# Patient Record
Sex: Female | Born: 1980 | Race: Black or African American | Hispanic: No | Marital: Single | Smoking: Never smoker
Health system: Southern US, Community
[De-identification: ages and names within clinical notes are randomized; demographics above are authoritative.]

## PROBLEM LIST (undated history)

## (undated) DIAGNOSIS — N200 Calculus of kidney: Secondary | ICD-10-CM

## (undated) DIAGNOSIS — D649 Anemia, unspecified: Secondary | ICD-10-CM

## (undated) DIAGNOSIS — E119 Type 2 diabetes mellitus without complications: Secondary | ICD-10-CM

## (undated) HISTORY — PX: WISDOM TOOTH EXTRACTION: SHX21

---
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
## 1999-02-23 ENCOUNTER — Inpatient Hospital Stay (HOSPITAL_COMMUNITY): Admission: AD | Admit: 1999-02-23 | Discharge: 1999-02-23 | Payer: Self-pay | Admitting: Obstetrics

## 1999-03-07 ENCOUNTER — Inpatient Hospital Stay (HOSPITAL_COMMUNITY): Admission: AD | Admit: 1999-03-07 | Discharge: 1999-03-07 | Payer: Self-pay | Admitting: Obstetrics

## 1999-03-29 ENCOUNTER — Inpatient Hospital Stay (HOSPITAL_COMMUNITY): Admission: AD | Admit: 1999-03-29 | Discharge: 1999-03-29 | Payer: Self-pay | Admitting: *Deleted

## 1999-04-26 ENCOUNTER — Inpatient Hospital Stay (HOSPITAL_COMMUNITY): Admission: AD | Admit: 1999-04-26 | Discharge: 1999-04-26 | Payer: Self-pay | Admitting: *Deleted

## 2000-04-20 ENCOUNTER — Inpatient Hospital Stay (HOSPITAL_COMMUNITY): Admission: AD | Admit: 2000-04-20 | Discharge: 2000-04-20 | Payer: Self-pay | Admitting: *Deleted

## 2000-05-30 ENCOUNTER — Encounter: Payer: Self-pay | Admitting: Obstetrics and Gynecology

## 2000-05-30 ENCOUNTER — Ambulatory Visit (HOSPITAL_COMMUNITY): Admission: RE | Admit: 2000-05-30 | Discharge: 2000-05-30 | Payer: Self-pay | Admitting: Obstetrics and Gynecology

## 2000-09-26 ENCOUNTER — Observation Stay (HOSPITAL_COMMUNITY): Admission: AD | Admit: 2000-09-26 | Discharge: 2000-09-27 | Payer: Self-pay | Admitting: Obstetrics and Gynecology

## 2000-09-26 ENCOUNTER — Encounter: Payer: Self-pay | Admitting: Emergency Medicine

## 2000-09-27 ENCOUNTER — Observation Stay (HOSPITAL_COMMUNITY): Admission: AD | Admit: 2000-09-27 | Discharge: 2000-09-28 | Payer: Self-pay | Admitting: Obstetrics and Gynecology

## 2000-09-29 ENCOUNTER — Inpatient Hospital Stay (HOSPITAL_COMMUNITY): Admission: AD | Admit: 2000-09-29 | Discharge: 2000-09-29 | Payer: Self-pay | Admitting: Obstetrics and Gynecology

## 2000-10-04 ENCOUNTER — Inpatient Hospital Stay (HOSPITAL_COMMUNITY): Admission: AD | Admit: 2000-10-04 | Discharge: 2000-10-07 | Payer: Self-pay | Admitting: Obstetrics

## 2001-04-07 ENCOUNTER — Encounter: Payer: Self-pay | Admitting: Emergency Medicine

## 2001-04-07 ENCOUNTER — Emergency Department (HOSPITAL_COMMUNITY): Admission: EM | Admit: 2001-04-07 | Discharge: 2001-04-07 | Payer: Self-pay | Admitting: Emergency Medicine

## 2001-06-25 ENCOUNTER — Emergency Department (HOSPITAL_COMMUNITY): Admission: EM | Admit: 2001-06-25 | Discharge: 2001-06-25 | Payer: Self-pay | Admitting: Emergency Medicine

## 2001-07-08 ENCOUNTER — Emergency Department (HOSPITAL_COMMUNITY): Admission: EM | Admit: 2001-07-08 | Discharge: 2001-07-08 | Payer: Self-pay | Admitting: Emergency Medicine

## 2001-09-02 ENCOUNTER — Emergency Department (HOSPITAL_COMMUNITY): Admission: EM | Admit: 2001-09-02 | Discharge: 2001-09-02 | Payer: Self-pay | Admitting: Emergency Medicine

## 2001-09-05 ENCOUNTER — Emergency Department (HOSPITAL_COMMUNITY): Admission: EM | Admit: 2001-09-05 | Discharge: 2001-09-05 | Payer: Self-pay | Admitting: Emergency Medicine

## 2001-12-02 ENCOUNTER — Other Ambulatory Visit: Admission: RE | Admit: 2001-12-02 | Discharge: 2001-12-02 | Payer: Self-pay | Admitting: Obstetrics and Gynecology

## 2001-12-31 ENCOUNTER — Other Ambulatory Visit: Admission: RE | Admit: 2001-12-31 | Discharge: 2001-12-31 | Payer: Self-pay | Admitting: Obstetrics and Gynecology

## 2002-02-02 ENCOUNTER — Inpatient Hospital Stay (HOSPITAL_COMMUNITY): Admission: AD | Admit: 2002-02-02 | Discharge: 2002-02-02 | Payer: Self-pay | Admitting: Obstetrics and Gynecology

## 2002-02-18 ENCOUNTER — Inpatient Hospital Stay (HOSPITAL_COMMUNITY): Admission: AD | Admit: 2002-02-18 | Discharge: 2002-02-18 | Payer: Self-pay | Admitting: Obstetrics and Gynecology

## 2002-05-25 ENCOUNTER — Inpatient Hospital Stay (HOSPITAL_COMMUNITY): Admission: AD | Admit: 2002-05-25 | Discharge: 2002-05-25 | Payer: Self-pay | Admitting: Obstetrics and Gynecology

## 2002-06-05 ENCOUNTER — Inpatient Hospital Stay (HOSPITAL_COMMUNITY): Admission: AD | Admit: 2002-06-05 | Discharge: 2002-06-08 | Payer: Self-pay | Admitting: Obstetrics and Gynecology

## 2002-08-27 ENCOUNTER — Other Ambulatory Visit: Admission: RE | Admit: 2002-08-27 | Discharge: 2002-08-27 | Payer: Self-pay | Admitting: Obstetrics and Gynecology

## 2002-10-30 ENCOUNTER — Emergency Department (HOSPITAL_COMMUNITY): Admission: EM | Admit: 2002-10-30 | Discharge: 2002-10-30 | Payer: Self-pay | Admitting: Emergency Medicine

## 2003-02-08 ENCOUNTER — Emergency Department (HOSPITAL_COMMUNITY): Admission: EM | Admit: 2003-02-08 | Discharge: 2003-02-08 | Payer: Self-pay | Admitting: Emergency Medicine

## 2003-09-14 ENCOUNTER — Emergency Department (HOSPITAL_COMMUNITY): Admission: EM | Admit: 2003-09-14 | Discharge: 2003-09-15 | Payer: Self-pay | Admitting: Emergency Medicine

## 2003-12-07 ENCOUNTER — Emergency Department (HOSPITAL_COMMUNITY): Admission: EM | Admit: 2003-12-07 | Discharge: 2003-12-07 | Payer: Self-pay | Admitting: Emergency Medicine

## 2004-01-05 ENCOUNTER — Emergency Department (HOSPITAL_COMMUNITY): Admission: EM | Admit: 2004-01-05 | Discharge: 2004-01-06 | Payer: Self-pay | Admitting: Emergency Medicine

## 2004-01-17 ENCOUNTER — Inpatient Hospital Stay (HOSPITAL_COMMUNITY): Admission: AD | Admit: 2004-01-17 | Discharge: 2004-01-17 | Payer: Self-pay | Admitting: Obstetrics

## 2004-05-25 ENCOUNTER — Inpatient Hospital Stay (HOSPITAL_COMMUNITY): Admission: AD | Admit: 2004-05-25 | Discharge: 2004-05-25 | Payer: Self-pay | Admitting: Obstetrics

## 2004-05-31 ENCOUNTER — Inpatient Hospital Stay (HOSPITAL_COMMUNITY): Admission: RE | Admit: 2004-05-31 | Discharge: 2004-06-03 | Payer: Self-pay | Admitting: Obstetrics

## 2004-05-31 ENCOUNTER — Encounter (INDEPENDENT_AMBULATORY_CARE_PROVIDER_SITE_OTHER): Payer: Self-pay | Admitting: Specialist

## 2004-11-14 ENCOUNTER — Emergency Department (HOSPITAL_COMMUNITY): Admission: EM | Admit: 2004-11-14 | Discharge: 2004-11-14 | Payer: Self-pay | Admitting: Emergency Medicine

## 2005-01-06 ENCOUNTER — Emergency Department (HOSPITAL_COMMUNITY): Admission: EM | Admit: 2005-01-06 | Discharge: 2005-01-06 | Payer: Self-pay | Admitting: Family Medicine

## 2005-05-07 ENCOUNTER — Emergency Department (HOSPITAL_COMMUNITY): Admission: EM | Admit: 2005-05-07 | Discharge: 2005-05-07 | Payer: Self-pay | Admitting: Emergency Medicine

## 2005-06-05 ENCOUNTER — Emergency Department (HOSPITAL_COMMUNITY): Admission: EM | Admit: 2005-06-05 | Discharge: 2005-06-05 | Payer: Self-pay | Admitting: Emergency Medicine

## 2005-07-09 ENCOUNTER — Emergency Department (HOSPITAL_COMMUNITY): Admission: EM | Admit: 2005-07-09 | Discharge: 2005-07-09 | Payer: Self-pay | Admitting: Emergency Medicine

## 2006-01-10 ENCOUNTER — Emergency Department (HOSPITAL_COMMUNITY): Admission: EM | Admit: 2006-01-10 | Discharge: 2006-01-10 | Payer: Self-pay | Admitting: Emergency Medicine

## 2006-05-04 ENCOUNTER — Emergency Department (HOSPITAL_COMMUNITY): Admission: EM | Admit: 2006-05-04 | Discharge: 2006-05-04 | Payer: Self-pay | Admitting: Emergency Medicine

## 2006-09-18 ENCOUNTER — Emergency Department (HOSPITAL_COMMUNITY): Admission: EM | Admit: 2006-09-18 | Discharge: 2006-09-18 | Payer: Self-pay | Admitting: Emergency Medicine

## 2006-11-26 ENCOUNTER — Emergency Department (HOSPITAL_COMMUNITY): Admission: EM | Admit: 2006-11-26 | Discharge: 2006-11-26 | Payer: Self-pay | Admitting: Emergency Medicine

## 2006-12-13 ENCOUNTER — Emergency Department (HOSPITAL_COMMUNITY): Admission: EM | Admit: 2006-12-13 | Discharge: 2006-12-13 | Payer: Self-pay | Admitting: Emergency Medicine

## 2007-08-09 ENCOUNTER — Emergency Department (HOSPITAL_COMMUNITY): Admission: EM | Admit: 2007-08-09 | Discharge: 2007-08-09 | Payer: Self-pay | Admitting: Emergency Medicine

## 2008-05-12 ENCOUNTER — Emergency Department (HOSPITAL_COMMUNITY): Admission: EM | Admit: 2008-05-12 | Discharge: 2008-05-12 | Payer: Self-pay | Admitting: Emergency Medicine

## 2008-05-26 ENCOUNTER — Encounter: Admission: RE | Admit: 2008-05-26 | Discharge: 2008-05-26 | Payer: Self-pay | Admitting: General Surgery

## 2009-02-10 ENCOUNTER — Emergency Department (HOSPITAL_COMMUNITY): Admission: EM | Admit: 2009-02-10 | Discharge: 2009-02-10 | Payer: Self-pay | Admitting: Emergency Medicine

## 2009-08-10 ENCOUNTER — Emergency Department (HOSPITAL_COMMUNITY): Admission: EM | Admit: 2009-08-10 | Discharge: 2009-08-10 | Payer: Self-pay | Admitting: Emergency Medicine

## 2010-06-14 ENCOUNTER — Emergency Department (HOSPITAL_COMMUNITY)
Admission: EM | Admit: 2010-06-14 | Discharge: 2010-06-14 | Disposition: A | Discharge status: Home / Self Care | Payer: Medicaid Other | Attending: Emergency Medicine | Admitting: Emergency Medicine

## 2010-06-14 ENCOUNTER — Emergency Department (HOSPITAL_COMMUNITY): Payer: Medicaid Other

## 2010-06-14 DIAGNOSIS — R079 Chest pain, unspecified: Secondary | ICD-10-CM | POA: Insufficient documentation

## 2010-06-14 DIAGNOSIS — R51 Headache: Secondary | ICD-10-CM | POA: Insufficient documentation

## 2010-06-14 LAB — URINALYSIS, ROUTINE W REFLEX MICROSCOPIC
Glucose, UA: NEGATIVE mg/dL
Hgb urine dipstick: NEGATIVE
Ketones, ur: NEGATIVE mg/dL
Protein, ur: NEGATIVE mg/dL
Specific Gravity, Urine: 1.027 (ref 1.005–1.030)
Urobilinogen, UA: 0.2 mg/dL (ref 0.0–1.0)
pH: 6 (ref 5.0–8.0)

## 2010-06-14 LAB — CBC
MCHC: 31.6 g/dL (ref 30.0–36.0)
RBC: 5.55 MIL/uL — ABNORMAL HIGH (ref 3.87–5.11)
WBC: 8.5 10*3/uL (ref 4.0–10.5)

## 2010-06-14 LAB — DIFFERENTIAL
Basophils Absolute: 0 10*3/uL (ref 0.0–0.1)
Eosinophils Absolute: 0.8 10*3/uL — ABNORMAL HIGH (ref 0.0–0.7)
Eosinophils Relative: 9 % — ABNORMAL HIGH (ref 0–5)
Lymphocytes Relative: 36 % (ref 12–46)
Neutrophils Relative %: 49 % (ref 43–77)

## 2010-06-14 LAB — BASIC METABOLIC PANEL
BUN: 8 mg/dL (ref 6–23)
Creatinine, Ser: 0.64 mg/dL (ref 0.4–1.2)
GFR calc Af Amer: >60 mL/min (ref >60)
GFR calc non Af Amer: >60 mL/min (ref >60)

## 2010-06-14 LAB — POCT PREGNANCY, URINE: Preg Test, Ur: NEGATIVE

## 2010-06-14 LAB — D-DIMER, QUANTITATIVE: D-Dimer, Quant: <0.22 ug/mL-FEU (ref 0.00–0.48)

## 2010-06-17 NOTE — Op Note (Signed)
NAMEJADEA, [name] NO.:  [id number]   MEDICAL RECORD NO.:  [id number]          PATIENT TYPE:  INP   LOCATION:  9113                          FACILITY:  [facility]   PHYSICIAN:  Kathreen Cosier, M.D.DATE OF BIRTH:  04-01-1980   DATE OF PROCEDURE:  05/31/2004  DATE OF DISCHARGE:                                 OPERATIVE REPORT   PREOPERATIVE DIAGNOSES:  [[BEGINLIST]]  [[BEGINLISTITEM indentLevel= 1]]  Previous cesarean section x2, at term.  [[ENDLISTITEM]]  [[BEGINLISTITEM indentLevel= 1]]  Multiparity.  [[ENDLISTITEM]]  [[BEGINLISTITEM indentLevel= 1]]  Desires repeat cesarean section and tubal ligation.  [[ENDLISTITEM]]  [[ENDLIST]]    ANESTHESIA:  Spinal.   SURGEON:  Kathreen Cosier, M.D.   FIRST ASSISTANT:  Charles A. Clearance Coots, M.D.   PROCEDURE:  The patient placed on the operating table in a supine position,  abdomen prepped and draped, bladder emptied with a Foley catheter.  A  transverse suprapubic incision made through the old scar, carried down to  the rectus fascia, the fascia cleaned and incised the length of the  incision.  The recti muscles were retracted laterally, peritoneum incised  longitudinally.  A transverse incision made in the visceral peritoneum above  the bladder and the bladder mobilized inferiorly.  A transverse low uterine  incision made.  The fluid was clear.  The patient delivered from the OA  position of a female, Apgar 9/9, weighing 7 pounds 5 ounces.  The team was  in attendance.  The placenta was posterior and removed manually.  The  uterine cavity cleaned with dry laps.  The uterine incision closed in one  layer with a continuous suture of #1 chromic.  Hemostasis was satisfactory.  Bladder flap reattached with 0 chromic.  The uterus well-contracted, tubes  and ovaries normal.  The right tube grasped in the midportion and a 0 plain  suture placed in the mesosalpinx below the portion of the tube within the  clamp.  This  was tied and approximately one inch of tube transected.  Procedure done in the exact fashion on the other side.  Hemostasis was  satisfactory, lap and sponge counts correct.  The abdomen closed in layers,  peritoneum with continuous suture of 0 chromic, fascia with continuous  suture of 0 Dexon, and the skin closed with subcuticular stitch of 4-0  Monocryl.  Blood loss 500 mL.  The patient tolerated the procedure well,  taken to the recovery room in good condition.      BAM/MEDQ  D:  05/31/2004  T:  05/31/2004  Job:  [id number]

## 2010-06-17 NOTE — Op Note (Signed)
[name], [name]                        ACCOUNT NO.:  [id number]   MEDICAL RECORD NO.:  [id number]                   PATIENT TYPE:  INP   LOCATION:  9117                                 FACILITY:  [facility]   PHYSICIAN:  Hal Morales, M.D.             DATE OF BIRTH:  02/26/1980   DATE OF PROCEDURE:  06/05/2002  DATE OF DISCHARGE:                                 OPERATIVE REPORT   PREOPERATIVE DIAGNOSES:  1. Intrauterine pregnancy at term.  2. Prior cesarean section, desire for repeat cesarean.   POSTOPERATIVE DIAGNOSES:  1. Intrauterine pregnancy at term.  2. Prior cesarean section, desire for repeat cesarean.   PROCEDURE:  Repeat low transverse cesarean section.   SURGEON:  Hal Morales, M.D.   ASSISTANT:  Concha Pyo. Duplantis, C.N.M.   ANESTHESIA:  Spinal.   ESTIMATED BLOOD LOSS:  750 mL.   COMPLICATIONS:  None.   FINDINGS:  The uterus, tubes, and ovaries were normal for the gravid state.  The patient was delivered of a female infant whose name is Tyra, weighing 6  pounds 12 ounces, with Apgars of 8 and 9 at one and five minutes,  respectively.   DESCRIPTION OF PROCEDURE:  The patient was taken to the operating room after  appropriate identification and placed on the operating table.  After  placement of a spinal anesthetic, she was placed in the supine position with  a left lateral tilt.  The abdomen and perineum were prepped with multiple  layers of Betadine.  A Foley catheter was inserted into the bladder and  connected to straight drainage.  The abdomen was draped as a sterile field.  A transverse incision was made at the site of the previous cesarean section  incision and the abdomen opened in layers.  The peritoneum was entered.  The  bladder blade was placed.  The uterine serosa was incised at the level of  the bladder flap and the bladder flap developed.  The uterus was incised in  the midline and that incision taken laterally on either side  bluntly.  The  infant was delivered from the occiput transverse position and after having  the nares and pharynx suctioned and the cord clamped and cut was handed off  to the awaiting pediatricians.  The appropriate cord blood was drawn and the  placenta noted to have separated from the uterus and was removed from the  operative field.  The uterine incision was closed with a running  interlocking suture of 0 Vicryl.  An imbricating suture of 0 Vicryl was then  placed.  Copious irrigation was carried out and hemostasis noted to be  adequate.  The abdominal peritoneum was closed with a running suture of 2-0  Vicryl.  The rectus muscles were reapproximated with a figure-of-eight  suture of 2-0 Vicryl.  The rectus fascia was closed with a running suture of  0 Vicryl, then reinforced on either side  of midline with figure-of-eight  sutures of 0 Vicryl.  The subcutaneous tissue was irrigated and made  hemostatic with Bovie cautery.  A subcuticular suture of 3-0 Monocryl was  then placed in the skin incision and a  sterile pressure dressing applied.  The patient was taken from the operating  room to the recovery room in satisfactory condition, having tolerated the  procedure well with sponge and instrument counts correct.  The infant went  to the full-term nursery.                                               Hal Morales, M.D.    VPH/MEDQ  D:  06/05/2002  T:  06/05/2002  Job:  [id number]
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
## 2010-06-17 NOTE — Discharge Summary (Signed)
[name], ETHERINGTON NO.:  [id number]   MEDICAL RECORD NO.:  [id number]          PATIENT TYPE:  INP   LOCATION:  9113                          FACILITY:  [facility]   PHYSICIAN:  Kathreen Cosier, M.D.DATE OF BIRTH:  Sep 09, 1980   DATE OF ADMISSION:  05/31/2004  DATE OF DISCHARGE:  06/03/2004                                 DISCHARGE SUMMARY   The patient is a 30 year old, gravida 5, para 2-0-2-2 with an EDC of Jun 07, 2004.  She has had two previous C-sections and she was now in for repeat  section and bilateral tubal ligation at term.  She now had a repeat low  transverse cesarean section of a female, Apgar's of 9 and 9, weighing 7  pounds 5 ounces.  The patient also had a bilateral tubal ligation performed.  On admission, her hemoglobin was 9.6, platelets 333, white count 7.8.  Sodium 139, potassium 3.7, chloride 107.  PT and PTT normal.  Urinalysis  negative.  Postoperatively, her hemoglobin was 7.8, platelets 261, white  count 9.3.  She did well and was discharged home on the third postoperative  day, ambulatory, on a regular diet to see me in six weeks.   DISCHARGE DIAGNOSIS:  Status post repeat low transverse cesarean section and  bilateral tubal ligation at term.      BAM/MEDQ  D:  06/22/2004  T:  06/22/2004  Job:  [id number]

## 2010-06-17 NOTE — Discharge Summary (Signed)
   NAMECHRISTYANA, [name]                        ACCOUNT NO.:  [id number]   MEDICAL RECORD NO.:  [id number]                   PATIENT TYPE:  INP   LOCATION:  9117                                 [facility]ACILITY[facility]   PHYSICIAN:  Rica Koyanagi, C.N.M.         DATE OF BIRTH:  Nov 19, 1980   DATE OF ADMISSION:  06/05/2002  DATE OF DISCHARGE:  06/08/2002                                 DISCHARGE SUMMARY   ADMISSION DIAGNOSIS:  Intrauterine pregnancy at term, prior C-section,  desires repeat.   PROCEDURE:  Repeat low transverse Cesarean section.   DISCHARGE DIAGNOSIS:  1. Intrauterine pregnancy at term, prior C-section, desires repeat.  2. __________ .  3. Anemia.   HISTORY OF PRESENT ILLNESS:  [name] is a 30 year old gravida 3, para 1-0-  1-1, at [redacted] weeks gestation for repeat Cesarean section which was performed  on 06/05/2002 by Dr. Dierdre Forth with the birth of a 6 pound 12 ounce  female infant, named Tyra, with Apgar scores of 8 at one minute and 9 at 5  minutes.  [name]'s postoperative course was complicated by postpartum  hemorrhage.  She received Methergine 0.2 mg I.M.  She continues with bright  heavy bleeding.  Manual expression of uterus removed 1,000 cc of blood clots  from the vagina and lower uterine segment.  The patient was then medicated  with Cytotec 1,000 micrograms after which her bleeding normalized.  The  fundus remained firm.  She continued on Methergine q.6 h.  Her hemoglobin on  the first postoperative day was 6.1 and on the second postoperative day was  5.4.  Her vital signs have remained stable.  The patient was asymptomatic.  She has been counseled twice on blood transfusion and has declined.  She  feels that she is in satisfactory condition for discharge.  Otherwise, her  physical exam is normal.  Her incision is clean and dry.  Sutures are  intact.  Thus, she is judged to be satisfactory for discharge.   DISCHARGE INSTRUCTIONS:  Per [facility] handout.   DISCHARGE MEDICATIONS:  Motrin 600 mg p.o. q.6 h. p.r.n. pain, Tylox one to  two p.o. q.3-4h for pain, iron supplement one to two times per day, and  prenatal vitamins.  She will be medicated with Depo Provera 150 mg prior to  discharge for contraception and followup will be at [facility] in 6 weeks.                                                Rica Koyanagi, C.N.M.    SDM/MEDQ  D:  06/08/2002  T:  06/08/2002  Job:  [id number]

## 2010-06-17 NOTE — Discharge Summary (Signed)
[facility]  Patient:    [name], [name] Visit Number: [id number] MRN: [id number]          Service Type: OBS Location: 910A 9133 01 Attending Physician:  Shaune Spittle Dictated by:   Mack Guise, C.N.M. Proc. Date: 10/04/00 Admit Date:  10/04/2000 Discharge Date: 10/07/2000                             Discharge Summary  HOSPITAL COURSE:              [name] is a 30 year old, gravida 2, para 0-0-1-0 at 39-5/7 weeks who presented in early labor at 4 cm. Her labor became arrested at 6 cm of dilation and despite the use of Pitocin and despite adequate labor, she did not progress and therefore the decision was made for cesarean delivery. She underwent primary low transverse cesarean delivery by Dr. Dierdre Forth with the birth of a 7-pound 9-ounce female infant named Tyree with Apgar scores of 9 at one minute and 9 at five minutes. She has done well in the postoperative period. Her hemoglobin on the first postoperative day was 9.6. She was treated postoperatively for Trichomonas which was found on her urine on delivery and on this, her third postoperative day, she is judged to be in satisfactory condition for discharge.  ADMITTING DIAGNOSES:          1. Intrauterine pregnancy at term.                               2. Positive group B streptococcus.                               3. Positive Trichomonas.  PROCEDURE:                    Primary low transverse cesarean delivery.  DISCHARGE DIAGNOSES:          1. Intrauterine pregnancy at term.                               2. Positive group B streptococcus.                               3. Positive Trichomonas.                               4. Primary low transverse cesarean delivery.                               6. Arrested active phase of labor.  DISCHARGE INSTRUCTIONS:       Instructions are per [facility] handout.  DISCHARGE MEDICATIONS:        1. Motrin 600 mg  p.o. q.6h. p.r.n. pain.                               2. Percocet one to two p.o. q.3-4h. p.r.n.  pain.                               3. Depo-Provera prior to discharge.                               4. Prenatal vitamins.  DISCHARGE FOLLOWUP:           The patient is to follow up at [facility] in six weeks. Dictated by:   Mack Guise, C.N.M. Attending Physician:  Shaune Spittle DD:  10/07/00 TD:  10/07/00 Job: [id number]
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
## 2010-06-17 NOTE — Discharge Summary (Signed)
NAME:  [name], [name] NO.:  [id number]   MEDICAL RECORD NO.:  [id number]                   PATIENT TYPE:   LOCATION:                                       FACILITY:   PHYSICIAN:  Hal Morales, M.D.             DATE OF BIRTH:   DATE OF ADMISSION:  06/05/2002  DATE OF DISCHARGE:                                 DISCHARGE SUMMARY   REASON FOR ADMISSION:  The patient is a 30 year old single black female  Gravida III, Para 1-0-1-1 at [redacted] weeks gestation admitted for repeat cesarean  section.  She denies any regular uterine contractions.  She denies any  nausea, vomiting, headache or visual disturbances.  Her pregnancy has been  followed at [facility] by the certified nurse midwife service  and has been essentially uncomplicated though at risk for a history of  anemia, a history of abnormal Pap requiring colposcopy postpartum and a  history of previous low transverse cesarean section desiring repeat cesarean  section.   OBGYN HISTORY:  She is a Gravida III, Para 1-0-1-1 who had a  miscarriage in  February of 2001 with no complications.  In September 2002 she delivered a  viable female infant who weighed 7 pounds and 9 ounces at 39 1/2 weeks by  cesarean section secondary to failure to progress.  She dilated to 6 cm, but  did not continue to dilate after that.  Her other GYN history is that she  used Depo-Provera for contraception and conceived when she was two months  late for getting her next shot.  She has had a history of Chlamydia in the  past with the last time in 2002.   ALLERGIES:  She has no known drug allergies.   PAST MEDICAL HISTORY:  1. She reports having had the usual childhood diseases.  2. History of anemia.  3. No other medical problems.   FAMILY HISTORY:  Significant for a maternal aunt and mother with chronic  hypertension, a cousin with asthma and diabetes requiring p.o. medication, a  maternal grandmother and aunt  with breast cancer in their forties.   GENETIC HISTORY:  Negative.   SOCIAL HISTORY:  She is single.  The father of the baby is Rush Farmer  and he is involved and supportive.  They are both employed full time.  They  are of the [facility] faith.  They deny any illicit drug use, alcohol or  smoking with this pregnancy.   PRENATAL LABORATORIES:  Blood type is O positive, antibody screen negative,  sickle cell trait is negative, hemoglobin electrophoresis negative, syphilis  is non-reactive, Rubella is postive, hepatitis B surface antigen is  negative, cystic fibrosis is negative, gonorrhea and Chlamydia are both  negative in October 2003, Pap had low grade SIL in November 2003 and she had  a colposcopy postpartum.  Her one hour Glucola was within normal range.  Her  36  week Beta Strep was negative as were gonorrhea and Chlamydia at 36 weeks.   PHYSICAL EXAMINATION:  VITAL SIGNS:  Her vital signs are stable and she is  afebrile.  HEENT:  Grossly within normal limits.  HEART:  Regular rate and rhythm.  CHEST:  Clear.  BREASTS:  Soft and non-tender.  ABDOMEN:  Gravid with irregular contractions.  Fetal heart rate is  reassuring.  PELVIC EXAM: Deferred.   ASSESSMENT:  1. Intrauterine pregnancy at term.  2. Previous cesarean section and desires repeat cesarean section.   PLAN:  Admit to [facility] for a repeat cesarean section per Dr.  Dierdre Forth.     Concha Pyo. Duplantis, C.N.M.              Hal Morales, M.D.    SJD/MEDQ  D:  06/03/2002  T:  06/03/2002  Job:  [id number]

## 2010-07-01 HISTORY — PX: TUBAL LIGATION: SHX77

## 2010-07-30 ENCOUNTER — Emergency Department (HOSPITAL_COMMUNITY)
Admission: EM | Admit: 2010-07-30 | Discharge: 2010-07-31 | Disposition: A | Discharge status: Home / Self Care | Payer: Medicaid Other | Attending: Emergency Medicine | Admitting: Emergency Medicine

## 2010-07-30 ENCOUNTER — Emergency Department (HOSPITAL_COMMUNITY): Payer: Medicaid Other

## 2010-07-30 DIAGNOSIS — N949 Unspecified condition associated with female genital organs and menstrual cycle: Secondary | ICD-10-CM | POA: Insufficient documentation

## 2010-07-30 DIAGNOSIS — N898 Other specified noninflammatory disorders of vagina: Secondary | ICD-10-CM | POA: Insufficient documentation

## 2010-07-30 LAB — URINALYSIS, ROUTINE W REFLEX MICROSCOPIC
Glucose, UA: NEGATIVE mg/dL
Hgb urine dipstick: NEGATIVE
Ketones, ur: 15 mg/dL — AB
Leukocytes, UA: NEGATIVE
Specific Gravity, Urine: 1.025 (ref 1.005–1.030)
Urobilinogen, UA: 0.2 mg/dL (ref 0.0–1.0)
pH: 6 (ref 5.0–8.0)

## 2010-07-30 LAB — POCT PREGNANCY, URINE: Preg Test, Ur: POSITIVE

## 2010-07-30 LAB — WET PREP, GENITAL: Yeast Wet Prep HPF POC: NONE SEEN

## 2010-07-31 LAB — POCT I-STAT, CHEM 8
BUN: 6 mg/dL (ref 6–23)
Calcium, Ion: 0.96 mmol/L — ABNORMAL LOW (ref 1.12–1.32)
Chloride: 106 mEq/L (ref 96–112)
Creatinine, Ser: 0.8 mg/dL (ref 0.50–1.10)
Glucose, Bld: 88 mg/dL (ref 70–99)
HCT: 42 % (ref 36.0–46.0)
Hemoglobin: 14.3 g/dL (ref 12.0–15.0)
TCO2: 21 mmol/L (ref 0–100)

## 2010-07-31 LAB — CBC
Hemoglobin: 12.1 g/dL (ref 12.0–15.0)
MCH: 22.7 pg — ABNORMAL LOW (ref 26.0–34.0)
Platelets: 312 10*3/uL (ref 150–400)
RDW: 13.4 % (ref 11.5–15.5)

## 2010-08-02 LAB — GC/CHLAMYDIA PROBE AMP, GENITAL: Chlamydia, DNA Probe: NEGATIVE

## 2010-08-04 ENCOUNTER — Other Ambulatory Visit: Payer: Self-pay | Admitting: Obstetrics & Gynecology

## 2010-08-04 ENCOUNTER — Inpatient Hospital Stay (HOSPITAL_COMMUNITY): Payer: Medicaid Other

## 2010-08-04 ENCOUNTER — Ambulatory Visit (HOSPITAL_COMMUNITY)
Admission: AD | Admit: 2010-08-04 | Discharge: 2010-08-04 | Disposition: A | Discharge status: Home / Self Care | Payer: Medicaid Other | Source: Ambulatory Visit | Attending: Obstetrics & Gynecology | Admitting: Obstetrics & Gynecology

## 2010-08-04 DIAGNOSIS — R52 Pain, unspecified: Secondary | ICD-10-CM

## 2010-08-04 DIAGNOSIS — O00109 Unspecified tubal pregnancy without intrauterine pregnancy: Secondary | ICD-10-CM | POA: Insufficient documentation

## 2010-08-04 LAB — CBC
HCT: 35.4 % — ABNORMAL LOW (ref 36.0–46.0)
MCHC: 31.6 g/dL (ref 30.0–36.0)
Platelets: 299 10*3/uL (ref 150–400)
RDW: 13.5 % (ref 11.5–15.5)
WBC: 10.6 10*3/uL — ABNORMAL HIGH (ref 4.0–10.5)

## 2010-08-04 LAB — URINALYSIS, ROUTINE W REFLEX MICROSCOPIC
Glucose, UA: NEGATIVE mg/dL
Ketones, ur: NEGATIVE mg/dL
Leukocytes, UA: NEGATIVE
Nitrite: NEGATIVE
Protein, ur: NEGATIVE mg/dL
Urobilinogen, UA: 0.2 mg/dL (ref 0.0–1.0)

## 2010-08-04 LAB — HCG, QUANTITATIVE, PREGNANCY: hCG, Beta Chain, Quant, S: 425 m[IU]/mL — ABNORMAL HIGH (ref <5)

## 2010-08-04 LAB — TYPE AND SCREEN
ABO/RH(D): O POS
Antibody Screen: NEGATIVE

## 2010-08-25 ENCOUNTER — Ambulatory Visit: Payer: Self-pay | Admitting: Family Medicine

## 2010-09-02 NOTE — H&P (Signed)
NAMEWILMER, BERRYHILL NO.:  [id number]  MEDICAL RECORD NO.:  [id number]  LOCATION:  [facility]                          FACILITY:  [facility]  PHYSICIAN:  Pieter Partridge, MD   DATE OF BIRTH:  21-Nov-1980  DATE OF ADMISSION:  08/04/2010 DATE OF DISCHARGE:                             HISTORY & PHYSICAL   ADMITTING DIAGNOSIS:  Left ruptured ectopic pregnancy.  ANTICIPATED PROCEDURE:  Diagnostic laparoscopy with salpingectomy if needed.  HISTORY OF PRESENT ILLNESS:  [name] is a 30 year old gravida 6 with para 3-0-2-3 at approximately 3 weeks by last menstrual period.  She presented less than 1 week ago to the hospital with a complaint of lower abdominal pain.  Urine pregnancy test was done at that time, which was positive and ultrasound was also done and there was no intrauterine pregnancy noted.  The patient was given follow up instructions to return within a week.  She presented earlier because the pain worsened and was unbearable.  She reports that it was painful at rest, bringing her to tears.  She found it difficult to get comfortable.  She could not lay on her left side without the pain being excruciating right side with a little bit more tolerable.  She denies any vaginal bleeding.  She does not report any fever, chills, nausea, vomiting, or diarrhea.  Her quant prior to today was noted to be low and consistent with no obvious IUP.  No other outstanding laboratory work was noted per patient history.  She reports that she last ate a meal at 10 a.m. and the time of this dictation is about 3 o'clock p.m.  ALLERGIES:  The patient denies any allergies.  MEDICATIONS:  No daily medications, but she did take Tylenol for pain at the time of this episode.  PAST MEDICAL HISTORY:  Negative.  PAST SURGICAL HISTORY:  She has had three previous C-section and tubal ligation at the time of C-section.  OB HISTORY:  She has had three full-term deliveries.  All  three delivered by C-section.  The first was delivered because "her babies got stuck in her pelvis."  She has had one spontaneous abortion and one elective abortion.  GYN HISTORY:  She does not report any abnormal Pap smears.  She does report having Chlamydia which was diagnosed at the time she was pregnant.  FAMILY HISTORY:  Remarkable for diabetes and hypertension.  SOCIAL HISTORY:  The patient is a CNA.  She denies any alcohol, tobacco, or drug use.  PHYSICAL EXAMINATION:  VITAL SIGNS:  Temperature 98.2, blood pressure is 128/73, heart rate is 94, respirations 20. GENERAL:  The patient is in no acute distress, alert, and oriented.  She appears very comfortable, able to give a good history. CARDIOVASCULAR:  Regular rate and rhythm. LUNGS:  Clear to auscultation bilaterally.  No wheezing, rhonchi, or rales. BREASTS:  Deferred. ABDOMEN:  Soft, slightly distended, but not tympanic.  Active bowel sounds in all four quadrants.  She has very, very slight rebound tenderness. PELVIC:  Deferred until in OR. EXTREMITIES:  No edema.  No calf tenderness noted.  Ultrasound findings report no intrauterine pregnancy, an adnexal mass of approximately 2 cm with a 5-cm  mass around it which is suspicious for a clot.  Uterus appears to be normal.  Laboratory studies available at the time of this dictation approximately 400 quant.  No CBC as of yet, type and screen also is not available.  ASSESSMENT:  This is a 30 year old gravida 6, para 3-0-2-3 with a presumed ruptured ectopic pregnancy.  PLAN:  To proceed with diagnostic laparoscopy and salpingectomy as needed depending on where the pregnancy is.  I will assess also the right tube just to make sure that there is no issue, cauterize as necessary just to make sure that they remained unclosed.  The patient was counseled at length on risks, benefits, and alternatives of procedure.  She desires to proceed.  She does not want any  more pregnancies, very clear on that.  She was also consented with her mother present and a PA student.     Pieter Partridge, MD     EBV/MEDQ  D:  08/04/2010  T:  08/05/2010  Job:  [id number]  Electronically Signed by Shelby Rankins MD on 09/02/2010 10:15:24 PM

## 2010-09-02 NOTE — Op Note (Signed)
NAMELEXXI, KOSLOW NO.:  [id number]  MEDICAL RECORD NO.:  [id number]  LOCATION:  [facility]                          FACILITY:  [facility]  PHYSICIAN:  Pieter Partridge, MD   DATE OF BIRTH:  Dec 02, 1980  DATE OF PROCEDURE:  08/04/2010 DATE OF DISCHARGE:                              OPERATIVE REPORT   PREOPERATIVE DIAGNOSIS:  Left ruptured ectopic pregnancy.  POSTOPERATIVE DIAGNOSIS:  Left ruptured ectopic pregnancy.  PROCEDURE:  Diagnostic laparoscopy, left salpingectomy, right tubal ligation via coagulation.  SURGEON:  Pieter Partridge, MD  ASSISTANT:  Technician.  ANESTHESIA:  General.  FINDINGS:  Hemoperitoneum of approximately 100 mL clotted, left ruptured ectopic from left fallopian tube near the fimbriae, previous right tubal ligation was not obvious.  ANESTHESIA:  General and local.  SPECIMEN:  Left fallopian tube and clot.  DISPOSITION:  To Path.  ESTIMATED BLOOD LOSS:  150.Marland Kitchen  URINE OUTPUT:  100 mL.  IV FLUIDS:  1800.  COMPLICATIONS:  None.  Patient to PACU in good condition.  INDICATIONS:  Ms. Valjean Ruppel is a 30 year old gravida 6, para 3-0-2- 3, at approximately 3 weeks' pregnant by last menstrual period, presented with an onset of severe abdominal pain, worse in the left lower quadrant.  She had a previous positive pregnancy test with no intrauterine pregnancy noted.  Her quantitative beta-HCG today was 425 and on ultrasound was noted to have a left adnexal mass with 5-cm clot which was suspicious for ruptured ectopic.  She was counseled on the need for exploratory surgery and removal of fallopian tube or organ that was damaged and now bleeding due to the ectopic pregnancy.  She was counseled on risks, benefits and alternatives, and all questions were answered.  PROCEDURE IN DETAIL:  Ms. Patchen was identified in the holding area. She was then taken to the operating room.  She underwent general endotracheal anesthesia without  complication.  She was then prepped and draped in the normal sterile fashion after she was placed in the dorsal lithotomy position.  While she was in the dorsal lithotomy position prior to prepping, I did examine under anesthesia which revealed a 10- week-sized uterus, some left adnexal fullness on the left, but no nothing firm.  Cervix was closed.  No bleeding noted.  After the patient was prepped, a Hulka uterine manipulator was placed after a single-tooth tenaculum was used to grasp the anterior lip of the cervix.  Anteverted uterus was noted.  The single-tooth tenaculum was removed and Foley catheter was placed under sterile technique.  A small infraumbilical incision was then made after 0.25% Marcaine was used at all in all port sites prior to incision.  A small infraumbilical incision was made with the scalpel and a 10-mm trocar was then advanced through the abdomen while the patient was in Trendelenburg.  Intra- abdominal access was confirmed via laparoscopy and at that time blood in the peritoneum was noted.  Abdomen was insufflated with CO2 gas.  The abdomen was briefly explored with the findings noted.  She did have a midline omental adhesion that was noted which remained in place during the procedure.  Two other ports were placed, one was right of midline and  the other was on the left-hand side, and that was done under direct visualization.  The fallopian tube was then grasped with atraumatic grasper and then a gyrus was then used to transect the fallopian tube off the ovarian fossa.  Prior to removing the tube, I wanted to make sure that that was where the ectopic head had ruptured.  I did a thorough exploration of both tubes and the cul-de-sac.  There was some oozing from the left fallopian tube which was indicative of the site, so the gyrus was used to transect the fallopian tube off the ovary.  An EndoCatch bag was then used and the tube was removed from the abdomen  through the 10-mm port at the umbilicus.  I handed off to the technician.  The right side was then examined and the previous tubal ligation was not very obvious; however, there was did appear to be a small disruption of the mesosalpinx, so for that reason the tube was called cauterized with the gyrus to make sure that there were no other ectopic pregnancies.  The upper abdomen was explored.  There was no signs of Fitz-Hugh-Curtis.  No obvious gross abnormalities besides the omental adhesions.  During the procedure once we retrieved the fallopian tube, the camera was switched from the umbilicus to the right lower quadrant.  Copious irrigation was performed to get all blood out of the belly.  The clot was removed with the large atraumatic grasper and the clot was removed with the suction irrigator.  The patient was placed in reverse Trendelenburg to make sure as much of the blood was removed from the abdominal cavity.  The patient tolerated the procedure well.  At the end of the procedure, the Hulka uterine manipulator was removed.  I could not see the full portion of the cervix very well due to the vaginal wall, but however, there was no active bleeding noted.  Foley catheter was to be removed in the OR.  Again, the patient tolerated the procedure well.  She had SCDs in place.  She received Ancef 1 gram prior to the surgery.  She was also gonorrhea positive and she will receive Rocephin prior to discharge on same day.  She received Ancef 1 gram prior to the procedure.  All instrument, sponge and needle counts were correct x3 negative.     Pieter Partridge, MD     EBV/MEDQ  D:  08/04/2010  T:  08/05/2010  Job:  [id number]  Electronically Signed by Geryl Rankins MD on 09/02/2010 10:16:09 PM

## 2010-10-27 LAB — URINALYSIS, ROUTINE W REFLEX MICROSCOPIC
Bilirubin Urine: NEGATIVE
Glucose, UA: NEGATIVE
Hgb urine dipstick: NEGATIVE
Ketones, ur: NEGATIVE
Nitrite: NEGATIVE
Specific Gravity, Urine: 1.02
pH: 6.5

## 2010-10-27 LAB — RPR: RPR Ser Ql: NONREACTIVE

## 2010-10-27 LAB — WET PREP, GENITAL
WBC, Wet Prep HPF POC: NONE SEEN
Yeast Wet Prep HPF POC: NONE SEEN

## 2010-10-27 LAB — URINE MICROSCOPIC-ADD ON

## 2010-10-27 LAB — GC/CHLAMYDIA PROBE AMP, GENITAL: GC Probe Amp, Genital: POSITIVE — AB

## 2010-10-27 LAB — POCT PREGNANCY, URINE: Operator id: 294501

## 2010-11-09 LAB — URINALYSIS, ROUTINE W REFLEX MICROSCOPIC
Bilirubin Urine: NEGATIVE
Glucose, UA: NEGATIVE
Nitrite: NEGATIVE
Specific Gravity, Urine: 1.025
pH: 7

## 2010-11-09 LAB — POCT PREGNANCY, URINE: Operator id: 198171

## 2011-06-18 ENCOUNTER — Inpatient Hospital Stay (HOSPITAL_COMMUNITY): Payer: Medicaid Other

## 2011-06-18 ENCOUNTER — Encounter (HOSPITAL_COMMUNITY): Payer: Self-pay | Admitting: *Deleted

## 2011-06-18 ENCOUNTER — Inpatient Hospital Stay (HOSPITAL_COMMUNITY)
Admission: AD | Admit: 2011-06-18 | Discharge: 2011-06-18 | Disposition: A | Discharge status: Home / Self Care | Payer: Medicaid Other | Source: Ambulatory Visit | Attending: Obstetrics and Gynecology | Admitting: Obstetrics and Gynecology

## 2011-06-18 DIAGNOSIS — R1032 Left lower quadrant pain: Secondary | ICD-10-CM | POA: Insufficient documentation

## 2011-06-18 DIAGNOSIS — R109 Unspecified abdominal pain: Secondary | ICD-10-CM

## 2011-06-18 HISTORY — DX: Anemia, unspecified: D64.9

## 2011-06-18 LAB — WET PREP, GENITAL: Trich, Wet Prep: NONE SEEN

## 2011-06-18 LAB — COMPREHENSIVE METABOLIC PANEL
ALT: 9 U/L (ref 0–35)
AST: 14 U/L (ref 0–37)
Albumin: 3.6 g/dL (ref 3.5–5.2)
Calcium: 9.2 mg/dL (ref 8.4–10.5)
GFR calc Af Amer: >90 mL/min (ref >90)
Sodium: 135 mEq/L (ref 135–145)
Total Protein: 7.4 g/dL (ref 6.0–8.3)

## 2011-06-18 LAB — CBC
MCH: 21.9 pg — ABNORMAL LOW (ref 26.0–34.0)
MCHC: 31.9 g/dL (ref 30.0–36.0)
Platelets: 344 10*3/uL (ref 150–400)
RDW: 14 % (ref 11.5–15.5)

## 2011-06-18 LAB — URINALYSIS, ROUTINE W REFLEX MICROSCOPIC
Ketones, ur: NEGATIVE mg/dL
Nitrite: NEGATIVE
Protein, ur: NEGATIVE mg/dL

## 2011-06-18 LAB — URINE MICROSCOPIC-ADD ON

## 2011-06-18 LAB — POCT PREGNANCY, URINE: Preg Test, Ur: NEGATIVE

## 2011-06-18 MED ORDER — KETOROLAC TROMETHAMINE 60 MG/2ML IM SOLN
60.0000 mg | Freq: Once | INTRAMUSCULAR | Status: AC
  Administered 2011-06-18: 60 mg via INTRAMUSCULAR
  Filled 2011-06-18: qty 2

## 2011-06-18 NOTE — Discharge Instructions (Signed)
Drink at least 8 8-oz glasses of water every day. Eat a high fiber diet.  See a doctor if your symptoms worsen.

## 2011-06-18 NOTE — MAU Note (Signed)
Abdominal pain and pressure on the left side x 1 week. No vaginal bleeding. LMP 06/10/11

## 2011-06-18 NOTE — MAU Provider Note (Signed)
History     CSN: [id number]  Arrival date and time: 06/18/11 1945   First Provider Initiated Contact with Patient 06/18/11 2022      Chief Complaint  Patient presents with  . Abdominal Pain   HPI [name] 31 y.o. Client is here with LLQ pain for several days.  History of ectopic pregnancy with laparoscopic surgery by Dr. Dion Body in July 2012.  Thinks the pain is related to her surgery. Having pressure when she moves, urinates, or has BM.  Unable to sleep on either side.  Can only lie on her back.  History of infrequent BM - last was today.  Has not yet made an appointment with her primary care MD - plans to seek care at [facility].  OB History    Grav Para Term Preterm Abortions TAB SAB Ect Mult Living   6 3 3  3 1 1 1  3       Past Medical History  Diagnosis Date  . Anemia   . Migraine     Past Surgical History  Procedure Date  . Cesarean section   . Wisdom tooth extraction     Family History  Problem Relation Age of Onset  . Diabetes Mother   . Hypertension Mother   . Diabetes Maternal Grandmother   . Hypertension Maternal Grandmother   . Cancer Maternal Grandmother   . Diabetes Maternal Grandfather   . Hypertension Maternal Grandfather     History  Substance Use Topics  . Smoking status: Never Smoker   . Smokeless tobacco: Not on file  . Alcohol Use: No    Allergies: No Known Allergies  Prescriptions prior to admission  Medication Sig Dispense Refill  . bisacodyl (DULCOLAX) 5 MG EC tablet Take 20 mg by mouth once.      . diphenhydramine-acetaminophen (TYLENOL PM) 25-500 MG TABS Take 2 tablets by mouth at bedtime as needed. Sleep/pain      . magnesium citrate 1.745 GM/30ML SOLN Take 1 Bottle by mouth once.        Review of Systems  Constitutional: Negative for fever.  Gastrointestinal: Positive for abdominal pain and constipation. Negative for nausea, vomiting and diarrhea.  Genitourinary: Negative for urgency and frequency.       Pressure  with urination   Physical Exam   Blood pressure 144/82, pulse 89, temperature 98.2 F (36.8 C), temperature source Oral, resp. rate 18, height 5\' 7"  (1.702 m), weight 207 lb (93.895 kg), last menstrual period 06/12/2011.  Physical Exam  Nursing note and vitals reviewed. Constitutional: She is oriented to person, place, and time. She appears well-developed and well-nourished.  HENT:  Head: Normocephalic.  Eyes: EOM are normal.  Neck: Neck supple.  GI: Soft. Bowel sounds are normal. There is tenderness. There is guarding. There is no rebound.       LLQ pain was the worst area.  Pain in other quadrants as well.  Least pain in RLQ.    Musculoskeletal: Normal range of motion.  Neurological: She is alert and oriented to person, place, and time.  Skin: Skin is warm and dry.  Psychiatric: She has a normal mood and affect.    MAU Course  Procedures Results for orders placed during the hospital encounter of 06/18/11 (from the past 24 hour(s))  WET PREP, GENITAL     Status: Abnormal   Collection Time   06/18/11  2:55 AM      Component Value Range   Yeast Wet Prep HPF POC NONE  SEEN  NONE SEEN    Trich, Wet Prep NONE SEEN  NONE SEEN    Clue Cells Wet Prep HPF POC FEW (*) NONE SEEN    WBC, Wet Prep HPF POC FEW (*) NONE SEEN   URINALYSIS, ROUTINE W REFLEX MICROSCOPIC     Status: Abnormal   Collection Time   06/18/11  8:00 PM      Component Value Range   Color, Urine YELLOW  YELLOW    APPearance CLEAR  CLEAR    Specific Gravity, Urine 1.015  1.005 - 1.030    pH 6.5  5.0 - 8.0    Glucose, UA NEGATIVE  NEGATIVE (mg/dL)   Hgb urine dipstick SMALL (*) NEGATIVE    Bilirubin Urine NEGATIVE  NEGATIVE    Ketones, ur NEGATIVE  NEGATIVE (mg/dL)   Protein, ur NEGATIVE  NEGATIVE (mg/dL)   Urobilinogen, UA 0.2  0.0 - 1.0 (mg/dL)   Nitrite NEGATIVE  NEGATIVE    Leukocytes, UA TRACE (*) NEGATIVE   URINE MICROSCOPIC-ADD ON     Status: Abnormal   Collection Time   06/18/11  8:00 PM      Component  Value Range   Squamous Epithelial / LPF FEW (*) RARE    WBC, UA 7-10  <3 (WBC/hpf)   RBC / HPF 3-6  <3 (RBC/hpf)   Bacteria, UA FEW (*) RARE   POCT PREGNANCY, URINE     Status: Normal   Collection Time   06/18/11  8:21 PM      Component Value Range   Preg Test, Ur NEGATIVE  NEGATIVE   CBC     Status: Abnormal   Collection Time   06/18/11  8:40 PM      Component Value Range   WBC 9.0  4.0 - 10.5 (K/uL)   RBC 5.34 (*) 3.87 - 5.11 (MIL/uL)   Hemoglobin 11.7 (*) 12.0 - 15.0 (g/dL)   HCT 84.6  96.2 - 95.2 (%)   MCV 68.7 (*) 78.0 - 100.0 (fL)   MCH 21.9 (*) 26.0 - 34.0 (pg)   MCHC 31.9  30.0 - 36.0 (g/dL)   RDW 84.1  32.4 - 40.1 (%)   Platelets 344  150 - 400 (K/uL)  COMPREHENSIVE METABOLIC PANEL     Status: Abnormal   Collection Time   06/18/11  8:40 PM      Component Value Range   Sodium 135  135 - 145 (mEq/L)   Potassium 3.9  3.5 - 5.1 (mEq/L)   Chloride 100  96 - 112 (mEq/L)   CO2 28  19 - 32 (mEq/L)   Glucose, Bld 90  70 - 99 (mg/dL)   BUN 7  6 - 23 (mg/dL)   Creatinine, Ser 0.27  0.50 - 1.10 (mg/dL)   Calcium 9.2  8.4 - 25.3 (mg/dL)   Total Protein 7.4  6.0 - 8.3 (g/dL)   Albumin 3.6  3.5 - 5.2 (g/dL)   AST 14  0 - 37 (U/L)   ALT 9  0 - 35 (U/L)   Alkaline Phosphatase 59  39 - 117 (U/L)   Total Bilirubin 0.2 (*) 0.3 - 1.2 (mg/dL)   GFR calc non Af Amer >90  >90 (mL/min)   GFR calc Af Amer >90  >90 (mL/min)    MDM Clinical Data: Were were left lower quadrant pain  TRANSABDOMINAL AND TRANSVAGINAL ULTRASOUND OF PELVIS  Technique: Both transabdominal and transvaginal ultrasound  examinations of the pelvis were performed. Transabdominal technique  was performed for global  imaging of the pelvis including uterus,  ovaries, adnexal regions, and pelvic cul-de-sac.  Comparison: 08/04/2010  It was necessary to proceed with endovaginal exam following the  transabdominal exam to visualize the endometrium and adnexa.  Findings:  Uterus: Heterogeneous in echogenicity, measures  up to 9.0 x 4.7 x  5.3 cm.  Endometrium: Normal in thickness and appearance, measuring up to 6  mm.  Right ovary: Normal sonographic appearance, measuring 3.0 x 2.2 x  2.3 cm.  Left ovary: Normal sonographic appearance, measuring 2.5 x 1.6 x  2.3 cm.  Other findings: No free fluid  IMPRESSION:  Mildly heterogeneous appearance to the myometrium, may reflect a  small fibroids.  Otherwise, normal pelvic ultrasound.   Assessment and Plan  Abdominal pain - unknown cause.  Pain lessened but not relieved by Toradol.  Plan Be seen by [facility] if the pain continues.   No GYN cause for pain found today. Able to return to work tomorrow - note given for today. Drink at least 8 8-oz glasses of water every day. Eat a high fiber dietl.  [name] 06/18/2011, 8:31 PM

## 2011-06-19 LAB — GC/CHLAMYDIA PROBE AMP, GENITAL: GC Probe Amp, Genital: NEGATIVE

## 2011-06-19 NOTE — MAU Provider Note (Signed)
Agree with above note.  [name] 06/19/2011 6:16 AM

## 2011-06-20 ENCOUNTER — Encounter (HOSPITAL_COMMUNITY): Payer: Self-pay | Admitting: *Deleted

## 2011-06-20 ENCOUNTER — Emergency Department (HOSPITAL_COMMUNITY): Payer: Medicaid Other

## 2011-06-20 ENCOUNTER — Emergency Department (HOSPITAL_COMMUNITY)
Admission: EM | Admit: 2011-06-20 | Discharge: 2011-06-20 | Disposition: A | Discharge status: Home / Self Care | Payer: Medicaid Other | Attending: Emergency Medicine | Admitting: Emergency Medicine

## 2011-06-20 DIAGNOSIS — R1032 Left lower quadrant pain: Secondary | ICD-10-CM | POA: Insufficient documentation

## 2011-06-20 DIAGNOSIS — N390 Urinary tract infection, site not specified: Secondary | ICD-10-CM

## 2011-06-20 LAB — URINE MICROSCOPIC-ADD ON

## 2011-06-20 LAB — URINALYSIS, ROUTINE W REFLEX MICROSCOPIC
Glucose, UA: NEGATIVE mg/dL
Protein, ur: 30 mg/dL — AB

## 2011-06-20 MED ORDER — SULFAMETHOXAZOLE-TRIMETHOPRIM 800-160 MG PO TABS: 1.0000 | ORAL_TABLET | Freq: Two times a day (BID) | ORAL | Status: AC

## 2011-06-20 MED ORDER — HYDROCODONE-ACETAMINOPHEN 5-325 MG PO TABS
2.0000 | ORAL_TABLET | Freq: Once | ORAL | Status: AC
  Administered 2011-06-20: 2 via ORAL
  Filled 2011-06-20: qty 2

## 2011-06-20 MED ORDER — OXYCODONE-ACETAMINOPHEN 5-325 MG PO TABS
1.0000 | ORAL_TABLET | Freq: Once | ORAL | Status: AC
  Administered 2011-06-20: 1 via ORAL
  Filled 2011-06-20: qty 1

## 2011-06-20 MED ORDER — ONDANSETRON 4 MG PO TBDP
ORAL_TABLET | ORAL | Status: AC
  Filled 2011-06-20: qty 1

## 2011-06-20 MED ORDER — ONDANSETRON 4 MG PO TBDP
4.0000 mg | ORAL_TABLET | Freq: Once | ORAL | Status: AC
  Administered 2011-06-20: 4 mg via ORAL

## 2011-06-20 MED ORDER — SULFAMETHOXAZOLE-TMP DS 800-160 MG PO TABS
1.0000 | ORAL_TABLET | Freq: Once | ORAL | Status: AC
  Administered 2011-06-20: 1 via ORAL
  Filled 2011-06-20: qty 1

## 2011-06-20 NOTE — ED Notes (Signed)
States her pain is 11 "it really really hurts"

## 2011-06-20 NOTE — Discharge Instructions (Signed)
Urinary Tract Infection Infections of the urinary tract can start in several places. A bladder infection (cystitis), a kidney infection (pyelonephritis), and a prostate infection (prostatitis) are different types of urinary tract infections (UTIs). They usually get better if treated with medicines (antibiotics) that kill germs. Take all the medicine until it is gone. You or your child may feel better in a few days, but TAKE ALL MEDICINE or the infection may not respond and may become more difficult to treat. HOME CARE INSTRUCTIONS   Drink enough water and fluids to keep the urine clear or pale yellow. Cranberry juice is especially recommended, in addition to large amounts of water.   Avoid caffeine, tea, and carbonated beverages. They tend to irritate the bladder.   Alcohol may irritate the prostate.   Only take over-the-counter or prescription medicines for pain, discomfort, or fever as directed by your caregiver.  To prevent further infections:  Empty the bladder often. Avoid holding urine for long periods of time.   After a bowel movement, women should cleanse from front to back. Use each tissue only once.   Empty the bladder before and after sexual intercourse.  FINDING OUT THE RESULTS OF YOUR TEST Not all test results are available during your visit. If your or your child's test results are not back during the visit, make an appointment with your caregiver to find out the results. Do not assume everything is normal if you have not heard from your caregiver or the medical facility. It is important for you to follow up on all test results. SEEK MEDICAL CARE IF:   There is back pain.   Your baby is older than 3 months with a rectal temperature of 100.5 F (38.1 C) or higher for more than 1 day.   Your or your child's problems (symptoms) are no better in 3 days. Return sooner if you or your child is getting worse.  SEEK IMMEDIATE MEDICAL CARE IF:   There is severe back pain or lower  abdominal pain.   You or your child develops chills.   You have a fever.   Your baby is older than 3 months with a rectal temperature of 102 F (38.9 C) or higher.   Your baby is 3 months old or younger with a rectal temperature of 100.4 F (38 C) or higher.   There is nausea or vomiting.   There is continued burning or discomfort with urination.  MAKE SURE YOU:   Understand these instructions.   Will watch your condition.   Will get help right away if you are not doing well or get worse.  Document Released: 10/26/2004 Document Revised: 01/05/2011 Document Reviewed: 05/31/2006 [employer] Patient Information 2012 [employer].Urinary Tract Infection Infections of the urinary tract can start in several places. A bladder infection (cystitis), a kidney infection (pyelonephritis), and a prostate infection (prostatitis) are different types of urinary tract infections (UTIs). They usually get better if treated with medicines (antibiotics) that kill germs. Take all the medicine until it is gone. You or your child may feel better in a few days, but TAKE ALL MEDICINE or the infection may not respond and may become more difficult to treat. HOME CARE INSTRUCTIONS   Drink enough water and fluids to keep the urine clear or pale yellow. Cranberry juice is especially recommended, in addition to large amounts of water.   Avoid caffeine, tea, and carbonated beverages. They tend to irritate the bladder.   Alcohol may irritate the prostate.   Only take   over-the-counter or prescription medicines for pain, discomfort, or fever as directed by your caregiver.  To prevent further infections:  Empty the bladder often. Avoid holding urine for long periods of time.   After a bowel movement, women should cleanse from front to back. Use each tissue only once.   Empty the bladder before and after sexual intercourse.  FINDING OUT THE RESULTS OF YOUR TEST Not all test results are available during your  visit. If your or your child's test results are not back during the visit, make an appointment with your caregiver to find out the results. Do not assume everything is normal if you have not heard from your caregiver or the medical facility. It is important for you to follow up on all test results. SEEK MEDICAL CARE IF:   There is back pain.   Your baby is older than 3 months with a rectal temperature of 100.5 F (38.1 C) or higher for more than 1 day.   Your or your child's problems (symptoms) are no better in 3 days. Return sooner if you or your child is getting worse.  SEEK IMMEDIATE MEDICAL CARE IF:   There is severe back pain or lower abdominal pain.   You or your child develops chills.   You have a fever.   Your baby is older than 3 months with a rectal temperature of 102 F (38.9 C) or higher.   Your baby is 3 months old or younger with a rectal temperature of 100.4 F (38 C) or higher.   There is nausea or vomiting.   There is continued burning or discomfort with urination.  MAKE SURE YOU:   Understand these instructions.   Will watch your condition.   Will get help right away if you are not doing well or get worse.  Document Released: 10/26/2004 Document Revised: 01/05/2011 Document Reviewed: 05/31/2006 [employer] Patient Information 2012 [employer]. 

## 2011-06-20 NOTE — ED Notes (Signed)
MRI contacted, states pt will have MRI close to 1800.

## 2011-06-20 NOTE — ED Notes (Signed)
Pt had ectopic pregnancy one year ago and now is having swelling to abdomen and legs feel numb.  PT states that she has low appetite.  Pt vomits.  Pt states water stays down.  LMP- ended on Monday.    Pt has pain with urination.  Pt states symptoms have been for one week.  Pt reports lower back pain.

## 2011-06-20 NOTE — ED Notes (Signed)
Called [facility]ri and spoke with [name]. He advises will be approx 45 min before they get pt for study

## 2011-06-20 NOTE — ED Provider Notes (Signed)
History     CSN: [id number]  Arrival date & time 06/20/11  1254   First MD Initiated Contact with Patient 06/20/11 1516      Chief Complaint  Patient presents with  . Abdominal Pain     HPI Pt had ectopic pregnancy one year ago and now is having swelling to abdomen and legs feel numb. PT states that she has low appetite. Pt vomits. Pt states water stays down. LMP- ended on Monday. Pt has pain with urination. Pt states symptoms have been for one week. Pt reports lower back pain.  Patient had associated bilateral numbness to both lower extremities yesterday lasted approximately 5 minutes.        Past Medical History  Diagnosis Date  . Anemia   . Migraine     Past Surgical History  Procedure Date  . Cesarean section   . Wisdom tooth extraction     Family History  Problem Relation Age of Onset  . Diabetes Mother   . Hypertension Mother   . Diabetes Maternal Grandmother   . Hypertension Maternal Grandmother   . Cancer Maternal Grandmother   . Diabetes Maternal Grandfather   . Hypertension Maternal Grandfather     History  Substance Use Topics  . Smoking status: Never Smoker   . Smokeless tobacco: Not on file  . Alcohol Use: No    OB History    Grav Para Term Preterm Abortions TAB SAB Ect Mult Living   6 3 3  3 1 1 1  3       Review of Systems All remaining review of systems are negative Allergies  Review of patient's allergies indicates no known allergies.  Home Medications   Current Outpatient Rx  Name Route Sig Dispense Refill  . ACETAMINOPHEN 500 MG PO TABS Oral Take 500 mg by mouth every 6 (six) hours as needed. For headache    . SULFAMETHOXAZOLE-TRIMETHOPRIM 800-160 MG PO TABS Oral Take 1 tablet by mouth 2 (two) times daily. 13 tablet 0    BP 119/65  Pulse 82  Temp(Src) 97.6 F (36.4 C) (Oral)  Resp 18  SpO2 97%  LMP 06/12/2011  Physical Exam  Nursing note and vitals reviewed. Constitutional: She is oriented to person, place, and time. She  appears well-developed and well-nourished. No distress.  HENT:  Head: Normocephalic and atraumatic.  Eyes: Pupils are equal, round, and reactive to light.  Neck: Normal range of motion.  Cardiovascular: Normal rate and intact distal pulses.   Pulmonary/Chest: No respiratory distress.  Abdominal: Normal appearance. She exhibits no distension. There is no tenderness. There is no rigidity, no rebound, no guarding, no tenderness at McBurney's point and negative Murphy's sign. No hernia.  Musculoskeletal: Normal range of motion.       Lumbar back: She exhibits tenderness.       Back:  Neurological: She is alert and oriented to person, place, and time. No cranial nerve deficit.  Skin: Skin is warm and dry. No rash noted.  Psychiatric: She has a normal mood and affect. Her behavior is normal.    ED Course  Procedures (including critical care time)  Labs Reviewed  URINALYSIS, ROUTINE W REFLEX MICROSCOPIC - Abnormal; Notable for the following:    APPearance HAZY (*)    Hgb urine dipstick SMALL (*)    Protein, ur 30 (*)    Leukocytes, UA LARGE (*)    All other components within normal limits  URINE MICROSCOPIC-ADD ON - Abnormal; Notable for the following:  Squamous Epithelial / LPF FEW (*)    Bacteria, UA FEW (*)    All other components within normal limits  URINE CULTURE   No results found.  . Results for orders placed during the hospital encounter of 06/20/11  URINALYSIS, ROUTINE W REFLEX MICROSCOPIC      Component Value Range   Color, Urine YELLOW  YELLOW    APPearance HAZY (*) CLEAR    Specific Gravity, Urine 1.017  1.005 - 1.030    pH 6.0  5.0 - 8.0    Glucose, UA NEGATIVE  NEGATIVE (mg/dL)   Hgb urine dipstick SMALL (*) NEGATIVE    Bilirubin Urine NEGATIVE  NEGATIVE    Ketones, ur NEGATIVE  NEGATIVE (mg/dL)   Protein, ur 30 (*) NEGATIVE (mg/dL)   Urobilinogen, UA 0.2  0.0 - 1.0 (mg/dL)   Nitrite NEGATIVE  NEGATIVE    Leukocytes, UA LARGE (*) NEGATIVE   URINE  MICROSCOPIC-ADD ON      Component Value Range   Squamous Epithelial / LPF FEW (*) RARE    WBC, UA 11-20  <3 (WBC/hpf)   RBC / HPF 3-6  <3 (RBC/hpf)   Bacteria, UA FEW (*) RARE    Urine-Other MUCOUS PRESENT    URINE CULTURE      Component Value Range   Specimen Description URINE, CLEAN CATCH     Special Requests ADDED [phone]     Culture  Setup Time [id number]     Colony Count >=100,000 COLONIES/ML     Culture       Value: STAPHYLOCOCCUS SPECIES (COAGULASE NEGATIVE)     Note: RIFAMPIN AND GENTAMICIN SHOULD NOT BE USED AS SINGLE DRUGS FOR TREATMENT OF STAPH INFECTIONS.   Report Status PENDING     Mr Lumbar Spine Wo Contrast  06/20/2011  *RADIOLOGY REPORT*  Clinical Data: Low back pain for past 4 days extends down both legs with bilateral leg numbness today.  No injury.  MRI LUMBAR SPINE WITHOUT CONTRAST  Technique:  Multiplanar and multiecho pulse sequences of the lumbar spine were obtained without intravenous contrast.  Comparison: None.  Findings: Last fully open disc space is labeled L5-S1.  Present examination incorporates from T10-11 disc space through the S3 level.  Conus upper L1 level.  Fullness of the left renal collecting system.  T10-11 through L1-2 unremarkable.  L2-3:  Minimal bulge greater to the left.  L3-4:  Negative.  L4-5:  Negative.  L5-S1:  Negative.  Right sacroiliac joint degenerative changes.  IMPRESSION: Left renal collecting system fullness.  Cause indeterminate.  L2-3 minimal bulge greater to the left.  Original Report Authenticated By: Fuller Canada, M.D.   US Transvaginal Non-ob  06/18/2011  *RADIOLOGY REPORT*  Clinical Data: Were were left lower quadrant pain  TRANSABDOMINAL AND TRANSVAGINAL ULTRASOUND OF PELVIS Technique:  Both transabdominal and transvaginal ultrasound examinations of the pelvis were performed. Transabdominal technique was performed for global imaging of the pelvis including uterus, ovaries, adnexal regions, and pelvic cul-de-sac.   Comparison: 08/04/2010   It was necessary to proceed with endovaginal exam following the transabdominal exam to visualize the endometrium and adnexa.  Findings:  Uterus: Heterogeneous in echogenicity, measures up to 9.0 x 4.7 x 5.3 cm.  Endometrium: Normal in thickness and appearance, measuring up to 6 mm.  Right ovary:  Normal sonographic appearance, measuring 3.0 x 2.2 x 2.3 cm.  Left ovary: Normal sonographic appearance, measuring 2.5 x 1.6 x 2.3 cm.  Other findings: No free fluid  IMPRESSION: Mildly heterogeneous appearance  to the myometrium, may reflect a small fibroids.  Otherwise, normal pelvic ultrasound.  Original Report Authenticated By: Waneta Martins, M.D.   US Pelvis Complete  06/18/2011  *RADIOLOGY REPORT*  Clinical Data: Were were left lower quadrant pain  TRANSABDOMINAL AND TRANSVAGINAL ULTRASOUND OF PELVIS Technique:  Both transabdominal and transvaginal ultrasound examinations of the pelvis were performed. Transabdominal technique was performed for global imaging of the pelvis including uterus, ovaries, adnexal regions, and pelvic cul-de-sac.  Comparison: 08/04/2010   It was necessary to proceed with endovaginal exam following the transabdominal exam to visualize the endometrium and adnexa.  Findings:  Uterus: Heterogeneous in echogenicity, measures up to 9.0 x 4.7 x 5.3 cm.  Endometrium: Normal in thickness and appearance, measuring up to 6 mm.  Right ovary:  Normal sonographic appearance, measuring 3.0 x 2.2 x 2.3 cm.  Left ovary: Normal sonographic appearance, measuring 2.5 x 1.6 x 2.3 cm.  Other findings: No free fluid  IMPRESSION: Mildly heterogeneous appearance to the myometrium, may reflect a small fibroids.  Otherwise, normal pelvic ultrasound.  Original Report Authenticated By: Waneta Martins, M.D.     1. UTI (lower urinary tract infection)       MDM         Nelia Shi, MD 06/22/11 [phone]

## 2011-06-23 LAB — URINE CULTURE

## 2011-06-24 NOTE — ED Notes (Signed)
+  Urine. Patient given Septra DS. No sensitivity listed. Chart sent to EDP office for review. °

## 2011-06-25 NOTE — ED Notes (Signed)
Chart returned from EDP office. Prescribed Macrobid 100 mg po BID for 7 days, #14. Prescribed by Micheals PA-C 

## 2011-06-25 NOTE — ED Notes (Signed)
Called patient and informed them of new RX. Wants RX called to [facility] on [address] [employer] [phone]). RX called in by Norm Parcel PFM.

## 2012-02-01 ENCOUNTER — Emergency Department (HOSPITAL_COMMUNITY)
Admission: EM | Admit: 2012-02-01 | Discharge: 2012-02-01 | Disposition: A | Discharge status: Home / Self Care | Payer: Self-pay | Attending: Emergency Medicine | Admitting: Emergency Medicine

## 2012-02-01 ENCOUNTER — Encounter (HOSPITAL_COMMUNITY): Payer: Self-pay | Admitting: Cardiology

## 2012-02-01 DIAGNOSIS — R197 Diarrhea, unspecified: Secondary | ICD-10-CM | POA: Insufficient documentation

## 2012-02-01 DIAGNOSIS — E669 Obesity, unspecified: Secondary | ICD-10-CM | POA: Insufficient documentation

## 2012-02-01 DIAGNOSIS — R209 Unspecified disturbances of skin sensation: Secondary | ICD-10-CM | POA: Insufficient documentation

## 2012-02-01 DIAGNOSIS — G43909 Migraine, unspecified, not intractable, without status migrainosus: Secondary | ICD-10-CM | POA: Insufficient documentation

## 2012-02-01 DIAGNOSIS — Z862 Personal history of diseases of the blood and blood-forming organs and certain disorders involving the immune mechanism: Secondary | ICD-10-CM | POA: Insufficient documentation

## 2012-02-01 DIAGNOSIS — R110 Nausea: Secondary | ICD-10-CM | POA: Insufficient documentation

## 2012-02-01 DIAGNOSIS — H53149 Visual discomfort, unspecified: Secondary | ICD-10-CM | POA: Insufficient documentation

## 2012-02-01 MED ORDER — SODIUM CHLORIDE 0.9 % IV BOLUS (SEPSIS)
1000.0000 mL | Freq: Once | INTRAVENOUS | Status: AC
  Administered 2012-02-01: 1000 mL via INTRAVENOUS

## 2012-02-01 MED ORDER — KETOROLAC TROMETHAMINE 30 MG/ML IJ SOLN
30.0000 mg | Freq: Once | INTRAMUSCULAR | Status: AC
  Administered 2012-02-01: 30 mg via INTRAVENOUS
  Filled 2012-02-01: qty 1

## 2012-02-01 MED ORDER — METOCLOPRAMIDE HCL 5 MG/ML IJ SOLN
10.0000 mg | Freq: Once | INTRAMUSCULAR | Status: AC
  Administered 2012-02-01: 10 mg via INTRAVENOUS
  Filled 2012-02-01: qty 2

## 2012-02-01 MED ORDER — ONDANSETRON HCL 4 MG PO TABS: 4.0000 mg | ORAL_TABLET | Freq: Four times a day (QID) | ORAL | Status: DC

## 2012-02-01 MED ORDER — DIPHENHYDRAMINE HCL 50 MG/ML IJ SOLN
25.0000 mg | Freq: Once | INTRAMUSCULAR | Status: AC
  Administered 2012-02-01: 25 mg via INTRAVENOUS
  Filled 2012-02-01: qty 1

## 2012-02-01 NOTE — ED Notes (Signed)
Pt states she has had headache since Jan 31st; pt states sensitivity to light; nausea and diarrhea.

## 2012-02-01 NOTE — Discharge Instructions (Signed)
Ms Kaleta we gave you a migraine cocktail in the ER today with Benadryl, Reglan, and Toradol in your IV. We also hydrated to this IV fluids. The prescription for Zofran is for nausea. Call tomorrow make appointment at the headache clinic. Return to the ER for severe pain or any other concerns.  Migraine Headache A migraine headache is very bad, throbbing pain on one or both sides of your head. Talk to your doctor about what things may bring on (trigger) your migraine headaches. HOME CARE  Only take medicines as told by your doctor.  Lie down in a dark, quiet room when you have a migraine.  Keep a journal to find out if certain things bring on migraine headaches. For example, write down:  What you eat and drink.  How much sleep you get.  Any change to your diet or medicines.  Lessen how much alcohol you drink.  Quit smoking if you smoke.  Get enough sleep.  Lessen any stress in your life.  Keep lights dim if bright lights bother you or make your migraines worse. GET HELP RIGHT AWAY IF:   Your migraine becomes really bad.  You have a fever.  You have a stiff neck.  You have trouble seeing.  Your muscles are weak, or you lose muscle control.  You lose your balance or have trouble walking.  You feel like you will pass out (faint), or you pass out.  You have really bad symptoms that are different than your first symptoms. MAKE SURE YOU:   Understand these instructions.  Will watch your condition.  Will get help right away if you are not doing well or get worse. Document Released: 10/26/2007 Document Revised: 04/10/2011 Document Reviewed: 01/06/2011 [facility] Patient Information 2013 [place], [place].

## 2012-02-01 NOTE — ED Notes (Signed)
Anne Crawford, NP at bedside.  

## 2012-02-01 NOTE — ED Notes (Signed)
Pt reports n/d that started a couple of days ago. States she woke this morning with a headache, reports some blurred vision and light sensitive. No neuro deficits.

## 2012-02-01 NOTE — ED Notes (Signed)
Pt ambulatory leaving ED with family. Pt instructed not drive. Pt states she is not driving home, her mother is here to take her home. Pt has no further questions upon d/c teaching. Pt given d/c paperwork and prescriptions. PT does not appear to be in any acute distress upon d/c.

## 2012-02-01 NOTE — ED Provider Notes (Signed)
History     CSN: [id number]  Arrival date & time 02/01/12  1342   First MD Initiated Contact with Patient 02/01/12 1552      Chief Complaint  Patient presents with  . Headache  . Nausea  . Diarrhea    (Consider location/radiation/quality/duration/timing/severity/associated sxs/prior treatment) Patient is a 32 y.o. female presenting with migraines. The history is provided by the patient. No language interpreter was used.  Migraine This is a recurrent problem. The current episode started in the past 7 days. The problem occurs intermittently. The problem has been gradually worsening. Associated symptoms include headaches, nausea and numbness. Pertinent negatives include no abdominal pain, chills, fever, vomiting or weakness.   32 year old obese female coming in with her usual unilateral migraine headache with nausea today. Past medical history anemia and migraines The headache started 3 days ago and has been intermittent unilateral to the right side. She is having intermittent numbness to her right face with photophobia and phonophobia as well. States that today the pain has been constant 8/10 despite taking Tylenol PM last night. Patient has gone to the headache clinic in the past. She has also undergone acupuncture with no results. Denies neck pain, fever,  Vomiting, or upper respiratory symptoms. Patient has been on Topamax in the past but she says it did not help.  Past Medical History  Diagnosis Date  . Anemia   . Migraine     Past Surgical History  Procedure Date  . Cesarean section   . Wisdom tooth extraction     Family History  Problem Relation Age of Onset  . Diabetes Mother   . Hypertension Mother   . Diabetes Maternal Grandmother   . Hypertension Maternal Grandmother   . Cancer Maternal Grandmother   . Diabetes Maternal Grandfather   . Hypertension Maternal Grandfather     History  Substance Use Topics  . Smoking status: Never Smoker   . Smokeless tobacco: Not  on file  . Alcohol Use: No    OB History    Grav Para Term Preterm Abortions TAB SAB Ect Mult Living   6 3 3  3 1 1 1  3       Review of Systems  Constitutional: Negative.  Negative for fever and chills.  Eyes: Negative.   Respiratory: Negative.   Cardiovascular: Negative.   Gastrointestinal: Positive for nausea. Negative for vomiting and abdominal pain.  Neurological: Positive for numbness and headaches. Negative for dizziness, syncope, facial asymmetry, speech difficulty and weakness.  Psychiatric/Behavioral: Negative.   All other systems reviewed and are negative.    Allergies  Review of patient's allergies indicates no known allergies.  Home Medications   Current Outpatient Rx  Name  Route  Sig  Dispense  Refill  . ACETAMINOPHEN 500 MG PO TABS   Oral   Take 500 mg by mouth every 6 (six) hours as needed. For headache           BP 126/81  Pulse 92  Temp 98.7 F (37.1 C) (Oral)  Resp 18  SpO2 99%  Physical Exam  Nursing note and vitals reviewed. Constitutional: She is oriented to person, place, and time. She appears well-developed and well-nourished.  HENT:  Head: Normocephalic and atraumatic.  Eyes: Conjunctivae normal and EOM are normal. Pupils are equal, round, and reactive to light.  Neck: Normal range of motion. Neck supple.  Cardiovascular: Normal rate.   Pulmonary/Chest: Effort normal.  Abdominal: Soft.  Musculoskeletal: Normal range of motion. She exhibits no edema  and no tenderness.  Neurological: She is alert and oriented to person, place, and time. She has normal strength and normal reflexes. She displays normal reflexes. No cranial nerve deficit or sensory deficit. Coordination normal. GCS eye subscore is 4. GCS verbal subscore is 5. GCS motor subscore is 6.       Neuro intact. Pupils equal and reactive. Normal coordination and sensation presently. MAE =  Skin: Skin is warm and dry.  Psychiatric: She has a normal mood and affect.    ED Course    Procedures (including critical care time)  Labs Reviewed - No data to display No results found.   No diagnosis found.    MDM  32 year old with her usual migraine headache x3 days intermittently. Migraine cocktail in the ER with results. She will followup at the headache clinic. She will call in the morning to make an appointment.  Neuro intact with good sensation/coordination and MAE =. Rx for Zofran on discharge.        Remi Haggard, NP 02/01/12 1709  Remi Haggard, NP 02/06/12 1010

## 2012-02-07 NOTE — ED Provider Notes (Signed)
Medical screening examination/treatment/procedure(s) were performed by non-physician practitioner and as supervising physician I was immediately available for consultation/collaboration.   Brittni Hult, MD 02/07/12 0724 

## 2012-04-25 ENCOUNTER — Emergency Department (HOSPITAL_COMMUNITY): Payer: Medicaid Other

## 2012-04-25 ENCOUNTER — Encounter (HOSPITAL_COMMUNITY): Payer: Self-pay | Admitting: General Practice

## 2012-04-25 ENCOUNTER — Emergency Department (HOSPITAL_COMMUNITY)
Admission: EM | Admit: 2012-04-25 | Discharge: 2012-04-25 | Disposition: A | Discharge status: Home / Self Care | Payer: Medicaid Other | Attending: Emergency Medicine | Admitting: Emergency Medicine

## 2012-04-25 DIAGNOSIS — N390 Urinary tract infection, site not specified: Secondary | ICD-10-CM | POA: Insufficient documentation

## 2012-04-25 DIAGNOSIS — R112 Nausea with vomiting, unspecified: Secondary | ICD-10-CM | POA: Insufficient documentation

## 2012-04-25 DIAGNOSIS — K5900 Constipation, unspecified: Secondary | ICD-10-CM | POA: Insufficient documentation

## 2012-04-25 DIAGNOSIS — N200 Calculus of kidney: Secondary | ICD-10-CM

## 2012-04-25 DIAGNOSIS — R197 Diarrhea, unspecified: Secondary | ICD-10-CM | POA: Insufficient documentation

## 2012-04-25 DIAGNOSIS — Z79899 Other long term (current) drug therapy: Secondary | ICD-10-CM | POA: Insufficient documentation

## 2012-04-25 DIAGNOSIS — Z862 Personal history of diseases of the blood and blood-forming organs and certain disorders involving the immune mechanism: Secondary | ICD-10-CM | POA: Insufficient documentation

## 2012-04-25 DIAGNOSIS — R209 Unspecified disturbances of skin sensation: Secondary | ICD-10-CM | POA: Insufficient documentation

## 2012-04-25 DIAGNOSIS — Z8679 Personal history of other diseases of the circulatory system: Secondary | ICD-10-CM | POA: Insufficient documentation

## 2012-04-25 LAB — CBC WITH DIFFERENTIAL/PLATELET
Basophils Absolute: 0.1 10*3/uL (ref 0.0–0.1)
HCT: 39.6 % (ref 36.0–46.0)
Lymphs Abs: 2.8 10*3/uL (ref 0.7–4.0)
MCH: 22.2 pg — ABNORMAL LOW (ref 26.0–34.0)
MCHC: 31.6 g/dL (ref 30.0–36.0)
MCV: 70.5 fL — ABNORMAL LOW (ref 78.0–100.0)
Monocytes Absolute: 0.5 10*3/uL (ref 0.1–1.0)
Monocytes Relative: 6 % (ref 3–12)
Neutro Abs: 5.1 10*3/uL (ref 1.7–7.7)
Platelets: 372 10*3/uL (ref 150–400)
RDW: 13.3 % (ref 11.5–15.5)
WBC: 8.9 10*3/uL (ref 4.0–10.5)

## 2012-04-25 LAB — POCT I-STAT, CHEM 8
BUN: 4 mg/dL — ABNORMAL LOW (ref 6–23)
Calcium, Ion: 1.2 mmol/L (ref 1.12–1.23)
Creatinine, Ser: 0.8 mg/dL (ref 0.50–1.10)
Glucose, Bld: 97 mg/dL (ref 70–99)
Hemoglobin: 15.6 g/dL — ABNORMAL HIGH (ref 12.0–15.0)
Sodium: 139 mEq/L (ref 135–145)
TCO2: 30 mmol/L (ref 0–100)

## 2012-04-25 LAB — URINALYSIS, ROUTINE W REFLEX MICROSCOPIC
Bilirubin Urine: NEGATIVE
Ketones, ur: 15 mg/dL — AB
Protein, ur: NEGATIVE mg/dL
Specific Gravity, Urine: 1.026 (ref 1.005–1.030)
Urobilinogen, UA: 0.2 mg/dL (ref 0.0–1.0)
pH: 5.5 (ref 5.0–8.0)

## 2012-04-25 LAB — URINE MICROSCOPIC-ADD ON

## 2012-04-25 MED ORDER — CIPROFLOXACIN IN D5W 400 MG/200ML IV SOLN: 400.0000 mg | Freq: Once | INTRAVENOUS | Status: DC

## 2012-04-25 MED ORDER — ONDANSETRON 4 MG PO TBDP
4.0000 mg | ORAL_TABLET | Freq: Once | ORAL | Status: AC
  Administered 2012-04-25: 4 mg via ORAL
  Filled 2012-04-25: qty 1

## 2012-04-25 MED ORDER — ONDANSETRON HCL 4 MG/2ML IJ SOLN
INTRAMUSCULAR | Status: AC
  Filled 2012-04-25: qty 2

## 2012-04-25 MED ORDER — IOHEXOL 300 MG/ML  SOLN
25.0000 mL | INTRAMUSCULAR | Status: AC
  Administered 2012-04-25: 25 mL via ORAL

## 2012-04-25 MED ORDER — IOHEXOL 300 MG/ML  SOLN
100.0000 mL | Freq: Once | INTRAMUSCULAR | Status: AC | PRN
  Administered 2012-04-25: 100 mL via INTRAVENOUS

## 2012-04-25 MED ORDER — OXYCODONE-ACETAMINOPHEN 5-325 MG PO TABS
2.0000 | ORAL_TABLET | Freq: Once | ORAL | Status: AC
  Administered 2012-04-25: 2 via ORAL
  Filled 2012-04-25: qty 2

## 2012-04-25 MED ORDER — CIPROFLOXACIN HCL 500 MG PO TABS: 500.0000 mg | ORAL_TABLET | Freq: Once | ORAL | Status: DC

## 2012-04-25 MED ORDER — ONDANSETRON HCL 4 MG/2ML IJ SOLN
4.0000 mg | Freq: Once | INTRAMUSCULAR | Status: AC
  Administered 2012-04-25: 4 mg via INTRAVENOUS

## 2012-04-25 MED ORDER — OXYCODONE-ACETAMINOPHEN 5-325 MG PO TABS: 1.0000 | ORAL_TABLET | ORAL | Status: DC | PRN

## 2012-04-25 MED ORDER — ONDANSETRON 4 MG PO TBDP: 4.0000 mg | ORAL_TABLET | Freq: Once | ORAL | Status: DC

## 2012-04-25 MED ORDER — CIPROFLOXACIN HCL 500 MG PO TABS: 500.0000 mg | ORAL_TABLET | Freq: Two times a day (BID) | ORAL | Status: DC

## 2012-04-25 MED ORDER — CIPROFLOXACIN HCL 500 MG PO TABS
500.0000 mg | ORAL_TABLET | Freq: Once | ORAL | Status: AC
  Administered 2012-04-25: 500 mg via ORAL
  Filled 2012-04-25: qty 1

## 2012-04-25 NOTE — Discharge Instructions (Signed)
Be sure to call Dr. Margrett Rud office in the morning [phone]) Complete the entire course of antibiotics. Take your prescription pain medicine as needed for pain.   Kidney Stones Kidney stones (ureteral lithiasis) are deposits that form inside your kidneys. The intense pain is caused by the stone moving through the urinary tract. When the stone moves, the ureter goes into spasm around the stone. The stone is usually passed in the urine.  CAUSES   A disorder that makes certain neck glands produce too much parathyroid hormone (primary hyperparathyroidism).  A buildup of uric acid crystals.  Narrowing (stricture) of the ureter.  A kidney obstruction present at birth (congenital obstruction).  Previous surgery on the kidney or ureters.  Numerous kidney infections. SYMPTOMS   Feeling sick to your stomach (nauseous).  Throwing up (vomiting).  Blood in the urine (hematuria).  Pain that usually spreads (radiates) to the groin.  Frequency or urgency of urination. DIAGNOSIS   Taking a history and physical exam.  Blood or urine tests.  Computerized X-ray scan (CT scan).  Occasionally, an examination of the inside of the urinary bladder (cystoscopy) is performed. TREATMENT   Observation.  Increasing your fluid intake.  Surgery may be needed if you have severe pain or persistent obstruction. The size, location, and chemical composition are all important variables that will determine the proper choice of action for you. Talk to your caregiver to better understand your situation so that you will minimize the risk of injury to yourself and your kidney.  HOME CARE INSTRUCTIONS   Drink enough water and fluids to keep your urine clear or pale yellow.  Strain all urine through the provided strainer. Keep all particulate matter and stones for your caregiver to see. The stone causing the pain may be as small as a grain of salt. It is very important to use the strainer each and every  time you pass your urine. The collection of your stone will allow your caregiver to analyze it and verify that a stone has actually passed.  Only take over-the-counter or prescription medicines for pain, discomfort, or fever as directed by your caregiver.  Make a follow-up appointment with your caregiver as directed.  Get follow-up X-rays if required. The absence of pain does not always mean that the stone has passed. It may have only stopped moving. If the urine remains completely obstructed, it can cause loss of kidney function or even complete destruction of the kidney. It is your responsibility to make sure X-rays and follow-ups are completed. Ultrasounds of the kidney can show blockages and the status of the kidney. Ultrasounds are not associated with any radiation and can be performed easily in a matter of minutes. SEEK IMMEDIATE MEDICAL CARE IF:   Pain cannot be controlled with the prescribed medicine.  You have a fever.  The severity or intensity of pain increases over 18 hours and is not relieved by pain medicine.  You develop a new onset of abdominal pain.  You feel faint or pass out. MAKE SURE YOU:   Understand these instructions.  Will watch your condition.  Will get help right away if you are not doing well or get worse. Document Released: 01/16/2005 Document Revised: 04/10/2011 Document Reviewed: 05/14/2009 [facility] Patient Information 2013 [place], [place].

## 2012-04-25 NOTE — ED Provider Notes (Signed)
History     CSN: [id number]  Arrival date & time 04/25/12  4696   First MD Initiated Contact with Patient 04/25/12 0848      Chief Complaint  Patient presents with  . Abdominal Pain    (Consider location/radiation/quality/duration/timing/severity/associated sxs/prior treatment) HPI Comments: 32 y.o. Female complaining right sided abdominal pain since Tues, gradually increasing in intensity. Pt thought she was constipated, taking Ducolax and Mirolax, pepto bismol. Pt has been passing stool regularly. Constant. Sharp. "feels like someone is twisting my intestines."  Pt had emergent ectopic pregnancy in left ovary 2 years ago and as a result had a tubal ligation. Pt states "this pain feels exactly like that." She is concerned even though she had the tubal.   Nausea. Vomiting x2. Diarrhea.   Denies urinary sx (hematuria, dysuria, frequency, urgency). No hx of kidney stones.   PMHx of headaches/migraines, anemia  Patient is a 32 y.o. female presenting with abdominal pain.  Abdominal Pain Associated symptoms: constipation, diarrhea, nausea and vomiting   Associated symptoms: no chest pain, no dysuria, no fever, no hematuria, no shortness of breath and no vaginal discharge     Past Medical History  Diagnosis Date  . Anemia   . Migraine     Past Surgical History  Procedure Laterality Date  . Cesarean section    . Wisdom tooth extraction    . Tubal ligation Left June 2012    Family History  Problem Relation Age of Onset  . Diabetes Mother   . Hypertension Mother   . Diabetes Maternal Grandmother   . Hypertension Maternal Grandmother   . Cancer Maternal Grandmother   . Diabetes Maternal Grandfather   . Hypertension Maternal Grandfather     History  Substance Use Topics  . Smoking status: Never Smoker   . Smokeless tobacco: Not on file  . Alcohol Use: No    OB History   Grav Para Term Preterm Abortions TAB SAB Ect Mult Living   6 3 3  3 1 1 1  3       Review of  Systems  Constitutional: Negative for fever and diaphoresis.  HENT: Negative for neck pain and neck stiffness.   Eyes: Negative for visual disturbance.  Respiratory: Negative for chest tightness and shortness of breath.   Cardiovascular: Negative for chest pain and palpitations.  Gastrointestinal: Positive for nausea, vomiting, abdominal pain, diarrhea and constipation.       Right sided  Genitourinary: Negative for dysuria, hematuria, flank pain, vaginal discharge and pelvic pain.  Musculoskeletal: Negative for gait problem.  Skin: Negative for rash.  Neurological: Positive for numbness and headaches. Negative for dizziness, weakness and light-headedness.       Hx of migraines, tingling down right side when the pain strikes.     Allergies  Review of patient's allergies indicates no known allergies.  Home Medications   Current Outpatient Rx  Name  Route  Sig  Dispense  Refill  . bisacodyl (DULCOLAX) 5 MG EC tablet   Oral   Take 10 mg by mouth daily as needed for constipation.         . bismuth subsalicylate (PEPTO BISMOL) 262 MG chewable tablet   Oral   Chew 524 mg by mouth as needed for indigestion or diarrhea or loose stools.         . Diphenhydramine-APAP, sleep, (TYLENOL PM EXTRA STRENGTH PO)   Oral   Take 2 tablets by mouth every 6 (six) hours as needed. For headaches and pain         .  polyethylene glycol (MIRALAX / GLYCOLAX) packet   Oral   Take 17 g by mouth daily.           BP 133/82  Pulse 90  Temp(Src) 98 F (36.7 C) (Oral)  Resp 16  SpO2 97%  LMP 04/24/2012  Physical Exam  Nursing note and vitals reviewed. Constitutional: She is oriented to person, place, and time. She appears well-developed and well-nourished. No distress.  HENT:  Head: Normocephalic and atraumatic.  Eyes: Conjunctivae and EOM are normal.  Neck: Normal range of motion. Neck supple.  No meningeal signs  Cardiovascular: Normal rate, regular rhythm, normal heart sounds and  intact distal pulses.   Pulmonary/Chest: Effort normal and breath sounds normal. No respiratory distress. She has no wheezes. She has no rales. She exhibits no tenderness.  Abdominal: Soft. Bowel sounds are normal. She exhibits no distension. There is tenderness. There is no rebound and no guarding.    Right sided  Genitourinary:  No CVA tenderness  Musculoskeletal: Normal range of motion. She exhibits no edema and no tenderness.  5/5 strength throughout.   Neurological: She is alert and oriented to person, place, and time. No cranial nerve deficit.  No focal deficits, sensation to light touch intact.   Skin: Skin is warm and dry. She is not diaphoretic. No erythema.  Psychiatric: She has a normal mood and affect.    ED Course  Procedures (including critical care time)  Labs Reviewed  URINALYSIS, ROUTINE W REFLEX MICROSCOPIC - Abnormal; Notable for the following:    APPearance CLOUDY (*)    Hgb urine dipstick TRACE (*)    Ketones, ur 15 (*)    Leukocytes, UA LARGE (*)    All other components within normal limits  CBC WITH DIFFERENTIAL - Abnormal; Notable for the following:    RBC 5.62 (*)    MCV 70.5 (*)    MCH 22.2 (*)    All other components within normal limits  URINE MICROSCOPIC-ADD ON - Abnormal; Notable for the following:    Squamous Epithelial / LPF MANY (*)    Bacteria, UA MANY (*)    All other components within normal limits  POCT I-STAT, CHEM 8 - Abnormal; Notable for the following:    BUN 4 (*)    Hemoglobin 15.6 (*)    All other components within normal limits  URINE CULTURE  POCT PREGNANCY, URINE   US Transvaginal Non-ob  04/25/2012  *RADIOLOGY REPORT*  Clinical Data:  Right-sided pain.  Evaluate for torsion.  LMP 04/16/2012  TRANSABDOMINAL AND TRANSVAGINAL ULTRASOUND OF PELVIS DOPPLER ULTRASOUND OF OVARIES  Technique:  Both transabdominal and transvaginal ultrasound examinations of the pelvis were performed. Transabdominal technique was performed for global  imaging of the pelvis including uterus, ovaries, adnexal regions, and pelvic cul-de-sac.  It was necessary to proceed with endovaginal exam following the transabdominal exam to visualize the myometrium, endometrium and adnexa.  Color and duplex Doppler ultrasound was utilized to evaluate blood flow to the ovaries.  Comparison:  06/18/2011  Findings:  Uterus:  Is anteverted and anteflexed and demonstrates a sagittal length of 8.8 cm, depth of 4.9 cm and width of 5.1 cm.  A mildly heterogeneous myometrium is seen with no focal abnormality identified.  Endometrium:  Appears trilayered with a width of 8.6 mm.  No areas of focal thickening or heterogeneity are seen and this would correlate with a periovulatory endometrial stripe and correspond with the provided LMP of 04/16/2012.  Right ovary: Measures 3.2 x 2.5 x 2.9  cm and has a normal appearance.  Color Doppler evaluation reveals the presence of intra stromal flow  Left ovary:   Measures 2.9 x 2.1 x 2.9 cm and has a normal appearance.  Color Doppler reveals the presence of intra stromal flow  Pulsed Doppler evaluation demonstrates normal low-resistance arterial and venous waveforms in both ovaries.  Other:  No pelvic fluid or separate adnexal masses are seen.  IMPRESSION: Normal uterine myometrium, endometrium and ovaries  No sonographic evidence for ovarian torsion.   Original Report Authenticated By: Rhodia Albright, M.D.    US Pelvis Complete  04/25/2012  *RADIOLOGY REPORT*  Clinical Data:  Right-sided pain.  Evaluate for torsion.  LMP 04/16/2012  TRANSABDOMINAL AND TRANSVAGINAL ULTRASOUND OF PELVIS DOPPLER ULTRASOUND OF OVARIES  Technique:  Both transabdominal and transvaginal ultrasound examinations of the pelvis were performed. Transabdominal technique was performed for global imaging of the pelvis including uterus, ovaries, adnexal regions, and pelvic cul-de-sac.  It was necessary to proceed with endovaginal exam following the transabdominal exam to  visualize the myometrium, endometrium and adnexa.  Color and duplex Doppler ultrasound was utilized to evaluate blood flow to the ovaries.  Comparison:  06/18/2011  Findings:  Uterus:  Is anteverted and anteflexed and demonstrates a sagittal length of 8.8 cm, depth of 4.9 cm and width of 5.1 cm.  A mildly heterogeneous myometrium is seen with no focal abnormality identified.  Endometrium:  Appears trilayered with a width of 8.6 mm.  No areas of focal thickening or heterogeneity are seen and this would correlate with a periovulatory endometrial stripe and correspond with the provided LMP of 04/16/2012.  Right ovary: Measures 3.2 x 2.5 x 2.9 cm and has a normal appearance.  Color Doppler evaluation reveals the presence of intra stromal flow  Left ovary:   Measures 2.9 x 2.1 x 2.9 cm and has a normal appearance.  Color Doppler reveals the presence of intra stromal flow  Pulsed Doppler evaluation demonstrates normal low-resistance arterial and venous waveforms in both ovaries.  Other:  No pelvic fluid or separate adnexal masses are seen.  IMPRESSION: Normal uterine myometrium, endometrium and ovaries  No sonographic evidence for ovarian torsion.   Original Report Authenticated By: Rhodia Albright, M.D.    Ct Abdomen Pelvis W Contrast  04/25/2012  *RADIOLOGY REPORT*  Clinical Data: Right side abdominal pain.  Question appendicitis.  CT ABDOMEN AND PELVIS WITH CONTRAST  Technique:  Multidetector CT imaging of the abdomen and pelvis was performed following the standard protocol during bolus administration of intravenous contrast.  Contrast: OMNIPAQUE IOHEXOL 300 MG/ML  SOLN  Comparison: None.  Findings: Dependent atelectasis is seen in the lung bases.  No pleural or pericardial effusion.  The patient has marked right hydronephrosis with dilatation of the right ureter and perirenal and ureteral stranding due to a 0.6 centimeters stone just below the pelvic brim.  No other ureteral stones are seen.  The kidneys  otherwise appear normal.  No evidence of appendicitis is identified.  Uterus, adnexa and urinary bladder appear normal.  The stomach and small and large bowel are unremarkable.  There is no lymphadenopathy or fluid.  The liver, gallbladder, spleen, pancreas and adrenal glands appear normal.  There is no focal bony abnormality.  IMPRESSION: Severe right hydronephrosis due to a 0.6 cm right ureteral stone just below the pelvic brim.  Negative for appendicitis or other acute abnormality.   Original Report Authenticated By: Holley Dexter, M.D.    [place] Art/ven Flow Abd Pelv Doppler  04/25/2012  *  RADIOLOGY REPORT*  Clinical Data:  Right-sided pain.  Evaluate for torsion.  LMP 04/16/2012  TRANSABDOMINAL AND TRANSVAGINAL ULTRASOUND OF PELVIS DOPPLER ULTRASOUND OF OVARIES  Technique:  Both transabdominal and transvaginal ultrasound examinations of the pelvis were performed. Transabdominal technique was performed for global imaging of the pelvis including uterus, ovaries, adnexal regions, and pelvic cul-de-sac.  It was necessary to proceed with endovaginal exam following the transabdominal exam to visualize the myometrium, endometrium and adnexa.  Color and duplex Doppler ultrasound was utilized to evaluate blood flow to the ovaries.  Comparison:  06/18/2011  Findings:  Uterus:  Is anteverted and anteflexed and demonstrates a sagittal length of 8.8 cm, depth of 4.9 cm and width of 5.1 cm.  A mildly heterogeneous myometrium is seen with no focal abnormality identified.  Endometrium:  Appears trilayered with a width of 8.6 mm.  No areas of focal thickening or heterogeneity are seen and this would correlate with a periovulatory endometrial stripe and correspond with the provided LMP of 04/16/2012.  Right ovary: Measures 3.2 x 2.5 x 2.9 cm and has a normal appearance.  Color Doppler evaluation reveals the presence of intra stromal flow  Left ovary:   Measures 2.9 x 2.1 x 2.9 cm and has a normal appearance.  Color Doppler  reveals the presence of intra stromal flow  Pulsed Doppler evaluation demonstrates normal low-resistance arterial and venous waveforms in both ovaries.  Other:  No pelvic fluid or separate adnexal masses are seen.  IMPRESSION: Normal uterine myometrium, endometrium and ovaries  No sonographic evidence for ovarian torsion.   Original Report Authenticated By: Rhodia Albright, M.D.      1. Kidney stone on right side       MDM  Afebrile. Vitals stable. Right sided very tender, d/t hx consider ectopic even though tubal. Consider torsed ovary. Consider gall bladder, appendix. Will get u/s to help focus clinical picture.   U/S negative. Labs not concerning. Will get CT. Continue to manage pain.  Urinalysis reveals UTI, culture pending. Gave 1st dose of Cipro and will send home on Cipro.  CT shows severe right hydronephrosis due to a 0.6 cm right ureteral stone just below the pelvic brim. Dr. Jeraldine Loots appreciating urology consult.  As per Dr. Vernie Ammons, pt is ok to go home and call his office in the morning.   At this time there does not appear to be any evidence of an acute emergency medical condition and the patient appears stable for discharge. Diagnosis was discussed with patient who verbalizes understanding and is agreeable to discharge. Pt case discussed with Dr. Jeraldine Loots who agrees with my plan.     Glade Nurse, PA-C 04/25/12 1549

## 2012-04-25 NOTE — ED Provider Notes (Signed)
  Medical screening examination/treatment/procedure(s) were performed by non-physician practitioner and as supervising physician I was immediately available for consultation/collaboration.  I discussed the patient's case with our urologist on-call given the concern for hydronephrosis. Patient discharged in stable condition with antibiotics, analgesics  Gerhard Munch, MD 04/25/12 [phone]

## 2012-04-25 NOTE — ED Notes (Signed)
Pt complains of right sided abdominal pain. Felt constipated and has taken mirolaxx last two days.  Pt reports taking 4 ducolax on Wednesay. "It feels like someone is taking my intestines and twisting them up." She is making stool, but still having pain.

## 2012-04-27 LAB — URINE CULTURE: Colony Count: >=100000

## 2012-04-28 ENCOUNTER — Telehealth (HOSPITAL_COMMUNITY): Payer: Self-pay | Admitting: Emergency Medicine

## 2012-04-28 NOTE — ED Notes (Signed)
Patient has +Urine culture. Checking to see if appropriately treated. °

## 2012-04-28 NOTE — ED Notes (Signed)
+  Urine. Patient given Cipro. No sensitivity listed. Chart sent to EDP office for review. °

## 2012-05-02 ENCOUNTER — Telehealth (HOSPITAL_COMMUNITY): Payer: Self-pay | Admitting: Emergency Medicine

## 2012-05-03 ENCOUNTER — Telehealth (HOSPITAL_COMMUNITY): Payer: Self-pay | Admitting: Emergency Medicine

## 2012-09-20 ENCOUNTER — Emergency Department (HOSPITAL_COMMUNITY)
Admission: EM | Admit: 2012-09-20 | Discharge: 2012-09-20 | Disposition: A | Discharge status: Home / Self Care | Payer: Self-pay | Attending: Emergency Medicine | Admitting: Emergency Medicine

## 2012-09-20 ENCOUNTER — Encounter (HOSPITAL_COMMUNITY): Payer: Self-pay | Admitting: Emergency Medicine

## 2012-09-20 DIAGNOSIS — Z79899 Other long term (current) drug therapy: Secondary | ICD-10-CM | POA: Insufficient documentation

## 2012-09-20 DIAGNOSIS — R42 Dizziness and giddiness: Secondary | ICD-10-CM | POA: Insufficient documentation

## 2012-09-20 DIAGNOSIS — R110 Nausea: Secondary | ICD-10-CM | POA: Insufficient documentation

## 2012-09-20 DIAGNOSIS — G43909 Migraine, unspecified, not intractable, without status migrainosus: Secondary | ICD-10-CM | POA: Insufficient documentation

## 2012-09-20 DIAGNOSIS — Z862 Personal history of diseases of the blood and blood-forming organs and certain disorders involving the immune mechanism: Secondary | ICD-10-CM | POA: Insufficient documentation

## 2012-09-20 MED ORDER — PROCHLORPERAZINE EDISYLATE 5 MG/ML IJ SOLN
10.0000 mg | Freq: Once | INTRAMUSCULAR | Status: AC
  Administered 2012-09-20: 10 mg via INTRAVENOUS
  Filled 2012-09-20: qty 2

## 2012-09-20 MED ORDER — KETOROLAC TROMETHAMINE 30 MG/ML IJ SOLN
30.0000 mg | Freq: Once | INTRAMUSCULAR | Status: AC
  Administered 2012-09-20: 30 mg via INTRAVENOUS
  Filled 2012-09-20: qty 1

## 2012-09-20 MED ORDER — SODIUM CHLORIDE 0.9 % IV BOLUS (SEPSIS)
1000.0000 mL | Freq: Once | INTRAVENOUS | Status: AC
  Administered 2012-09-20: 1000 mL via INTRAVENOUS

## 2012-09-20 MED ORDER — DEXAMETHASONE SODIUM PHOSPHATE 10 MG/ML IJ SOLN
10.0000 mg | Freq: Once | INTRAMUSCULAR | Status: AC
  Administered 2012-09-20: 10 mg via INTRAVENOUS
  Filled 2012-09-20: qty 1

## 2012-09-20 MED ORDER — DIPHENHYDRAMINE HCL 50 MG/ML IJ SOLN
25.0000 mg | Freq: Once | INTRAMUSCULAR | Status: AC
  Administered 2012-09-20: 25 mg via INTRAVENOUS
  Filled 2012-09-20: qty 1

## 2012-09-20 NOTE — ED Provider Notes (Signed)
CSN: [id number]     Arrival date & time 09/20/12  1101 History     First MD Initiated Contact with Patient 09/20/12 1104     Chief Complaint  Patient presents with  . Migraine   (Consider location/radiation/quality/duration/timing/severity/associated sxs/prior Treatment) HPI Comments: Patient with hx migraines reports 6 days of cluster and migraine headache.  States this is the same as her prior headaches, but has lasted longer than usual.  States she has been having these headaches since she was 32 years old, has tried many medications including topamax, vicodin, imitrex without improvement in the past.  Pain begins as sharp spurts of pain all over her face, which she states is her typical cluster pattern, and then becomes constant behind right eye, which is her typical migraine pattern.  She begins with an aura of lightheadedness and nausea, which occurred 6 days ago and resolved, as per her recurrent pattern.  Has taken tylenol PM with temporary relief.  Denies head trauam, fevers, chills, neck pain or stiffness, recent illness, focal neurological deficits.  Last headache this bad was several months ago.  Gradual in onset.   The history is provided by the patient.    Past Medical History  Diagnosis Date  . Anemia   . Migraine    Past Surgical History  Procedure Laterality Date  . Cesarean section    . Wisdom tooth extraction    . Tubal ligation Left June 2012   Family History  Problem Relation Age of Onset  . Diabetes Mother   . Hypertension Mother   . Diabetes Maternal Grandmother   . Hypertension Maternal Grandmother   . Cancer Maternal Grandmother   . Diabetes Maternal Grandfather   . Hypertension Maternal Grandfather    History  Substance Use Topics  . Smoking status: Never Smoker   . Smokeless tobacco: Not on file  . Alcohol Use: No   OB History   Grav Para Term Preterm Abortions TAB SAB Ect Mult Living   6 3 3  3 1 1 1  3      Review of Systems   Constitutional: Negative for fever and chills.  HENT: Negative for neck pain.   Respiratory: Negative for cough and shortness of breath.   Cardiovascular: Negative for chest pain.  Gastrointestinal: Negative for nausea, vomiting, abdominal pain and diarrhea.  Genitourinary: Negative for dysuria, urgency, frequency, vaginal bleeding and vaginal discharge.  Musculoskeletal: Negative for gait problem.  Neurological: Positive for headaches.    Allergies  Review of patient's allergies indicates no known allergies.  Home Medications   Current Outpatient Rx  Name  Route  Sig  Dispense  Refill  . bisacodyl (DULCOLAX) 5 MG EC tablet   Oral   Take 10 mg by mouth daily as needed for constipation.         . bismuth subsalicylate (PEPTO BISMOL) 262 MG chewable tablet   Oral   Chew 524 mg by mouth as needed for indigestion or diarrhea or loose stools.         . ciprofloxacin (CIPRO) 500 MG tablet   Oral   Take 1 tablet (500 mg total) by mouth 2 (two) times daily.   14 tablet   0   . Diphenhydramine-APAP, sleep, (TYLENOL PM EXTRA STRENGTH PO)   Oral   Take 2 tablets by mouth every 6 (six) hours as needed. For headaches and pain         . oxyCODONE-acetaminophen (PERCOCET) 5-325 MG per tablet   Oral  Take 1 tablet by mouth every 4 (four) hours as needed for pain.   20 tablet   0   . polyethylene glycol (MIRALAX / GLYCOLAX) packet   Oral   Take 17 g by mouth daily.          BP 114/56  Pulse 84  Temp(Src) 98.5 F (36.9 C) (Oral)  Resp 22  SpO2 97%  LMP 09/13/2012 Physical Exam  Nursing note and vitals reviewed. Constitutional: She appears well-developed and well-nourished. No distress.  HENT:  Head: Normocephalic and atraumatic.  Neck: Neck supple.  Pulmonary/Chest: Effort normal.  Neurological: She is alert. She has normal strength. No cranial nerve deficit or sensory deficit. Gait normal. GCS eye subscore is 4. GCS verbal subscore is 5. GCS motor subscore is 6.   CN II-XII intact, EOMs intact, no pronator drift, grip strengths equal bilaterally; strength 5/5 in all extremities, sensation intact in all extremities; finger to nose, heel to shin, rapid alternating movements normal; gait is normal.     Skin: She is not diaphoretic.    ED Course   Procedures (including critical care time)  Labs Reviewed - No data to display No results found.  1:54 PM Patient reports she is much more comfortable now and would like to be discharged.   Her pain has gone from a "15"/10 to an 8/10.     1. Migraine     MDM  Pt with history of both cluster headache and migraine headaches, presenting with her typical pattern of headache, not relieved with home Tylenol PM.  She is followed at [facility] neurology.  No red flags for headache.  Treated with O2 by nasal canula for cluster component and migraine cocktail with IVF. Pt feeling relief with medications, requesting d/c.  Will f/u with neurology. Pt given return precautions.  Pt verbalizes understanding and agrees with plan.     Trixie Dredge, PA-C 09/20/12 [phone]

## 2012-09-20 NOTE — ED Notes (Signed)
Pt ED with c/o headache with nausea, dizziness and blurred vision x7 days. Hx of migraine. Pt is tearful and states feels hurting her self of this bad headache.

## 2012-09-21 NOTE — ED Provider Notes (Signed)
Medical screening examination/treatment/procedure(s) were performed by non-physician practitioner and as supervising physician I was immediately available for consultation/collaboration.  Anysa Tacey T Jazae Gandolfi, MD 09/21/12 1210 

## 2013-01-26 ENCOUNTER — Emergency Department (HOSPITAL_COMMUNITY)
Admission: EM | Admit: 2013-01-26 | Discharge: 2013-01-26 | Disposition: A | Discharge status: Home / Self Care | Payer: Self-pay | Attending: Emergency Medicine | Admitting: Emergency Medicine

## 2013-01-26 ENCOUNTER — Encounter (HOSPITAL_COMMUNITY): Payer: Self-pay | Admitting: Emergency Medicine

## 2013-01-26 ENCOUNTER — Emergency Department (HOSPITAL_COMMUNITY): Payer: Self-pay

## 2013-01-26 DIAGNOSIS — Z8679 Personal history of other diseases of the circulatory system: Secondary | ICD-10-CM | POA: Insufficient documentation

## 2013-01-26 DIAGNOSIS — J029 Acute pharyngitis, unspecified: Secondary | ICD-10-CM | POA: Insufficient documentation

## 2013-01-26 DIAGNOSIS — R509 Fever, unspecified: Secondary | ICD-10-CM

## 2013-01-26 DIAGNOSIS — R52 Pain, unspecified: Secondary | ICD-10-CM | POA: Insufficient documentation

## 2013-01-26 DIAGNOSIS — Z862 Personal history of diseases of the blood and blood-forming organs and certain disorders involving the immune mechanism: Secondary | ICD-10-CM | POA: Insufficient documentation

## 2013-01-26 DIAGNOSIS — R5381 Other malaise: Secondary | ICD-10-CM | POA: Insufficient documentation

## 2013-01-26 LAB — RAPID STREP SCREEN (MED CTR MEBANE ONLY): Streptococcus, Group A Screen (Direct): NEGATIVE

## 2013-01-26 MED ORDER — HYDROCODONE-ACETAMINOPHEN 7.5-325 MG/15ML PO SOLN: 15.0000 mL | Freq: Four times a day (QID) | ORAL | Status: DC | PRN

## 2013-01-26 MED ORDER — IBUPROFEN 400 MG PO TABS
400.0000 mg | ORAL_TABLET | Freq: Once | ORAL | Status: AC
  Administered 2013-01-26: 400 mg via ORAL
  Filled 2013-01-26: qty 1

## 2013-01-26 MED ORDER — ACETAMINOPHEN 500 MG PO TABS
1000.0000 mg | ORAL_TABLET | Freq: Once | ORAL | Status: AC
  Administered 2013-01-26: 1000 mg via ORAL
  Filled 2013-01-26: qty 2

## 2013-01-26 NOTE — ED Notes (Signed)
Reports having headache that started last night and then sore throat and fevers today. HR 136 at triage, white patches noted in throat. Airway is intact.

## 2013-01-26 NOTE — ED Notes (Signed)
Patient is alert and orientedx4.  Patient was explained discharge instructions and they understood them with no questions.  The patient's mother, Amairany Schumpert is taking the patient home.

## 2013-01-26 NOTE — ED Provider Notes (Signed)
CSN: [id number]     Arrival date & time 01/26/13  1539 History   First MD Initiated Contact with Patient 01/26/13 1808     Chief Complaint  Patient presents with  . Sore Throat  . Fever    HPI Pt was seen at 1810.  Per pt, c/o gradual onset and persistence of constant sore throat, runny/stuffy nose, and sinus congestion that began last night. Has been associated with generalized body aches/fatigue and home fevers. States her 2 children have similar symptoms. Denies rash, no CP/SOB, no N/V/D, no abd pain.    Past Medical History  Diagnosis Date  . Anemia   . Migraine    Past Surgical History  Procedure Laterality Date  . Cesarean section    . Wisdom tooth extraction    . Tubal ligation Left June 2012   Family History  Problem Relation Age of Onset  . Diabetes Mother   . Hypertension Mother   . Diabetes Maternal Grandmother   . Hypertension Maternal Grandmother   . Cancer Maternal Grandmother   . Diabetes Maternal Grandfather   . Hypertension Maternal Grandfather    History  Substance Use Topics  . Smoking status: Never Smoker   . Smokeless tobacco: Not on file  . Alcohol Use: No   OB History   Grav Para Term Preterm Abortions TAB SAB Ect Mult Living   6 3 3  3 1 1 1  3      Review of Systems ROS: Statement: All systems negative except as marked or noted in the HPI; Constitutional: +fever and chills, generalized body aches/fatigue.. ; ; Eyes: Negative for eye pain, redness and discharge. ; ; ENMT: Negative for ear pain, hoarseness, +nasal congestion, sinus pressure and sore throat. ; ; Cardiovascular: Negative for chest pain, palpitations, diaphoresis, dyspnea and peripheral edema. ; ; Respiratory: Negative for cough, wheezing and stridor. ; ; Gastrointestinal: Negative for nausea, vomiting, diarrhea, abdominal pain, blood in stool, hematemesis, jaundice and rectal bleeding. . ; ; Genitourinary: Negative for dysuria, flank pain and hematuria. ; ; Musculoskeletal: Negative  for back pain and neck pain. Negative for swelling and trauma.; ; Skin: Negative for pruritus, rash, abrasions, blisters, bruising and skin lesion.; ; Neuro: Negative for headache, lightheadedness and neck stiffness. Negative for weakness, altered level of consciousness , altered mental status, extremity weakness, paresthesias, involuntary movement, seizure and syncope.      Allergies  Review of patient's allergies indicates no known allergies.  Home Medications   Current Outpatient Rx  Name  Route  Sig  Dispense  Refill  . Diphenhydramine-APAP, sleep, (TYLENOL PM EXTRA STRENGTH PO)   Oral   Take 2 tablets by mouth every 6 (six) hours as needed. For headaches and pain         . Phenol (CHLORASEPTIC MT)   Mouth/Throat   Use as directed 2 sprays in the mouth or throat every 4 (four) hours as needed (sore throat).          BP 124/67  Pulse 113  Temp(Src) 100.9 F (38.3 C) (Oral)  Resp 16  SpO2 98%  LMP 01/19/2013 Physical Exam 1815: Physical examination:  Nursing notes reviewed; Vital signs and O2 SAT reviewed; +fever;; Constitutional: Well developed, Well nourished, Well hydrated, In no acute distress; Head:  Normocephalic, atraumatic; Eyes: EOMI, PERRL, No scleral icterus; ENMT: TM's clear. +edemetous nasal turbinates bilat with clear rhinorrhea. Mouth and pharynx without lesions. No tonsillar exudates. No intra-oral edema. No submandibular or sublingual edema. No hoarse voice, no  drooling, no stridor. No pain with manipulation of larynx.  Mouth and pharynx normal, Mucous membranes moist; Neck: Supple, Full range of motion, No lymphadenopathy; Cardiovascular: Tachycardic rate and rhythm, No gallop; Respiratory: Breath sounds clear & equal bilaterally, No rales, rhonchi, wheezes.  Speaking full sentences with ease, Normal respiratory effort/excursion; Chest: Nontender, Movement normal; Abdomen: Soft, Nontender, Nondistended, Normal bowel sounds; Genitourinary: No CVA tenderness;  Extremities: Pulses normal, No tenderness, No edema, No calf edema or asymmetry.; Neuro: AA&Ox3, Major CN grossly intact.  Speech clear. No gross focal motor or sensory deficits in extremities.; Skin: Color normal, Warm, Dry.   ED Course  Procedures    EKG Interpretation   None       MDM  MDM Reviewed: previous chart, nursing note and vitals Interpretation: labs and x-ray     Results for orders placed during the hospital encounter of 01/26/13  RAPID STREP SCREEN      Result Value Range   Streptococcus, Group A Screen (Direct) NEGATIVE  NEGATIVE   Dg Chest 2 View 01/26/2013   CLINICAL DATA:  Fever  EXAM: CHEST  2 VIEW  COMPARISON:  Jun 14, 2010  FINDINGS: Lungs are clear. The heart size and pulmonary vascularity are normal. No adenopathy. No bone lesions.  IMPRESSION: No abnormality noted.   Electronically Signed   By: Bretta Bang M.D.   On: 01/26/2013 20:25    2040:  Workup reassuring. Has tol PO well without N/V. Throat C&S pending. Will tx symptomatically at this time. Pt feels better after meds and wants to go home now. Dx and testing d/w pt.  Questions answered.  Verb understanding, agreeable to d/c home with outpt f/u.      Laray Anger, DO 01/29/13 [phone]

## 2013-01-28 LAB — CULTURE, GROUP A STREP

## 2013-01-29 ENCOUNTER — Telehealth (HOSPITAL_COMMUNITY): Payer: Self-pay | Admitting: *Deleted

## 2013-01-29 NOTE — ED Notes (Addendum)
Post ED Visit - Positive Culture Follow-up: Successful Patient Follow-Up  Culture assessed and recommendations reviewed by: []  Wes Dulaney, Pharm.D., BCPS [x]  Celedonio Miyamoto, Pharm.D., BCPS []  Georgina Pillion, Pharm.D., BCPS []  Olivia, [address].D., BCPS, AAHIVP []  Estella Husk, Pharm.D., BCPS, AAHIVP  Positive strep culture [X]  Patient discharged originally without antimicrobial agent and treatment is now indicated  New antibiotic prescription: amoxicillin 500mg  po BID x 10 days  ED Provider: Francee Piccolo, PA-C   Larena Sox 01/29/2013, 12:43 PM

## 2013-01-29 NOTE — ED Notes (Signed)
Patient returned call and requested that rx be called to [facility]  [phone]

## 2013-01-29 NOTE — Progress Notes (Signed)
ED Antimicrobial Stewardship Positive Culture Follow Up   [name] is an 32 y.o. female who presented to [facility] on 01/26/2013 with a chief complaint of  Chief Complaint  Patient presents with  . Sore Throat  . Fever    Recent Results (from the past 720 hour(s))  RAPID STREP SCREEN     Status: None   Collection Time    01/26/13  3:52 PM      Result Value Range Status   Streptococcus, Group A Screen (Direct) NEGATIVE  NEGATIVE Final   Comment: (NOTE)     A Rapid Antigen test may result negative if the antigen level in the     sample is below the detection level of this test. The FDA has not     cleared this test as a stand-alone test therefore the rapid antigen     negative result has reflexed to a Group A Strep culture.  CULTURE, GROUP A STREP     Status: None   Collection Time    01/26/13  3:52 PM      Result Value Range Status   Specimen Description THROAT   Final   Special Requests NONE   Final   Culture     Final   Value: GROUP A STREP (S.PYOGENES) ISOLATED     Performed at [facility]   Report Status 01/28/2013 FINAL   Final     [x]  Patient discharged originally without antimicrobial agent and treatment is now indicated  New antibiotic prescription: amoxicillin 500mg  po BID x 10 days  ED Provider: Francee Piccolo, PA-C   [name] 01/29/2013, 9:56 AM Infectious Diseases Pharmacist Phone# [phone]

## 2013-06-15 ENCOUNTER — Emergency Department (HOSPITAL_COMMUNITY)
Admission: EM | Admit: 2013-06-15 | Discharge: 2013-06-15 | Disposition: A | Discharge status: Home / Self Care | Payer: Self-pay | Attending: Emergency Medicine | Admitting: Emergency Medicine

## 2013-06-15 ENCOUNTER — Encounter (HOSPITAL_COMMUNITY): Payer: Self-pay | Admitting: Emergency Medicine

## 2013-06-15 ENCOUNTER — Emergency Department (HOSPITAL_COMMUNITY): Payer: Medicaid Other

## 2013-06-15 DIAGNOSIS — Z8679 Personal history of other diseases of the circulatory system: Secondary | ICD-10-CM | POA: Insufficient documentation

## 2013-06-15 DIAGNOSIS — Z87442 Personal history of urinary calculi: Secondary | ICD-10-CM | POA: Insufficient documentation

## 2013-06-15 DIAGNOSIS — R110 Nausea: Secondary | ICD-10-CM | POA: Insufficient documentation

## 2013-06-15 DIAGNOSIS — Z3202 Encounter for pregnancy test, result negative: Secondary | ICD-10-CM | POA: Insufficient documentation

## 2013-06-15 DIAGNOSIS — N390 Urinary tract infection, site not specified: Secondary | ICD-10-CM | POA: Insufficient documentation

## 2013-06-15 HISTORY — DX: Calculus of kidney: N20.0

## 2013-06-15 LAB — URINALYSIS, ROUTINE W REFLEX MICROSCOPIC
Bilirubin Urine: NEGATIVE
Glucose, UA: NEGATIVE mg/dL
Ketones, ur: 15 mg/dL — AB
NITRITE: POSITIVE — AB
PH: 5.5 (ref 5.0–8.0)
Protein, ur: NEGATIVE mg/dL
SPECIFIC GRAVITY, URINE: 1.028 (ref 1.005–1.030)
Urobilinogen, UA: 0.2 mg/dL (ref 0.0–1.0)

## 2013-06-15 LAB — CBC WITH DIFFERENTIAL/PLATELET
Basophils Absolute: 0 10*3/uL (ref 0.0–0.1)
Basophils Relative: 1 % (ref 0–1)
Eosinophils Absolute: 0.3 10*3/uL (ref 0.0–0.7)
Eosinophils Relative: 3 % (ref 0–5)
HCT: 39.1 % (ref 36.0–46.0)
HEMOGLOBIN: 12.1 g/dL (ref 12.0–15.0)
LYMPHS ABS: 2.8 10*3/uL (ref 0.7–4.0)
LYMPHS PCT: 32 % (ref 12–46)
MCH: 22 pg — ABNORMAL LOW (ref 26.0–34.0)
MCHC: 30.9 g/dL (ref 30.0–36.0)
MCV: 71.2 fL — ABNORMAL LOW (ref 78.0–100.0)
MONO ABS: 0.5 10*3/uL (ref 0.1–1.0)
MONOS PCT: 5 % (ref 3–12)
NEUTROS ABS: 5.2 10*3/uL (ref 1.7–7.7)
NEUTROS PCT: 59 % (ref 43–77)
Platelets: 321 10*3/uL (ref 150–400)
RBC: 5.49 MIL/uL — AB (ref 3.87–5.11)
RDW: 14 % (ref 11.5–15.5)
WBC: 8.8 10*3/uL (ref 4.0–10.5)

## 2013-06-15 LAB — BASIC METABOLIC PANEL
BUN: 8 mg/dL (ref 6–23)
CHLORIDE: 99 meq/L (ref 96–112)
CO2: 25 meq/L (ref 19–32)
CREATININE: 0.74 mg/dL (ref 0.50–1.10)
Calcium: 9 mg/dL (ref 8.4–10.5)
GFR calc Af Amer: >90 mL/min (ref >90)
GFR calc non Af Amer: >90 mL/min (ref >90)
GLUCOSE: 113 mg/dL — AB (ref 70–99)
POTASSIUM: 4.3 meq/L (ref 3.7–5.3)
Sodium: 135 mEq/L — ABNORMAL LOW (ref 137–147)

## 2013-06-15 LAB — URINE MICROSCOPIC-ADD ON

## 2013-06-15 LAB — LIPASE, BLOOD: Lipase: 17 U/L (ref 11–59)

## 2013-06-15 LAB — PREGNANCY, URINE: Preg Test, Ur: NEGATIVE

## 2013-06-15 MED ORDER — KETOROLAC TROMETHAMINE 30 MG/ML IJ SOLN
30.0000 mg | Freq: Once | INTRAMUSCULAR | Status: AC
  Administered 2013-06-15: 30 mg via INTRAMUSCULAR
  Filled 2013-06-15: qty 1

## 2013-06-15 MED ORDER — HYDROCODONE-ACETAMINOPHEN 5-325 MG PO TABS: 1.0000 | ORAL_TABLET | Freq: Four times a day (QID) | ORAL | Status: DC | PRN

## 2013-06-15 MED ORDER — KETOROLAC TROMETHAMINE 30 MG/ML IJ SOLN: 30.0000 mg | Freq: Once | INTRAMUSCULAR | Status: DC

## 2013-06-15 MED ORDER — OXYCODONE-ACETAMINOPHEN 5-325 MG PO TABS
1.0000 | ORAL_TABLET | Freq: Once | ORAL | Status: AC
  Administered 2013-06-15: 1 via ORAL
  Filled 2013-06-15: qty 1

## 2013-06-15 MED ORDER — CEPHALEXIN 500 MG PO CAPS: 500.0000 mg | ORAL_CAPSULE | Freq: Four times a day (QID) | ORAL | Status: DC

## 2013-06-15 NOTE — Discharge Instructions (Signed)
Urinary Tract Infection  Urinary tract infections (UTIs) can develop anywhere along your urinary tract. Your urinary tract is your body's drainage system for removing wastes and extra water. Your urinary tract includes two kidneys, two ureters, a bladder, and a urethra. Your kidneys are a pair of bean-shaped organs. Each kidney is about the size of your fist. They are located below your ribs, one on each side of your spine.  CAUSES  Infections are caused by microbes, which are microscopic organisms, including fungi, viruses, and bacteria. These organisms are so small that they can only be seen through a microscope. Bacteria are the microbes that most commonly cause UTIs.  SYMPTOMS   Symptoms of UTIs may vary by age and gender of the patient and by the location of the infection. Symptoms in young women typically include a frequent and intense urge to urinate and a painful, burning feeling in the bladder or urethra during urination. Older women and men are more likely to be tired, shaky, and weak and have muscle aches and abdominal pain. A fever may mean the infection is in your kidneys. Other symptoms of a kidney infection include pain in your back or sides below the ribs, nausea, and vomiting.  DIAGNOSIS  To diagnose a UTI, your caregiver will ask you about your symptoms. Your caregiver also will ask to provide a urine sample. The urine sample will be tested for bacteria and white blood cells. White blood cells are made by your body to help fight infection.  TREATMENT   Typically, UTIs can be treated with medication. Because most UTIs are caused by a bacterial infection, they usually can be treated with the use of antibiotics. The choice of antibiotic and length of treatment depend on your symptoms and the type of bacteria causing your infection.  HOME CARE INSTRUCTIONS   If you were prescribed antibiotics, take them exactly as your caregiver instructs you. Finish the medication even if you feel better after you  have only taken some of the medication.   Drink enough water and fluids to keep your urine clear or pale yellow.   Avoid caffeine, tea, and carbonated beverages. They tend to irritate your bladder.   Empty your bladder often. Avoid holding urine for long periods of time.   Empty your bladder before and after sexual intercourse.   After a bowel movement, women should cleanse from front to back. Use each tissue only once.  SEEK MEDICAL CARE IF:    You have back pain.   You develop a fever.   Your symptoms do not begin to resolve within 3 days.  SEEK IMMEDIATE MEDICAL CARE IF:    You have severe back pain or lower abdominal pain.   You develop chills.   You have nausea or vomiting.   You have continued burning or discomfort with urination.  MAKE SURE YOU:    Understand these instructions.   Will watch your condition.   Will get help right away if you are not doing well or get worse.  Document Released: 10/26/2004 Document Revised: 07/18/2011 Document Reviewed: 02/24/2011  [employer] Patient Information 2014 [employer].

## 2013-06-15 NOTE — ED Notes (Signed)
Pt c/o left sided flank pain onset 0800 today. Pt reports that she [place] the water park yesterday and got on the rides that jolted her around. Pt has history of kidney stones.

## 2013-06-15 NOTE — ED Notes (Signed)
Pt. Refusing IV. Would like medicine IM. Molly Maduroobert, PA notified

## 2013-06-15 NOTE — ED Provider Notes (Signed)
CSN: [id number]     Arrival date & time 06/15/13  1518 History   First MD Initiated Contact with Patient 06/15/13 1930     Chief Complaint  Patient presents with  . Flank Pain    Left side     (Consider location/radiation/quality/duration/timing/severity/associated sxs/prior Treatment) HPI Comments: Patient presents emergency department with chief complaint of constant, moderate, left flank pain that started this morning at 8:00. She reports history of kidney stones. She states the pain feels similar. She denies any vomiting, or dysuria. She states that she has had intermittent episodes of nausea. She denies any fevers chills. She has not tried taking anything to alleviate her symptoms. There are no aggravating or alleviating factors. Additionally, she states that she was at a waterpark and was jolted around on some of the rides.  The history is provided by the patient. No language interpreter was used.    Past Medical History  Diagnosis Date  . Anemia   . Migraine   . Kidney stone    Past Surgical History  Procedure Laterality Date  . Cesarean section    . Wisdom tooth extraction    . Tubal ligation Left June 2012   Family History  Problem Relation Age of Onset  . Diabetes Mother   . Hypertension Mother   . Diabetes Maternal Grandmother   . Hypertension Maternal Grandmother   . Cancer Maternal Grandmother   . Diabetes Maternal Grandfather   . Hypertension Maternal Grandfather    History  Substance Use Topics  . Smoking status: Never Smoker   . Smokeless tobacco: Not on file  . Alcohol Use: No   OB History   Grav Para Term Preterm Abortions TAB SAB Ect Mult Living   6 3 3  3 1 1 1  3      Review of Systems  Constitutional: Negative for fever and chills.  Respiratory: Negative for shortness of breath.   Cardiovascular: Negative for chest pain.  Gastrointestinal: Negative for nausea, vomiting, diarrhea and constipation.  Genitourinary: Positive for flank pain and  vaginal bleeding (on menstrual). Negative for dysuria.      Allergies  Review of patient's allergies indicates no known allergies.  Home Medications   Prior to Admission medications   Medication Sig Start Date End Date Taking? Authorizing Provider  diphenhydrAMINE (SOMINEX) 25 MG tablet Take 50 mg by mouth at bedtime as needed for allergies.   Yes Historical Provider, MD  Diphenhydramine-APAP, sleep, (TYLENOL PM EXTRA STRENGTH PO) Take 2 tablets by mouth every 6 (six) hours as needed. For headaches and pain   Yes Historical Provider, MD   BP 120/78  Pulse 87  Temp(Src) 99.4 F (37.4 C) (Oral)  Resp 18  Ht 5\' 6"  (1.676 m)  Wt 205 lb (92.987 kg)  BMI 33.10 kg/m2  SpO2 99%  LMP 06/12/2013 Physical Exam  Nursing note and vitals reviewed. Constitutional: She is oriented to person, place, and time. She appears well-developed and well-nourished.  HENT:  Head: Normocephalic and atraumatic.  Eyes: Conjunctivae and EOM are normal. Pupils are equal, round, and reactive to light.  Neck: Normal range of motion. Neck supple.  Cardiovascular: Normal rate and regular rhythm.  Exam reveals no gallop and no friction rub.   No murmur heard. Pulmonary/Chest: Effort normal and breath sounds normal. No respiratory distress. She has no wheezes. She has no rales. She exhibits no tenderness.  Abdominal: Soft. Bowel sounds are normal. She exhibits no distension and no mass. There is no tenderness. There  is no rebound and no guarding.  No focal abdominal tenderness, no RLQ tenderness or pain at McBurney's point, no RUQ tenderness or Murphy's sign, no left-sided abdominal tenderness, no fluid wave, or signs of peritonitis  No CVA tenderness  Musculoskeletal: Normal range of motion. She exhibits no edema and no tenderness.  Neurological: She is alert and oriented to person, place, and time.  Skin: Skin is warm and dry.  Psychiatric: She has a normal mood and affect. Her behavior is normal. Judgment and  thought content normal.    ED Course  Procedures (including critical care time) Results for orders placed during the hospital encounter of 06/15/13  URINALYSIS, ROUTINE W REFLEX MICROSCOPIC      Result Value Ref Range   Color, Urine AMBER (*) YELLOW   APPearance CLOUDY (*) CLEAR   Specific Gravity, Urine 1.028  1.005 - 1.030   pH 5.5  5.0 - 8.0   Glucose, UA NEGATIVE  NEGATIVE mg/dL   Hgb urine dipstick LARGE (*) NEGATIVE   Bilirubin Urine NEGATIVE  NEGATIVE   Ketones, ur 15 (*) NEGATIVE mg/dL   Protein, ur NEGATIVE  NEGATIVE mg/dL   Urobilinogen, UA 0.2  0.0 - 1.0 mg/dL   Nitrite POSITIVE (*) NEGATIVE   Leukocytes, UA SMALL (*) NEGATIVE  CBC WITH DIFFERENTIAL      Result Value Ref Range   WBC 8.8  4.0 - 10.5 K/uL   RBC 5.49 (*) 3.87 - 5.11 MIL/uL   Hemoglobin 12.1  12.0 - 15.0 g/dL   HCT 16.139.1  09.636.0 - 04.546.0 %   MCV 71.2 (*) 78.0 - 100.0 fL   MCH 22.0 (*) 26.0 - 34.0 pg   MCHC 30.9  30.0 - 36.0 g/dL   RDW 40.914.0  81.111.5 - 91.415.5 %   Platelets 321  150 - 400 K/uL   Neutrophils Relative % 59  43 - 77 %   Neutro Abs 5.2  1.7 - 7.7 K/uL   Lymphocytes Relative 32  12 - 46 %   Lymphs Abs 2.8  0.7 - 4.0 K/uL   Monocytes Relative 5  3 - 12 %   Monocytes Absolute 0.5  0.1 - 1.0 K/uL   Eosinophils Relative 3  0 - 5 %   Eosinophils Absolute 0.3  0.0 - 0.7 K/uL   Basophils Relative 1  0 - 1 %   Basophils Absolute 0.0  0.0 - 0.1 K/uL  BASIC METABOLIC PANEL      Result Value Ref Range   Sodium 135 (*) 137 - 147 mEq/L   Potassium 4.3  3.7 - 5.3 mEq/L   Chloride 99  96 - 112 mEq/L   CO2 25  19 - 32 mEq/L   Glucose, Bld 113 (*) 70 - 99 mg/dL   BUN 8  6 - 23 mg/dL   Creatinine, Ser 7.820.74  0.50 - 1.10 mg/dL   Calcium 9.0  8.4 - 95.610.5 mg/dL   GFR calc non Af Amer >90  >90 mL/min   GFR calc Af Amer >90  >90 mL/min  PREGNANCY, URINE      Result Value Ref Range   Preg Test, Ur NEGATIVE  NEGATIVE  LIPASE, BLOOD      Result Value Ref Range   Lipase 17  11 - 59 U/L  URINE MICROSCOPIC-ADD ON       Result Value Ref Range   Squamous Epithelial / LPF RARE  RARE   WBC, UA 21-50  <3 WBC/hpf   RBC / HPF  0-2  <3 RBC/hpf   Bacteria, UA MANY (*) RARE   [place] Renal  06/15/2013   CLINICAL DATA:  Left flank pain  EXAM: RENAL/URINARY TRACT ULTRASOUND COMPLETE  COMPARISON:  None.  FINDINGS: Right Kidney:  Length: 12.3 cm. Echogenicity within normal limits. No mass or hydronephrosis visualized.  Left Kidney:  Length: 11.7 cm. Echogenicity within normal limits. No mass or hydronephrosis visualized.  Bladder:  Appears normal for degree of bladder distention.  IMPRESSION: Normal renal ultrasound   Electronically Signed   By: Sherian ReinWei-Chen  Lin M.D.   On: 06/15/2013 22:01      EKG Interpretation None      MDM   Final diagnoses:  None    Patient with history of kidney stones. She has never required an intervention. Will check UA, and labs. Will treat pain. Will order a renal US.  Patient feels improved.  Labs remarkable for UTI. Will treat with Keflex. Discharged home.  PCP follow-up.    Roxy Horsemanobert Farren Nelles, PA-C 06/15/13 2247

## 2013-06-16 NOTE — ED Provider Notes (Signed)
Medical screening examination/treatment/procedure(s) were performed by non-physician practitioner and as supervising physician I was immediately available for consultation/collaboration.   EKG Interpretation None        Eliab Closson, MD 06/16/13 0054 

## 2013-12-01 ENCOUNTER — Encounter (HOSPITAL_COMMUNITY): Payer: Self-pay | Admitting: Emergency Medicine

## 2016-05-03 ENCOUNTER — Ambulatory Visit: Payer: Medicaid Other | Admitting: Obstetrics

## 2016-09-25 ENCOUNTER — Emergency Department (HOSPITAL_COMMUNITY)
Admission: EM | Admit: 2016-09-25 | Discharge: 2016-09-25 | Disposition: A | Discharge status: Home / Self Care | Payer: 59 | Attending: Emergency Medicine | Admitting: Emergency Medicine

## 2016-09-25 ENCOUNTER — Emergency Department (HOSPITAL_COMMUNITY): Payer: 59

## 2016-09-25 ENCOUNTER — Encounter (HOSPITAL_COMMUNITY): Payer: Self-pay | Admitting: Emergency Medicine

## 2016-09-25 DIAGNOSIS — R197 Diarrhea, unspecified: Secondary | ICD-10-CM | POA: Diagnosis not present

## 2016-09-25 DIAGNOSIS — R112 Nausea with vomiting, unspecified: Secondary | ICD-10-CM

## 2016-09-25 DIAGNOSIS — R1084 Generalized abdominal pain: Secondary | ICD-10-CM | POA: Insufficient documentation

## 2016-09-25 LAB — URINALYSIS, ROUTINE W REFLEX MICROSCOPIC
Bilirubin Urine: NEGATIVE
Glucose, UA: NEGATIVE mg/dL
Hgb urine dipstick: NEGATIVE
KETONES UR: NEGATIVE mg/dL
LEUKOCYTES UA: NEGATIVE
NITRITE: NEGATIVE
Protein, ur: NEGATIVE mg/dL
Specific Gravity, Urine: 1.02 (ref 1.005–1.030)
pH: 5 (ref 5.0–8.0)

## 2016-09-25 LAB — CBC
HCT: 39.4 % (ref 36.0–46.0)
Hemoglobin: 11.9 g/dL — ABNORMAL LOW (ref 12.0–15.0)
MCH: 21.1 pg — ABNORMAL LOW (ref 26.0–34.0)
MCHC: 30.2 g/dL (ref 30.0–36.0)
MCV: 70 fL — ABNORMAL LOW (ref 78.0–100.0)
Platelets: 344 10*3/uL (ref 150–400)
RBC: 5.63 MIL/uL — ABNORMAL HIGH (ref 3.87–5.11)
RDW: 13.8 % (ref 11.5–15.5)
WBC: 7.8 10*3/uL (ref 4.0–10.5)

## 2016-09-25 LAB — COMPREHENSIVE METABOLIC PANEL
ALT: 10 U/L — AB (ref 14–54)
AST: 20 U/L (ref 15–41)
Albumin: 3.8 g/dL (ref 3.5–5.0)
Alkaline Phosphatase: 51 U/L (ref 38–126)
Anion gap: 8 (ref 5–15)
BILIRUBIN TOTAL: 0.4 mg/dL (ref 0.3–1.2)
BUN: 6 mg/dL (ref 6–20)
CO2: 24 mmol/L (ref 22–32)
CREATININE: 0.85 mg/dL (ref 0.44–1.00)
Calcium: 9.1 mg/dL (ref 8.9–10.3)
Chloride: 105 mmol/L (ref 101–111)
GFR calc Af Amer: >60 mL/min (ref >60)
Glucose, Bld: 145 mg/dL — ABNORMAL HIGH (ref 65–99)
Potassium: 3.9 mmol/L (ref 3.5–5.1)
Sodium: 137 mmol/L (ref 135–145)
TOTAL PROTEIN: 7.6 g/dL (ref 6.5–8.1)

## 2016-09-25 LAB — I-STAT BETA HCG BLOOD, ED (MC, WL, AP ONLY): I-stat hCG, quantitative: <5 m[IU]/mL (ref <5)

## 2016-09-25 LAB — LIPASE, BLOOD: Lipase: 24 U/L (ref 11–51)

## 2016-09-25 MED ORDER — ONDANSETRON 4 MG PO TBDP: 4.0000 mg | ORAL_TABLET | Freq: Three times a day (TID) | ORAL | 0 refills | Status: DC | PRN

## 2016-09-25 MED ORDER — IOPAMIDOL (ISOVUE-300) INJECTION 61%
INTRAVENOUS | Status: AC
  Administered 2016-09-25: 100 mL
  Filled 2016-09-25: qty 100

## 2016-09-25 MED ORDER — KETOROLAC TROMETHAMINE 30 MG/ML IJ SOLN
15.0000 mg | Freq: Once | INTRAMUSCULAR | Status: AC
  Administered 2016-09-25: 15 mg via INTRAVENOUS
  Filled 2016-09-25: qty 1

## 2016-09-25 MED ORDER — DICYCLOMINE HCL 10 MG/ML IM SOLN
20.0000 mg | Freq: Once | INTRAMUSCULAR | Status: AC
  Administered 2016-09-25: 20 mg via INTRAMUSCULAR
  Filled 2016-09-25: qty 2

## 2016-09-25 MED ORDER — DICYCLOMINE HCL 20 MG PO TABS: 20.0000 mg | ORAL_TABLET | Freq: Two times a day (BID) | ORAL | 0 refills | Status: DC

## 2016-09-25 MED ORDER — METOCLOPRAMIDE HCL 5 MG/ML IJ SOLN
10.0000 mg | Freq: Once | INTRAMUSCULAR | Status: AC
  Administered 2016-09-25: 10 mg via INTRAVENOUS
  Filled 2016-09-25: qty 2

## 2016-09-25 MED ORDER — GI COCKTAIL ~~LOC~~
30.0000 mL | Freq: Once | ORAL | Status: AC
  Administered 2016-09-25: 30 mL via ORAL
  Filled 2016-09-25: qty 30

## 2016-09-25 MED ORDER — OMEPRAZOLE 20 MG PO CPDR: 20.0000 mg | DELAYED_RELEASE_CAPSULE | Freq: Every day | ORAL | 0 refills | Status: DC

## 2016-09-25 NOTE — Discharge Instructions (Signed)
Your abdominal pain is likely from gastritis, reflux or a stomach ulcer. You will need to take the prescribed proton pump inhibitor as directed, and avoid spicy/fatty/acidic foods. Avoid laying down flat within 30 minutes of eating. Avoid NSAIDs like ibuprofen or Aleve on an empty stomach. Use zofran as needed for nausea. Follow up with the gastroenterologist (GI doctor) listed for ongoing evaluation of your abdominal pain. Return to the ER for new or worsening symptoms, any additional concers. ° ° °SEEK IMMEDIATE MEDICAL ATTENTION IF YOU DEVELOP ANY OF THE FOLLOWING SYMPTOMS: °The pain does not go away or becomes severe.  °A temperature above 101 develops.  °Repeated vomiting occurs (multiple episodes).  °Blood is being passed in stools or vomit (bright red or black tarry stools).  °Return also if you develop chest pain, difficulty breathing, dizziness or fainting ° °

## 2016-09-25 NOTE — ED Notes (Signed)
Pt states IV fluids (NS) make her itch.

## 2016-09-25 NOTE — ED Notes (Signed)
To CT scan

## 2016-09-25 NOTE — ED Provider Notes (Signed)
[facility]-EMERGENCY DEPT Provider Note   CSN: [id number] Arrival date & time: 09/25/16  1324     History   Chief Complaint Chief Complaint  Patient presents with  . Abdominal Pain    HPI [name] is a 36 y.o. female.  HPI 36 year old AA female past medical history significant for kidney stones, anemia, ectopic pregnancy presents to the emergency Department today with complaints of generalized abdominal pain that has progressively worsen since one week ago. The patient states that she's also been experiencing nausea, vomiting and diarrhea intermittently over the past week. Patient states she took Tylenol for pain with some relief. Patient states the pain more generalized abdominal pain specifically epigastric region. Patient denies any recent antibiotic use. Denies any recent travel, recent sick contacts. Patient describes the pain as cramping in nature. Patient states she has had a few episodes of nonbloody nonbilious emesis over the past week. Also reports nonbloody diarrhea over the past week intermittently. Patient denies any urinary symptoms or vaginal symptoms. Denies any pelvic pain.  Denies any similar history of same. Patient denies any abdominal surgeries. Last normal bowel movement was this morning.  Pt denies any fever, chill, ha, vision changes, lightheadedness, dizziness, congestion, neck pain, cp, sob, cough, urinary symptoms, vaginal bleeding, vaginal discharge, melena, hematochezia, lower extremity paresthesias.  Past Medical History:  Diagnosis Date  . Anemia   . Kidney stone   . Migraine     There are no active problems to display for this patient.   Past Surgical History:  Procedure Laterality Date  . CESAREAN SECTION    . TUBAL LIGATION Left June 2012  . WISDOM TOOTH EXTRACTION      OB History    Gravida Para Term Preterm AB Living   6 3 3   3 3    SAB TAB Ectopic Multiple Live Births   1 1 1            Home Medications    Prior to Admission  medications   Medication Sig Start Date End Date Taking? Authorizing Provider  diphenhydramine-acetaminophen (TYLENOL PM) 25-500 MG TABS Take 2 tablets by mouth 2 (two) times daily as needed (headaches/pain).   Yes [provider]  HYDROcodone-acetaminophen (NORCO/VICODIN) 5-325 MG per tablet Take 1-2 tablets by mouth every 6 (six) hours as needed. Patient not taking: Reported on 09/25/2016 06/15/13   Roxy Horseman, PA-C    Family History Family History  Problem Relation Age of Onset  . Diabetes Mother   . Hypertension Mother   . Diabetes Maternal Grandmother   . Hypertension Maternal Grandmother   . Cancer Maternal Grandmother   . Diabetes Maternal Grandfather   . Hypertension Maternal Grandfather     Social History Social History  Substance Use Topics  . Smoking status: Never Smoker  . Smokeless tobacco: Never Used  . Alcohol use No     Allergies   Patient has no known allergies.   Review of Systems Review of Systems  Constitutional: Negative for chills and fever.  HENT: Negative for congestion.   Eyes: Negative for visual disturbance.  Respiratory: Negative for cough and shortness of breath.   Cardiovascular: Negative for chest pain.  Gastrointestinal: Positive for abdominal pain, diarrhea, nausea and vomiting. Negative for blood in stool.  Genitourinary: Negative for dysuria, flank pain, frequency, hematuria, urgency, vaginal bleeding and vaginal discharge.  Musculoskeletal: Negative for arthralgias and myalgias.  Skin: Negative for rash.  Neurological: Negative for dizziness, syncope, weakness, light-headedness, numbness and headaches.  Psychiatric/Behavioral: Negative  for sleep disturbance. The patient is not nervous/anxious.      Physical Exam Updated Vital Signs BP 134/90 (BP Location: Right Arm)   Pulse 91   Temp 98 F (36.7 C) (Oral)   Resp 18   Ht 5\' 7"  (1.702 m)   Wt 87.5 kg (193 lb)   LMP 09/24/2016 (Exact Date)   SpO2 95%   BMI 30.23  kg/m   Physical Exam  Constitutional: She is oriented to person, place, and time. She appears well-developed and well-nourished.  Non-toxic appearance. No distress.  HENT:  Head: Normocephalic and atraumatic.  Nose: Nose normal.  Mouth/Throat: Oropharynx is clear and moist.  Eyes: Pupils are equal, round, and reactive to light. Conjunctivae are normal. Right eye exhibits no discharge. Left eye exhibits no discharge.  Neck: Normal range of motion. Neck supple.  Cardiovascular: Normal rate, regular rhythm, normal heart sounds and intact distal pulses.  Exam reveals no gallop and no friction rub.   No murmur heard. Pulmonary/Chest: Effort normal and breath sounds normal. No respiratory distress. She has no wheezes. She has no rales. She exhibits no tenderness.  Abdominal: Soft. Bowel sounds are normal. There is generalized tenderness and tenderness in the epigastric area and left upper quadrant. There is no rigidity, no rebound, no guarding, no CVA tenderness, no tenderness at McBurney's point and negative Murphy's sign.  Musculoskeletal: Normal range of motion. She exhibits no tenderness.  Lymphadenopathy:    She has no cervical adenopathy.  Neurological: She is alert and oriented to person, place, and time.  Skin: Skin is warm and dry. Capillary refill takes less than 2 seconds.  Psychiatric: Her behavior is normal. Judgment and thought content normal.  Nursing note and vitals reviewed.    ED Treatments / Results  Labs (all labs ordered are listed, but only abnormal results are displayed) Labs Reviewed  COMPREHENSIVE METABOLIC PANEL - Abnormal; Notable for the following:       Result Value   Glucose, Bld 145 (*)    ALT 10 (*)    All other components within normal limits  CBC - Abnormal; Notable for the following:    RBC 5.63 (*)    Hemoglobin 11.9 (*)    MCV 70.0 (*)    MCH 21.1 (*)    All other components within normal limits  URINALYSIS, ROUTINE W REFLEX MICROSCOPIC -  Abnormal; Notable for the following:    APPearance HAZY (*)    All other components within normal limits  LIPASE, BLOOD  I-STAT BETA HCG BLOOD, ED ([facility], [facility], [facility] ONLY)    EKG  EKG Interpretation None       Radiology Ct Abdomen Pelvis W Contrast  Result Date: 09/25/2016 CLINICAL DATA:  Acute lower abdominal pain. EXAM: CT ABDOMEN AND PELVIS WITH CONTRAST TECHNIQUE: Multidetector CT imaging of the abdomen and pelvis was performed using the standard protocol following bolus administration of intravenous contrast. CONTRAST:  ISOVUE-300 IOPAMIDOL (ISOVUE-300) INJECTION 61% COMPARISON:  CT scan of April 25, 2012. FINDINGS: Lower chest: No acute abnormality. Hepatobiliary: No focal liver abnormality is seen. No gallstones, gallbladder wall thickening, or biliary dilatation. Pancreas: Unremarkable. No pancreatic ductal dilatation or surrounding inflammatory changes. Spleen: Normal in size without focal abnormality. Adrenals/Urinary Tract: Adrenal glands are unremarkable. Kidneys are normal, without renal calculi, focal lesion, or hydronephrosis. Bladder is unremarkable. Stomach/Bowel: Stomach is within normal limits. Appendix appears normal. No evidence of bowel wall thickening, distention, or inflammatory changes. Vascular/Lymphatic: No significant vascular findings are present. No enlarged abdominal  or pelvic lymph nodes. Reproductive: Uterus and bilateral adnexa are unremarkable. Other: No abdominal wall hernia or abnormality. No abdominopelvic ascites. Musculoskeletal: No acute or significant osseous findings. IMPRESSION: No abnormality seen in the abdomen or pelvis. Electronically Signed   By: Lupita Raider, M.D.   On: 09/25/2016 12:33    Procedures Procedures (including critical care time)  Medications Ordered in ED Medications  ketorolac (TORADOL) 30 MG/ML injection 15 mg (not administered)  dicyclomine (BENTYL) injection 20 mg (not administered)  gi cocktail  (Maalox,Lidocaine,Donnatal) (30 mLs Oral Given 09/25/16 1108)  metoCLOPramide (REGLAN) injection 10 mg (10 mg Intravenous Given 09/25/16 1121)  iopamidol (ISOVUE-300) 61 % injection (100 mLs  Contrast Given 09/25/16 1209)     Initial Impression / Assessment and Plan / ED Course  I have reviewed the triage vital signs and the nursing notes.  Pertinent labs & imaging results that were available during my care of the patient were reviewed by me and considered in my medical decision making (see chart for details).     Patient resents to the ED with complaints of generalized abdominal pain, nausea, emesis, diarrhea that has been ongoing for the past week. Patient's symptoms are intermittent.  Patient is nontoxic, nonseptic appearing, in no apparent distress.  Patient's pain and other symptoms adequately managed in emergency department.  Fluid bolus given.  Labs, imaging and vitals reviewed.  CT scan obtained that showed no acute abnormalities. Patient has no focal abdominal tenderness. Given the associated symptoms of nausea, vomiting, diarrhea for the patient's symptoms are more likely gastritis versus enteritis. Patient able tolerate by mouth fluids like emesis. Patient does not meet the SIRS or Sepsis criteria.  On repeat exam patient does not have a surgical abdomin and there are no peritoneal signs.  No indication of appendicitis, bowel obstruction, bowel perforation, cholecystitis, diverticulitis, PID, ovarian torsion or ectopic pregnancy. Patient feels much improved after medication in the ED. Patient discharged home with symptomatic treatment and given strict instructions for follow-up with their primary care physician.  I have also discussed reasons to return immediately to the ER and if pt is not improved in 2-3 then return to the ED.  Patient expresses understanding and agrees with plan. Pt requesting work noted.      Final Clinical Impressions(s) / ED Diagnoses   Final diagnoses:    Generalized abdominal pain  Nausea vomiting and diarrhea    New Prescriptions New Prescriptions   DICYCLOMINE (BENTYL) 20 MG TABLET    Take 1 tablet (20 mg total) by mouth 2 (two) times daily.   OMEPRAZOLE (PRILOSEC) 20 MG CAPSULE    Take 1 capsule (20 mg total) by mouth daily.   ONDANSETRON (ZOFRAN-ODT) 4 MG DISINTEGRATING TABLET    Take 1 tablet (4 mg total) by mouth every 8 (eight) hours as needed for nausea.     Rise Mu, PA-C 09/25/16 1301    Mesner, Barbara Cower, MD 09/26/16 [phone]

## 2016-09-25 NOTE — ED Triage Notes (Signed)
Pt to ER for evaluation of RLQ abdominal pain that has progressively gotten worse since last Sunday. States nausea, vomiting and diarrhea all began after the pain. Rating pain 10/10, denies urinary symptoms, denies vaginal discharge. States ectopic pregnancy 2 years ago with surgical intervention. LMP last week. A/o x4. States still has appendix.

## 2016-09-25 NOTE — ED Notes (Signed)
Placed patient into a gown on the monitor patient is resting with call bell in reach and family is at bedside

## 2017-10-11 DIAGNOSIS — H5210 Myopia, unspecified eye: Secondary | ICD-10-CM | POA: Diagnosis not present

## 2017-10-11 DIAGNOSIS — Z0100 Encounter for examination of eyes and vision without abnormal findings: Secondary | ICD-10-CM | POA: Diagnosis not present

## 2017-12-04 DIAGNOSIS — J029 Acute pharyngitis, unspecified: Secondary | ICD-10-CM | POA: Diagnosis not present

## 2017-12-05 ENCOUNTER — Emergency Department (HOSPITAL_COMMUNITY)
Admission: EM | Admit: 2017-12-05 | Discharge: 2017-12-05 | Disposition: A | Discharge status: Home / Self Care | Payer: 59 | Attending: Emergency Medicine | Admitting: Emergency Medicine

## 2017-12-05 DIAGNOSIS — Z79899 Other long term (current) drug therapy: Secondary | ICD-10-CM | POA: Diagnosis not present

## 2017-12-05 DIAGNOSIS — J029 Acute pharyngitis, unspecified: Secondary | ICD-10-CM | POA: Diagnosis not present

## 2017-12-05 DIAGNOSIS — J028 Acute pharyngitis due to other specified organisms: Secondary | ICD-10-CM | POA: Diagnosis not present

## 2017-12-05 MED ORDER — CHLORHEXIDINE GLUCONATE 0.12 % MT SOLN: 15.0000 mL | Freq: Two times a day (BID) | OROMUCOSAL | 0 refills | Status: DC

## 2017-12-05 MED ORDER — PREDNISONE 20 MG PO TABS: ORAL_TABLET | ORAL | 0 refills | Status: DC

## 2017-12-05 MED ORDER — IBUPROFEN 600 MG PO TABS: 600.0000 mg | ORAL_TABLET | Freq: Four times a day (QID) | ORAL | 0 refills | Status: DC | PRN

## 2017-12-05 NOTE — ED Provider Notes (Signed)
[facility] EMERGENCY DEPARTMENT Provider Note   CSN: [id number] Arrival date & time: 12/05/17  1300     History   Chief Complaint No chief complaint on file.   HPI [name] is a 37 y.o. female.  The history is provided by the patient. No language interpreter was used.     37 year old female presenting complaint sore throat.  Patient report for the past 2 days she has had subjective fever, chills, mild congestion, throat irritation, trouble swallowing, occasional nonproductive cough.  She denies any associated chest pains, shortness of breath, productive cough, abdominal cramping, nausea vomiting or diarrhea or rash.  She went to urgent care today and states that they did a strep test and was told her that there is nothing wrong, patient did not receive any symptomatic treatment and subsequently discharged.  She is here for a second opinion.  Past Medical History:  Diagnosis Date  . Anemia   . Kidney stone   . Migraine     There are no active problems to display for this patient.   Past Surgical History:  Procedure Laterality Date  . CESAREAN SECTION    . TUBAL LIGATION Left June 2012  . WISDOM TOOTH EXTRACTION       OB History    Gravida  6   Para  3   Term  3   Preterm      AB  3   Living  3     SAB  1   TAB  1   Ectopic  1   Multiple      Live Births               Home Medications    Prior to Admission medications   Medication Sig Start Date End Date Taking? Authorizing Provider  dicyclomine (BENTYL) 20 MG tablet Take 1 tablet (20 mg total) by mouth 2 (two) times daily. 09/25/16   Rise Mu, PA-C  diphenhydramine-acetaminophen (TYLENOL PM) 25-500 MG TABS Take 2 tablets by mouth 2 (two) times daily as needed (headaches/pain).    [provider]  HYDROcodone-acetaminophen (NORCO/VICODIN) 5-325 MG per tablet Take 1-2 tablets by mouth every 6 (six) hours as needed. Patient not taking: Reported on  09/25/2016 06/15/13   Roxy Horseman, PA-C  omeprazole (PRILOSEC) 20 MG capsule Take 1 capsule (20 mg total) by mouth daily. 09/25/16   Demetrios Loll T, PA-C  ondansetron (ZOFRAN-ODT) 4 MG disintegrating tablet Take 1 tablet (4 mg total) by mouth every 8 (eight) hours as needed for nausea. 09/25/16   Rise Mu, PA-C    Family History Family History  Problem Relation Age of Onset  . Diabetes Mother   . Hypertension Mother   . Diabetes Maternal Grandmother   . Hypertension Maternal Grandmother   . Cancer Maternal Grandmother   . Diabetes Maternal Grandfather   . Hypertension Maternal Grandfather     Social History Social History   Tobacco Use  . Smoking status: Never Smoker  . Smokeless tobacco: Never Used  Substance Use Topics  . Alcohol use: No  . Drug use: No     Allergies   Patient has no known allergies.   Review of Systems Review of Systems  All other systems reviewed and are negative.    Physical Exam Updated Vital Signs There were no vitals taken for this visit.  Physical Exam  Constitutional: She appears well-developed and well-nourished. No distress.  HENT:  Head: Atraumatic.  Ears: Normal  TMs bilaterally Nose: Normal nares Throat: Uvula midline bilateral tonsillar enlargement with exudates, posterior oropharyngeal erythema noted.  No trismus  Eyes: Conjunctivae are normal.  Neck: Neck supple. No JVD present. No thyromegaly present.  Cardiovascular: Normal rate and regular rhythm.  Pulmonary/Chest: Effort normal and breath sounds normal.  Abdominal: Soft. She exhibits no distension. There is no tenderness.  Lymphadenopathy:    She has cervical adenopathy.  Neurological: She is alert.  Skin: No rash noted.  Psychiatric: She has a normal mood and affect.  Nursing note and vitals reviewed.    ED Treatments / Results  Labs (all labs ordered are listed, but only abnormal results are displayed) Labs Reviewed - No data to  display  EKG None  Radiology No results found.  Procedures Procedures (including critical care time)  Medications Ordered in ED Medications - No data to display   Initial Impression / Assessment and Plan / ED Course  I have reviewed the triage vital signs and the nursing notes.  Pertinent labs & imaging results that were available during my care of the patient were reviewed by me and considered in my medical decision making (see chart for details).     BP (!) 154/96 (BP Location: Right Arm)   Pulse 99   Temp 99.5 F (37.5 C) (Oral)   Resp 17   SpO2 100%    Final Clinical Impressions(s) / ED Diagnoses   Final diagnoses:  Viral pharyngitis    ED Discharge Orders         Ordered    ibuprofen (ADVIL,MOTRIN) 600 MG tablet  Every 6 hours PRN     12/05/17 1311    chlorhexidine (PERIDEX) 0.12 % solution  2 times daily     12/05/17 1311    predniSONE (DELTASONE) 20 MG tablet     12/05/17 1311         1:10 PM Patient here for symptoms suggestive of viral pharyngitis.  She reportedly had a negative strep screen at the urgent care today.  She has normal phonation, no airway compromise, and her lungs are clear on examination.  Plan to provide symptomatic treatment and outpatient follow-up.  Return precautions discussed.   Fayrene Helper, PA-C 12/05/17 1312    Long, Arlyss Repress, MD 12/05/17 [phone]

## 2018-02-14 ENCOUNTER — Other Ambulatory Visit: Payer: Self-pay

## 2018-02-14 ENCOUNTER — Emergency Department (HOSPITAL_COMMUNITY): Payer: 59

## 2018-02-14 ENCOUNTER — Emergency Department (HOSPITAL_COMMUNITY)
Admission: EM | Admit: 2018-02-14 | Discharge: 2018-02-14 | Disposition: A | Discharge status: Home / Self Care | Payer: 59 | Attending: Emergency Medicine | Admitting: Emergency Medicine

## 2018-02-14 ENCOUNTER — Encounter (HOSPITAL_COMMUNITY): Payer: Self-pay

## 2018-02-14 DIAGNOSIS — Z79899 Other long term (current) drug therapy: Secondary | ICD-10-CM | POA: Diagnosis not present

## 2018-02-14 DIAGNOSIS — R0602 Shortness of breath: Secondary | ICD-10-CM | POA: Diagnosis not present

## 2018-02-14 DIAGNOSIS — J020 Streptococcal pharyngitis: Secondary | ICD-10-CM | POA: Diagnosis not present

## 2018-02-14 DIAGNOSIS — R0789 Other chest pain: Secondary | ICD-10-CM | POA: Diagnosis not present

## 2018-02-14 DIAGNOSIS — R070 Pain in throat: Secondary | ICD-10-CM | POA: Diagnosis present

## 2018-02-14 DIAGNOSIS — I959 Hypotension, unspecified: Secondary | ICD-10-CM | POA: Diagnosis not present

## 2018-02-14 DIAGNOSIS — R079 Chest pain, unspecified: Secondary | ICD-10-CM | POA: Diagnosis not present

## 2018-02-14 DIAGNOSIS — R05 Cough: Secondary | ICD-10-CM | POA: Diagnosis not present

## 2018-02-14 DIAGNOSIS — R069 Unspecified abnormalities of breathing: Secondary | ICD-10-CM | POA: Diagnosis not present

## 2018-02-14 LAB — GROUP A STREP BY PCR: Group A Strep by PCR: DETECTED — AB

## 2018-02-14 LAB — INFLUENZA PANEL BY PCR (TYPE A & B)
Influenza A By PCR: NEGATIVE
Influenza B By PCR: NEGATIVE

## 2018-02-14 MED ORDER — BENZONATATE 100 MG PO CAPS: 200.0000 mg | ORAL_CAPSULE | Freq: Three times a day (TID) | ORAL | 0 refills | Status: DC

## 2018-02-14 MED ORDER — LIDOCAINE VISCOUS HCL 2 % MT SOLN: 15.0000 mL | OROMUCOSAL | 0 refills | Status: DC | PRN

## 2018-02-14 MED ORDER — ACETAMINOPHEN 325 MG PO TABS
650.0000 mg | ORAL_TABLET | Freq: Once | ORAL | Status: AC
  Administered 2018-02-14: 650 mg via ORAL
  Filled 2018-02-14: qty 2

## 2018-02-14 MED ORDER — PENICILLIN G BENZATHINE 1200000 UNIT/2ML IM SUSP
1.2000 10*6.[IU] | Freq: Once | INTRAMUSCULAR | Status: AC
Start: 2018-02-14 — End: 2018-02-14
  Administered 2018-02-14: 1.2 10*6.[IU] via INTRAMUSCULAR
  Filled 2018-02-14: qty 2

## 2018-02-14 MED ORDER — PENICILLIN G BENZATHINE & PROC 1200000 UNIT/2ML IM SUSP: 1.2000 10*6.[IU] | Freq: Once | INTRAMUSCULAR | Status: DC

## 2018-02-14 MED ORDER — ONDANSETRON 4 MG PO TBDP
4.0000 mg | ORAL_TABLET | Freq: Once | ORAL | Status: AC
  Administered 2018-02-14: 4 mg via ORAL
  Filled 2018-02-14: qty 1

## 2018-02-14 NOTE — ED Provider Notes (Signed)
[facility] EMERGENCY DEPARTMENT Provider Note   CSN: [id number] Arrival date & time: 02/14/18  1247     History   Chief Complaint Chief Complaint  Patient presents with  . Influenza  . Generalized Body Aches    HPI [name] is a 38 y.o. female with a past medical history of migraines who presents to ED for evaluation of influenza-like illness since last night.  States that for the past several days she has had a decreased appetite.  However, she began having generalized body aches, headache, sinus pain and pressure, chills, shortness of breath, chest pain, nausea.  She also reports sore throat, worse with swallowing.  Sick contacts at work with similar symptoms.  She took 1 ibuprofen about 4 hours prior to arrival.  She did not receive her influenza vaccine this year.  She denies any recent immobilization, pleuritic chest pain, prior MI, DVT, PE, recent travel.  HPI  Past Medical History:  Diagnosis Date  . Anemia   . Kidney stone   . Migraine     There are no active problems to display for this patient.   Past Surgical History:  Procedure Laterality Date  . CESAREAN SECTION    . TUBAL LIGATION Left June 2012  . WISDOM TOOTH EXTRACTION       OB History    Gravida  6   Para  3   Term  3   Preterm      AB  3   Living  3     SAB  1   TAB  1   Ectopic  1   Multiple      Live Births               Home Medications    Prior to Admission medications   Medication Sig Start Date End Date Taking? Authorizing Provider  benzonatate (TESSALON) 100 MG capsule Take 2 capsules (200 mg total) by mouth every 8 (eight) hours. 02/14/18   Levonte Molina, PA-C  chlorhexidine (PERIDEX) 0.12 % solution Use as directed 15 mLs in the mouth or throat 2 (two) times daily. 12/05/17   Fayrene Helper, PA-C  dicyclomine (BENTYL) 20 MG tablet Take 1 tablet (20 mg total) by mouth 2 (two) times daily. 09/25/16   Rise Mu, PA-C    diphenhydramine-acetaminophen (TYLENOL PM) 25-500 MG TABS Take 2 tablets by mouth 2 (two) times daily as needed (headaches/pain).    [provider]  ibuprofen (ADVIL,MOTRIN) 600 MG tablet Take 1 tablet (600 mg total) by mouth every 6 (six) hours as needed. 12/05/17   Fayrene Helper, PA-C  lidocaine (XYLOCAINE) 2 % solution Use as directed 15 mLs in the mouth or throat as needed for mouth pain. 02/14/18   Ryder Chesmore, PA-C  omeprazole (PRILOSEC) 20 MG capsule Take 1 capsule (20 mg total) by mouth daily. 09/25/16   Demetrios Loll T, PA-C  ondansetron (ZOFRAN-ODT) 4 MG disintegrating tablet Take 1 tablet (4 mg total) by mouth every 8 (eight) hours as needed for nausea. 09/25/16   Rise Mu, PA-C  predniSONE (DELTASONE) 20 MG tablet 2 tabs po daily x 4 days 12/05/17   Fayrene Helper, PA-C    Family History Family History  Problem Relation Age of Onset  . Diabetes Mother   . Hypertension Mother   . Diabetes Maternal Grandmother   . Hypertension Maternal Grandmother   . Cancer Maternal Grandmother   . Diabetes Maternal Grandfather   . Hypertension Maternal  Grandfather     Social History Social History   Tobacco Use  . Smoking status: Never Smoker  . Smokeless tobacco: Never Used  Substance Use Topics  . Alcohol use: No  . Drug use: No     Allergies   Patient has no known allergies.   Review of Systems Review of Systems  Constitutional: Positive for appetite change. Negative for chills and fever.  HENT: Positive for congestion, sinus pressure, sinus pain and sore throat. Negative for ear pain, rhinorrhea and sneezing.   Eyes: Negative for photophobia and visual disturbance.  Respiratory: Positive for cough and shortness of breath. Negative for chest tightness and wheezing.   Cardiovascular: Positive for chest pain. Negative for palpitations.  Gastrointestinal: Negative for abdominal pain, blood in stool, constipation, diarrhea, nausea and vomiting.  Genitourinary:  Negative for dysuria, hematuria and urgency.  Musculoskeletal: Positive for myalgias.  Skin: Negative for rash.  Neurological: Negative for dizziness, weakness and light-headedness.     Physical Exam Updated Vital Signs BP 105/74   Pulse 78   Temp 97.9 F (36.6 C) (Oral)   Resp 20   Ht 5\' 7"  (1.702 m)   Wt 96.6 kg   LMP 02/07/2018   SpO2 99%   BMI 33.36 kg/m   Physical Exam Vitals signs and nursing note reviewed.  Constitutional:      General: She is not in acute distress.    Appearance: She is well-developed.  HENT:     Head: Normocephalic and atraumatic.     Right Ear: A middle ear effusion is present.     Left Ear: A middle ear effusion is present.     Nose: Nose normal.     Mouth/Throat:     Pharynx: Oropharynx is clear. Uvula midline. Posterior oropharyngeal erythema present.     Tonsils: Swelling: 1+ on the right. 1+ on the left.     Comments: Patient does not appear to be in acute distress. No trismus or drooling present. No pooling of secretions. Patient is tolerating secretions and is not in respiratory distress. No neck pain or tenderness to palpation of the neck. Full active and passive range of motion of the neck. No evidence of RPA or PTA. Eyes:     General: No scleral icterus.       Left eye: No discharge.     Conjunctiva/sclera: Conjunctivae normal.  Neck:     Musculoskeletal: Normal range of motion and neck supple.  Cardiovascular:     Rate and Rhythm: Normal rate and regular rhythm.     Heart sounds: Normal heart sounds. No murmur. No friction rub. No gallop.   Pulmonary:     Effort: Pulmonary effort is normal. No respiratory distress.     Breath sounds: Normal breath sounds.  Chest:     Chest wall: Tenderness present.       Comments: Pain reproducible to palpation. Abdominal:     General: Bowel sounds are normal. There is no distension.     Palpations: Abdomen is soft.     Tenderness: There is no abdominal tenderness. There is no guarding.    Musculoskeletal: Normal range of motion.     Comments: No lower extremity edema, erythema or calf tenderness bilaterally.  Skin:    General: Skin is warm and dry.     Findings: No rash.  Neurological:     Mental Status: She is alert.     Motor: No abnormal muscle tone.     Coordination: Coordination normal.  ED Treatments / Results  Labs (all labs ordered are listed, but only abnormal results are displayed) Labs Reviewed  GROUP A STREP BY PCR - Abnormal; Notable for the following components:      Result Value   Group A Strep by PCR DETECTED (*)    All other components within normal limits  INFLUENZA PANEL BY PCR (TYPE A & B)    EKG EKG Interpretation  Date/Time:  Thursday February 14 2018 13:24:07 EST Ventricular Rate:  74 PR Interval:    QRS Duration: 75 QT Interval:  366 QTC Calculation: 406 R Axis:   68 Text Interpretation:  Sinus rhythm Anteroseptal infarct, old Confirmed by Jacalyn LefevreHaviland, Julie [id number]) on 02/14/2018 2:23:53 PM   Radiology Dg Chest 2 View  Result Date: 02/14/2018 CLINICAL DATA:  Cough and chest pain EXAM: CHEST - 2 VIEW COMPARISON:  January 26, 2013 FINDINGS: Lungs are clear. Heart is upper normal in size with pulmonary vascularity normal. No adenopathy. No pneumothorax. No bone lesions. IMPRESSION: No edema consolidation.  Heart is upper normal in size. Electronically Signed   By: Bretta BangWilliam  Woodruff III M.D.   On: 02/14/2018 14:28    Procedures Procedures (including critical care time)  Medications Ordered in ED Medications  penicillin g procaine-penicillin g benzathine (BICILLIN-CR) injection 600000-600000 units (has no administration in time range)  acetaminophen (TYLENOL) tablet 650 mg (650 mg Oral Given 02/14/18 1337)  ondansetron (ZOFRAN-ODT) disintegrating tablet 4 mg (4 mg Oral Given 02/14/18 1337)     Initial Impression / Assessment and Plan / ED Course  I have reviewed the triage vital signs and the nursing notes.  Pertinent labs &  imaging results that were available during my care of the patient were reviewed by me and considered in my medical decision making (see chart for details).  Clinical Course as of Feb 15 1432  Thu Feb 14, 2018  1326 Patient declines IV.   [HK]    Clinical Course User Index [HK] Feliza Diven, PA-C    Pt rapid strep test positive. Pt is tolerating secretions, not in respiratory distress, no neck pain, no trismus. Presentation not concerning for peritonsillar abscess or spread of infection to deep spaces of the throat; patent airway.  She also endorses influenza-like symptoms including shortness of breath, cough, sinus pain and pressure.  Chest pain is reproducible to palpation.  Chest x-ray is unremarkable.  EKG shows normal sinus rhythm.  Flu swab is negative.  Pt will be discharged with Tessalon Perles and lidocaine to swish and spit.  She elects to have one-time dose of IM penicillin as treatment. Ibuprofen or Tylenol as needed for pain/fever. Specific return precautions discussed. Recommended PCP follow up. Pt appears safe for discharge.   Patient is hemodynamically stable, in NAD, and able to ambulate in the ED. Evaluation does not show pathology that would require ongoing emergent intervention or inpatient treatment. I explained the diagnosis to the patient. Pain has been managed and has no complaints prior to discharge. Patient is comfortable with above plan and is stable for discharge at this time. All questions were answered prior to disposition. Strict return precautions for returning to the ED were discussed. Encouraged follow up with PCP.    Portions of this note were generated with Scientist, clinical (histocompatibility and immunogenetics)Dragon dictation software. Dictation errors may occur despite best attempts at proofreading.   Final Clinical Impressions(s) / ED Diagnoses   Final diagnoses:  Strep pharyngitis    ED Discharge Orders         Ordered  lidocaine (XYLOCAINE) 2 % solution  As needed     02/14/18 1433    benzonatate  (TESSALON) 100 MG capsule  Every 8 hours     02/14/18 1433           Dietrich PatesKhatri, Anjelina Dung, PA-C 02/14/18 1436    Jacalyn LefevreHaviland, Julie, MD 02/14/18 1610

## 2018-02-14 NOTE — ED Triage Notes (Addendum)
Pt from work via [employer] c/o sudden onset generalized body aches, nausea, malaise, feeling flushed (afebrile for EMS), SOB HA, CP, loss of appetite, chills and skakes.  Throat also hurts when she swallows. Patient swabbed for both flu and strep pending provider orders.

## 2018-02-14 NOTE — Discharge Instructions (Addendum)
°  Your strep test was positive. °Wash your hands frequently and thoroughly, especially before eating. °Avoid sharing cups, eating utensils and toothbrushes °Limit close contact with people who have symptoms of strep throat °Treatment: Antibiotics are used to kill strep bacteria. This helps prevent spreading the bacteria to others and it also helps to prevent rare, but serious complications such as rheumatic fever or kidney inflammation.  °Be sure to finish all antibiotic medication even if you feel better.  °Rinse your mouth with salt water mixture 3-4 times a day (1/2 teaspoon of salt in 1 cup warm water). This can help to reduce the swelling that causes your throat pain °Rest °Don’t smoke  °Stay home from school or work until you have been taking antibiotics for 24 hours °Drink enough water to keep your pee (urine) clear or pale yellow and eat soothing foods to avoid throat irritation °Change your toothbrush after being on antibiotics for 24 hours.  °Return if: °Your neck keeps getting bigger or red and tender °You get a rash, cough or earache °You cough up thick liquid that is green, yellow-brown or bloody °You have pain that does not get better with medicine  °Your problems get worse instead of better °You have a continued fever °Your joints are red or they hurt    °

## 2018-08-20 DIAGNOSIS — Z0100 Encounter for examination of eyes and vision without abnormal findings: Secondary | ICD-10-CM | POA: Diagnosis not present

## 2018-12-10 ENCOUNTER — Other Ambulatory Visit: Payer: Self-pay

## 2018-12-10 ENCOUNTER — Ambulatory Visit
Admission: EM | Admit: 2018-12-10 | Discharge: 2018-12-10 | Disposition: A | Discharge status: Home / Self Care | Payer: 59 | Attending: Physician Assistant | Admitting: Physician Assistant

## 2018-12-10 DIAGNOSIS — N898 Other specified noninflammatory disorders of vagina: Secondary | ICD-10-CM | POA: Diagnosis not present

## 2018-12-10 DIAGNOSIS — R69 Illness, unspecified: Secondary | ICD-10-CM | POA: Diagnosis not present

## 2018-12-10 DIAGNOSIS — Z202 Contact with and (suspected) exposure to infections with a predominantly sexual mode of transmission: Secondary | ICD-10-CM | POA: Diagnosis not present

## 2018-12-10 LAB — POCT URINALYSIS DIP (MANUAL ENTRY)
Bilirubin, UA: NEGATIVE
Blood, UA: NEGATIVE
Glucose, UA: NEGATIVE mg/dL
Nitrite, UA: NEGATIVE
Protein Ur, POC: NEGATIVE mg/dL
Spec Grav, UA: >=1.03 — AB (ref 1.010–1.025)
Urobilinogen, UA: 0.2 E.U./dL
pH, UA: 5.5 (ref 5.0–8.0)

## 2018-12-10 MED ORDER — METRONIDAZOLE 500 MG PO TABS: 500.0000 mg | ORAL_TABLET | Freq: Two times a day (BID) | ORAL | 0 refills | Status: DC

## 2018-12-10 MED ORDER — FLUCONAZOLE 150 MG PO TABS: 150.0000 mg | ORAL_TABLET | Freq: Every day | ORAL | 0 refills | Status: DC

## 2018-12-10 MED ORDER — AZITHROMYCIN 500 MG PO TABS
1000.0000 mg | ORAL_TABLET | Freq: Once | ORAL | Status: AC
  Administered 2018-12-10: 1000 mg via ORAL

## 2018-12-10 NOTE — Discharge Instructions (Addendum)
You were treated empirically for chlamydia. Azithromycin 1g by mouth given in office today. Start diflucan as directed. Cytology sent, you will be contacted with any positive results that requires further treatment. Refrain from sexual activity and alcohol use for the next 7 days. Monitor for any worsening of symptoms, fever, abdominal pain, nausea, vomiting, to follow up for reevaluation.

## 2018-12-10 NOTE — ED Triage Notes (Signed)
Pt states her boyfriend tested positive for clymidia yesterday

## 2018-12-10 NOTE — ED Provider Notes (Signed)
[facility]    CSN: [id number] Arrival date & time: 12/10/18  1757      History   Chief Complaint Chief Complaint  Patient presents with  . Exposure to STD    HPI [name] is a 38 y.o. female.   38 year old female comes in for STD testing.  States partner was tested positive for chlamydia.  She has had 1 week history of mid abdominal pain that is intermittent, worse with movement.  Denies nausea or vomiting.  Denies diarrhea or constipation.  Denies fever, chills, body aches.  Denies frequency, dysuria, hematuria.  However, has felt "tingle" sensation during urination.  Has had vaginal discharge without itching, bleeding, pain.  Sexually active with one female partner, no condom use.  LMP 11/21/2018.  History of tubal ligation.     Past Medical History:  Diagnosis Date  . Anemia   . Kidney stone   . Migraine     There are no active problems to display for this patient.   Past Surgical History:  Procedure Laterality Date  . CESAREAN SECTION    . TUBAL LIGATION Left June 2012  . WISDOM TOOTH EXTRACTION      OB History    Gravida  6   Para  3   Term  3   Preterm      AB  3   Living  3     SAB  1   TAB  1   Ectopic  1   Multiple      Live Births               Home Medications    Prior to Admission medications   Medication Sig Start Date End Date Taking? Authorizing Provider  fluconazole (DIFLUCAN) 150 MG tablet Take 1 tablet (150 mg total) by mouth daily. Take second dose 72 hours later if symptoms still persists. 12/10/18   Ok Edwards, PA-C    Family History Family History  Problem Relation Age of Onset  . Diabetes Mother   . Hypertension Mother   . Diabetes Maternal Grandmother   . Hypertension Maternal Grandmother   . Cancer Maternal Grandmother   . Diabetes Maternal Grandfather   . Hypertension Maternal Grandfather     Social History Social History   Tobacco Use  . Smoking status: Never Smoker  .  Smokeless tobacco: Never Used  Substance Use Topics  . Alcohol use: No  . Drug use: No     Allergies   Patient has no known allergies.   Review of Systems Review of Systems  Reason unable to perform ROS: See HPI as above.     Physical Exam Triage Vital Signs ED Triage Vitals  Enc Vitals Group     BP 12/10/18 1809 133/78     Pulse Rate 12/10/18 1809 85     Resp 12/10/18 1809 20     Temp 12/10/18 1809 98.3 F (36.8 C)     Temp Source 12/10/18 1809 Oral     SpO2 12/10/18 1809 98 %     Weight --      Height --      Head Circumference --      Peak Flow --      Pain Score 12/10/18 1810 0     Pain Loc --      Pain Edu? --      Excl. in [place]? --    No data found.  Updated Vital Signs BP 133/78 (BP  Location: Left Arm)   Pulse 85   Temp 98.3 F (36.8 C) (Oral)   Resp 20   LMP 11/21/2018   SpO2 98%   Physical Exam Exam conducted with a chaperone present.  Constitutional:      General: She is not in acute distress.    Appearance: She is well-developed. She is not ill-appearing, toxic-appearing or diaphoretic.  HENT:     Head: Normocephalic and atraumatic.  Eyes:     Conjunctiva/sclera: Conjunctivae normal.     Pupils: Pupils are equal, round, and reactive to light.  Cardiovascular:     Rate and Rhythm: Normal rate and regular rhythm.     Heart sounds: Normal heart sounds. No murmur. No friction rub. No gallop.   Pulmonary:     Effort: Pulmonary effort is normal.     Breath sounds: Normal breath sounds. No wheezing or rales.  Abdominal:     General: Bowel sounds are normal.     Palpations: Abdomen is soft.     Tenderness: There is generalized abdominal tenderness. There is no right CVA tenderness, left CVA tenderness, guarding or rebound.  Genitourinary:    Labia:        Right: No rash or tenderness.        Left: No rash or tenderness.      Vagina: Vaginal discharge present.     Cervix: No cervical motion tenderness.     Uterus: Normal.      Adnexa: Right  adnexa normal and left adnexa normal.       Right: No tenderness or fullness.         Left: No tenderness or fullness.    Skin:    General: Skin is warm and dry.  Neurological:     Mental Status: She is alert and oriented to person, place, and time.  Psychiatric:        Behavior: Behavior normal.        Judgment: Judgment normal.      UC Treatments / Results  Labs (all labs ordered are listed, but only abnormal results are displayed) Labs Reviewed  POCT URINALYSIS DIP (MANUAL ENTRY) - Abnormal; Notable for the following components:      Result Value   Ketones, POC UA small (15) (*)    Spec Grav, UA >=1.030 (*)    Leukocytes, UA Trace (*)    All other components within normal limits  URINE CULTURE  CERVICOVAGINAL ANCILLARY ONLY    EKG   Radiology No results found.  Procedures Procedures (including critical care time)  Medications Ordered in UC Medications  azithromycin (ZITHROMAX) tablet 1,000 mg (1,000 mg Oral Given 12/10/18 1911)    Initial Impression / Assessment and Plan / UC Course  I have reviewed the triage vital signs and the nursing notes.  Pertinent labs & imaging results that were available during my care of the patient were reviewed by me and considered in my medical decision making (see chart for details).    Trace leukocytes, no nitrates, will send for urine culture.  Patient without CMT, tender uterus, lower suspicion for PID causing abdominal pain.  At this time we will treat empirically for chlamydia with azithromycin.  Will also provide Diflucan to prevent yeast infection.  Cytology sent.  Patient to avoid sexual activity for the next 7 days.  Will have patient monitor abdominal pain.  Return precautions given.  Final Clinical Impressions(s) / UC Diagnoses   Final diagnoses:  Vaginal discharge  STD exposure  ED Prescriptions    Medication Sig Dispense Auth. Provider   metroNIDAZOLE (FLAGYL) 500 MG tablet  (Status: Discontinued) Take 1 tablet  (500 mg total) by mouth 2 (two) times daily. 14 tablet Yu, Amy V, PA-C   fluconazole (DIFLUCAN) 150 MG tablet Take 1 tablet (150 mg total) by mouth daily. Take second dose 72 hours later if symptoms still persists. 2 tablet Belinda Fisher, PA-C     PDMP not reviewed this encounter.   Belinda Fisher, PA-C 12/10/18 1948

## 2018-12-13 ENCOUNTER — Telehealth: Payer: Self-pay | Admitting: Emergency Medicine

## 2018-12-13 LAB — CERVICOVAGINAL ANCILLARY ONLY
Chlamydia: NEGATIVE
Neisseria Gonorrhea: NEGATIVE
Trichomonas: POSITIVE — AB

## 2018-12-13 LAB — URINE CULTURE: Culture: >=100000 — AB

## 2018-12-13 MED ORDER — METRONIDAZOLE 500 MG PO TABS: 2000.0000 mg | ORAL_TABLET | Freq: Once | ORAL | 0 refills | Status: AC

## 2018-12-13 MED ORDER — CEPHALEXIN 500 MG PO CAPS: 500.0000 mg | ORAL_CAPSULE | Freq: Two times a day (BID) | ORAL | 0 refills | Status: AC

## 2018-12-13 NOTE — Telephone Encounter (Signed)
Trichomonas is positive. Rx  for Flagyl 2 grams, once was sent to the pharmacy of record. Pt needs education to refrain from sexual intercourse for 7 days to give the medicine time to work. Sexual partners need to be notified and tested/treated. Condoms may reduce risk of reinfection. Recheck for further evaluation if symptoms are not improving.   Urine culture was positive for  E coli, pt was not placed on antibiotics. Per Claiborne Billings, send keflex bid x5 days  Patient contacted and made aware of    results. Pt verbalized understanding and had all questions answered.

## 2018-12-17 ENCOUNTER — Telehealth: Payer: Self-pay | Admitting: Internal Medicine

## 2018-12-17 NOTE — Telephone Encounter (Signed)
error 

## 2019-02-25 DIAGNOSIS — R1030 Lower abdominal pain, unspecified: Secondary | ICD-10-CM | POA: Diagnosis not present

## 2019-02-25 DIAGNOSIS — Z202 Contact with and (suspected) exposure to infections with a predominantly sexual mode of transmission: Secondary | ICD-10-CM | POA: Diagnosis not present

## 2019-02-25 DIAGNOSIS — R69 Illness, unspecified: Secondary | ICD-10-CM | POA: Diagnosis not present

## 2019-03-10 ENCOUNTER — Ambulatory Visit: Payer: 59 | Admitting: Family Medicine

## 2019-03-31 DIAGNOSIS — F419 Anxiety disorder, unspecified: Secondary | ICD-10-CM

## 2019-03-31 DIAGNOSIS — E559 Vitamin D deficiency, unspecified: Secondary | ICD-10-CM

## 2019-03-31 HISTORY — DX: Vitamin D deficiency, unspecified: E55.9

## 2019-03-31 HISTORY — DX: Anxiety disorder, unspecified: F41.9

## 2019-04-04 ENCOUNTER — Telehealth: Payer: Self-pay | Admitting: Family Medicine

## 2019-04-04 NOTE — Telephone Encounter (Signed)
Called pt and reminded of appointment on Monday

## 2019-04-07 ENCOUNTER — Encounter: Payer: Self-pay | Admitting: Family Medicine

## 2019-04-07 ENCOUNTER — Other Ambulatory Visit: Payer: Self-pay

## 2019-04-07 ENCOUNTER — Ambulatory Visit (INDEPENDENT_AMBULATORY_CARE_PROVIDER_SITE_OTHER): Payer: 59 | Admitting: Family Medicine

## 2019-04-07 VITALS — BP 115/76 | HR 90 | Temp 98.5°F | Ht 67.0 in | Wt 226.2 lb

## 2019-04-07 DIAGNOSIS — G4700 Insomnia, unspecified: Secondary | ICD-10-CM

## 2019-04-07 DIAGNOSIS — F419 Anxiety disorder, unspecified: Secondary | ICD-10-CM | POA: Diagnosis not present

## 2019-04-07 DIAGNOSIS — Z0000 Encounter for general adult medical examination without abnormal findings: Secondary | ICD-10-CM

## 2019-04-07 DIAGNOSIS — R519 Headache, unspecified: Secondary | ICD-10-CM

## 2019-04-07 DIAGNOSIS — R69 Illness, unspecified: Secondary | ICD-10-CM | POA: Diagnosis not present

## 2019-04-07 DIAGNOSIS — Z09 Encounter for follow-up examination after completed treatment for conditions other than malignant neoplasm: Secondary | ICD-10-CM

## 2019-04-07 DIAGNOSIS — Z7689 Persons encountering health services in other specified circumstances: Secondary | ICD-10-CM

## 2019-04-07 HISTORY — DX: Insomnia, unspecified: G47.00

## 2019-04-07 LAB — POCT URINALYSIS DIPSTICK
Bilirubin, UA: NEGATIVE
Blood, UA: NEGATIVE
Glucose, UA: NEGATIVE
Ketones, UA: NEGATIVE
Leukocytes, UA: NEGATIVE
Nitrite, UA: NEGATIVE
Protein, UA: NEGATIVE
Spec Grav, UA: >=1.03 — AB (ref 1.010–1.025)
Urobilinogen, UA: 0.2 E.U./dL
pH, UA: 6 (ref 5.0–8.0)

## 2019-04-07 LAB — POCT GLYCOSYLATED HEMOGLOBIN (HGB A1C): Hemoglobin A1C: 7.1 % — AB (ref 4.0–5.6)

## 2019-04-07 LAB — GLUCOSE, POCT (MANUAL RESULT ENTRY): POC Glucose: 115 mg/dl — AB (ref 70–99)

## 2019-04-07 MED ORDER — TRAZODONE HCL 50 MG PO TABS: 50.0000 mg | ORAL_TABLET | Freq: Every day | ORAL | 3 refills | Status: DC

## 2019-04-07 MED ORDER — BUSPIRONE HCL 5 MG PO TABS: 5.0000 mg | ORAL_TABLET | Freq: Two times a day (BID) | ORAL | 3 refills | Status: DC

## 2019-04-07 MED ORDER — TOPIRAMATE 25 MG PO TABS: 25.0000 mg | ORAL_TABLET | Freq: Two times a day (BID) | ORAL | 3 refills | Status: DC

## 2019-04-07 NOTE — Patient Instructions (Addendum)
Topiramate tablets What is this medicine? TOPIRAMATE (toe PYRE a mate) is used to treat seizures in adults or children with epilepsy. It is also used for the prevention of migraine headaches. This medicine may be used for other purposes; ask your health care provider or pharmacist if you have questions. COMMON BRAND NAME(S): Topamax, Topiragen What should I tell my health care provider before I take this medicine? They need to know if you have any of these conditions:  bleeding disorders  kidney disease  lung or breathing disease, like asthma  suicidal thoughts, plans, or attempt; a previous suicide attempt by you or a family member  an unusual or allergic reaction to topiramate, other medicines, foods, dyes, or preservatives  pregnant or trying to get pregnant  breast-feeding How should I use this medicine? Take this medicine by mouth with a glass of water. Follow the directions on the prescription label. Do not cut, crush or chew this medicine. Swallow the tablets whole. You can take it with or without food. If it upsets your stomach, take it with food. Take your medicine at regular intervals. Do not take it more often than directed. Do not stop taking except on your doctor's advice. A special MedGuide will be given to you by the pharmacist with each prescription and refill. Be sure to read this information carefully each time. Talk to your pediatrician regarding the use of this medicine in children. While this drug may be prescribed for children as young as 2 years of age for selected conditions, precautions do apply. Overdosage: If you think you have taken too much of this medicine contact a poison control center or emergency room at once. NOTE: This medicine is only for you. Do not share this medicine with others. What if I miss a dose? If you miss a dose, take it as soon as you can. If your next dose is to be taken in less than 6 hours, then do not take the missed dose. Take the  next dose at your regular time. Do not take double or extra doses. What may interact with this medicine? This medicine may interact with the following medications:  acetazolamide  alcohol  antihistamines for allergy, cough, and cold  aspirin and aspirin-like medicines  atropine  birth control pills  certain medicines for anxiety or sleep  certain medicines for bladder problems like oxybutynin, tolterodine  certain medicines for depression like amitriptyline, fluoxetine, sertraline  certain medicines for seizures like carbamazepine, phenobarbital, phenytoin, primidone, valproic acid, zonisamide  certain medicines for stomach problems like dicyclomine, hyoscyamine  certain medicines for travel sickness like scopolamine  certain medicines for Parkinson's disease like benztropine, trihexyphenidyl  certain medicines that treat or prevent blood clots like warfarin, enoxaparin, dalteparin, apixaban, dabigatran, and rivaroxaban  digoxin  general anesthetics like halothane, isoflurane, methoxyflurane, propofol  hydrochlorothiazide  ipratropium  lithium  medicines that relax muscles for surgery  metformin  narcotic medicines for pain  NSAIDs, medicines for pain and inflammation, like ibuprofen or naproxen  phenothiazines like chlorpromazine, mesoridazine, prochlorperazine, thioridazine  pioglitazone This list may not describe all possible interactions. Give your health care provider a list of all the medicines, herbs, non-prescription drugs, or dietary supplements you use. Also tell them if you smoke, drink alcohol, or use illegal drugs. Some items may interact with your medicine. What should I watch for while using this medicine? Visit your doctor or health care professional for regular checks on your progress. Tell your health care professional if your symptoms do not   start to get better or if they get worse. Do not stop taking except on your health care professional's  advice. You may develop a severe reaction. Your health care professional will tell you how much medicine to take. Wear a medical ID bracelet or chain. Carry a card that describes your disease and details of your medicine and dosage times. This medicine can reduce the response of your body to heat or cold. Dress warm in cold weather and stay hydrated in hot weather. If possible, avoid extreme temperatures like saunas, hot tubs, very hot or cold showers, or activities that can cause dehydration such as vigorous exercise. Check with your health care professional if you have severe diarrhea, nausea, and vomiting, or if you sweat a lot. The loss of too much body fluid may make it dangerous for you to take this medicine. You may get drowsy or dizzy. Do not drive, use machinery, or do anything that needs mental alertness until you know how this medicine affects you. Do not stand up or sit up quickly, especially if you are an older patient. This reduces the risk of dizzy or fainting spells. Alcohol may interfere with the effect of this medicine. Avoid alcoholic drinks. Tell your health care professional right away if you have any change in your eyesight. Patients and their families should watch out for new or worsening depression or thoughts of suicide. Also watch out for sudden changes in feelings such as feeling anxious, agitated, panicky, irritable, hostile, aggressive, impulsive, severely restless, overly excited and hyperactive, or not being able to sleep. If this happens, especially at the beginning of treatment or after a change in dose, call your healthcare professional. This medicine may cause serious skin reactions. They can happen weeks to months after starting the medicine. Contact your health care provider right away if you notice fevers or flu-like symptoms with a rash. The rash may be red or purple and then turn into blisters or peeling of the skin. Or, you might notice a red rash with swelling of the  face, lips or lymph nodes in your neck or under your arms. Birth control may not work properly while you are taking this medicine. Talk to your health care professional about using an extra method of birth control. Women should inform their health care professional if they wish to become pregnant or think they might be pregnant. There is a potential for serious side effects and harm to an unborn child. Talk to your health care professional for more information. What side effects may I notice from receiving this medicine? Side effects that you should report to your doctor or health care professional as soon as possible:  allergic reactions like skin rash, itching or hives, swelling of the face, lips, or tongue  blood in the urine  changes in vision  confusion  loss of memory  pain in lower back or side  pain when urinating  redness, blistering, peeling or loosening of the skin, including inside the mouth  signs and symptoms of bleeding such as bloody or black, tarry stools; red or dark brown urine; spitting up blood or brown material that looks like coffee grounds; red spots on the skin; unusual bruising or bleeding from the eyes, gums, or nose  signs and symptoms of increased acid in the body like breathing fast; fast heartbeat; headache; confusion; unusually weak or tired; nausea, vomiting  suicidal thoughts, mood changes  trouble speaking or understanding  unusual sweating  unusually weak or tired Side effects   that usually do not require medical attention (report to your doctor or health care professional if they continue or are bothersome):  dizziness  drowsiness  fever  loss of appetite  nausea, vomiting  pain, tingling, numbness in the hands or feet  stomach pain  tiredness  upset stomach This list may not describe all possible side effects. Call your doctor for medical advice about side effects. You may report side effects to FDA at 1-800-FDA-1088. Where  should I keep my medicine? Keep out of the reach of children. Store at room temperature between 15 and 30 degrees C (59 and 86 degrees F). Throw away any unused medicine after the expiration date. NOTE: This sheet is a summary. It may not cover all possible information. If you have questions about this medicine, talk to your doctor, pharmacist, or health care provider.  2020 Elsevier/Gold Standard (2018-08-15 15:07:20) Migraine Headache A migraine headache is a very strong throbbing pain on one side or both sides of your head. This type of headache can also cause other symptoms. It can last from 4 hours to 3 days. Talk with your doctor about what things may bring on (trigger) this condition. What are the causes? The exact cause of this condition is not known. This condition may be triggered or caused by:  Drinking alcohol.  Smoking.  Taking medicines, such as: ? Medicine used to treat chest pain (nitroglycerin). ? Birth control pills. ? Estrogen. ? Some blood pressure medicines.  Eating or drinking certain products.  Doing physical activity. Other things that may trigger a migraine headache include:  Having a menstrual period.  Pregnancy.  Hunger.  Stress.  Not getting enough sleep or getting too much sleep.  Weather changes.  Tiredness (fatigue). What increases the risk?  Being 2-61 years old.  Being female.  Having a family history of migraine headaches.  Being Caucasian.  Having depression or anxiety.  Being very overweight. What are the signs or symptoms?  A throbbing pain. This pain may: ? Happen in any area of the head, such as on one side or both sides. ? Make it hard to do daily activities. ? Get worse with physical activity. ? Get worse around bright lights or loud noises.  Other symptoms may include: ? Feeling sick to your stomach (nauseous). ? Vomiting. ? Dizziness. ? Being sensitive to bright lights, loud noises, or smells.  Before you  get a migraine headache, you may get warning signs (an aura). An aura may include: ? Seeing flashing lights or having blind spots. ? Seeing bright spots, halos, or zigzag lines. ? Having tunnel vision or blurred vision. ? Having numbness or a tingling feeling. ? Having trouble talking. ? Having weak muscles.  Some people have symptoms after a migraine headache (postdromal phase), such as: ? Tiredness. ? Trouble thinking (concentrating). How is this treated?  Taking medicines that: ? Relieve pain. ? Relieve the feeling of being sick to your stomach. ? Prevent migraine headaches.  Treatment may also include: ? Having acupuncture. ? Avoiding foods that bring on migraine headaches. ? Learning ways to control your body functions (biofeedback). ? Therapy to help you know and deal with negative thoughts (cognitive behavioral therapy). Follow these instructions at home: Medicines  Take over-the-counter and prescription medicines only as told by your doctor.  Ask your doctor if the medicine prescribed to you: ? Requires you to avoid driving or using heavy machinery. ? Can cause trouble pooping (constipation). You may need to take these steps to  prevent or treat trouble pooping:  Drink enough fluid to keep your pee (urine) pale yellow.  Take over-the-counter or prescription medicines.  Eat foods that are high in fiber. These include beans, whole grains, and fresh fruits and vegetables.  Limit foods that are high in fat and sugar. These include fried or sweet foods. Lifestyle  Do not drink alcohol.  Do not use any products that contain nicotine or tobacco, such as cigarettes, e-cigarettes, and chewing tobacco. If you need help quitting, ask your doctor.  Get at least 8 hours of sleep every night.  Limit and deal with stress. General instructions      Keep a journal to find out what may bring on your migraine headaches. For example, write down: ? What you eat and  drink. ? How much sleep you get. ? Any change in what you eat or drink. ? Any change in your medicines.  If you have a migraine headache: ? Avoid things that make your symptoms worse, such as bright lights. ? It may help to lie down in a dark, quiet room. ? Do not drive or use heavy machinery. ? Ask your doctor what activities are safe for you.  Keep all follow-up visits as told by your doctor. This is important. Contact a doctor if:  You get a migraine headache that is different or worse than others you have had.  You have more than 15 headache days in one month. Get help right away if:  Your migraine headache gets very bad.  Your migraine headache lasts longer than 72 hours.  You have a fever.  You have a stiff neck.  You have trouble seeing.  Your muscles feel weak or like you cannot control them.  You start to lose your balance a lot.  You start to have trouble walking.  You pass out (faint).  You have a seizure. Summary  A migraine headache is a very strong throbbing pain on one side or both sides of your head. These headaches can also cause other symptoms.  This condition may be treated with medicines and changes to your lifestyle.  Keep a journal to find out what may bring on your migraine headaches.  Contact a doctor if you get a migraine headache that is different or worse than others you have had.  Contact your doctor if you have more than 15 headache days in a month. This information is not intended to replace advice given to you by your health care provider. Make sure you discuss any questions you have with your health care provider. Document Revised: 05/10/2018 Document Reviewed: 02/28/2018 Elsevier Patient Education  2020 [employer] Insomnia Insomnia is a sleep disorder that makes it difficult to fall asleep or stay asleep. Insomnia can cause fatigue, low energy, difficulty concentrating, mood swings, and poor performance at work or  school. There are three different ways to classify insomnia:  Difficulty falling asleep.  Difficulty staying asleep.  Waking up too early in the morning. Any type of insomnia can be long-term (chronic) or short-term (acute). Both are common. Short-term insomnia usually lasts for three months or less. Chronic insomnia occurs at least three times a week for longer than three months. What are the causes? Insomnia may be caused by another condition, situation, or substance, such as:  Anxiety.  Certain medicines.  Gastroesophageal reflux disease (GERD) or other gastrointestinal conditions.  Asthma or other breathing conditions.  Restless legs syndrome, sleep apnea, or other sleep disorders.  Chronic pain.  Menopause.  Stroke.  Abuse of alcohol, tobacco, or illegal drugs.  Mental health conditions, such as depression.  Caffeine.  Neurological disorders, such as Alzheimer's disease.  An overactive thyroid (hyperthyroidism). Sometimes, the cause of insomnia may not be known. What increases the risk? Risk factors for insomnia include:  Gender. Women are affected more often than men.  Age. Insomnia is more common as you get older.  Stress.  Lack of exercise.  Irregular work schedule or working night shifts.  Traveling between different time zones.  Certain medical and mental health conditions. What are the signs or symptoms? If you have insomnia, the main symptom is having trouble falling asleep or having trouble staying asleep. This may lead to other symptoms, such as:  Feeling fatigued or having low energy.  Feeling nervous about going to sleep.  Not feeling rested in the morning.  Having trouble concentrating.  Feeling irritable, anxious, or depressed. How is this diagnosed? This condition may be diagnosed based on:  Your symptoms and medical history. Your health care provider may ask about: ? Your sleep habits. ? Any medical conditions you  have. ? Your mental health.  A physical exam. How is this treated? Treatment for insomnia depends on the cause. Treatment may focus on treating an underlying condition that is causing insomnia. Treatment may also include:  Medicines to help you sleep.  Counseling or therapy.  Lifestyle adjustments to help you sleep better. Follow these instructions at home: Eating and drinking   Limit or avoid alcohol, caffeinated beverages, and cigarettes, especially close to bedtime. These can disrupt your sleep.  Do not eat a large meal or eat spicy foods right before bedtime. This can lead to digestive discomfort that can make it hard for you to sleep. Sleep habits   Keep a sleep diary to help you and your health care provider figure out what could be causing your insomnia. Write down: ? When you sleep. ? When you wake up during the night. ? How well you sleep. ? How rested you feel the next day. ? Any side effects of medicines you are taking. ? What you eat and drink.  Make your bedroom a dark, comfortable place where it is easy to fall asleep. ? Put up shades or blackout curtains to block light from outside. ? Use a white noise machine to block noise. ? Keep the temperature cool.  Limit screen use before bedtime. This includes: ? Watching TV. ? Using your smartphone, tablet, or computer.  Stick to a routine that includes going to bed and waking up at the same times every day and night. This can help you fall asleep faster. Consider making a quiet activity, such as reading, part of your nighttime routine.  Try to avoid taking naps during the day so that you sleep better at night.  Get out of bed if you are still awake after 15 minutes of trying to sleep. Keep the lights down, but try reading or doing a quiet activity. When you feel sleepy, go back to bed. General instructions  Take over-the-counter and prescription medicines only as told by your health care provider.  Exercise  regularly, as told by your health care provider. Avoid exercise starting several hours before bedtime.  Use relaxation techniques to manage stress. Ask your health care provider to suggest some techniques that may work well for you. These may include: ? Breathing exercises. ? Routines to release muscle tension. ? Visualizing peaceful scenes.  Make sure that you drive carefully. Avoid driving  if you feel very sleepy.  Keep all follow-up visits as told by your health care provider. This is important. Contact a health care provider if:  You are tired throughout the day.  You have trouble in your daily routine due to sleepiness.  You continue to have sleep problems, or your sleep problems get worse. Get help right away if:  You have serious thoughts about hurting yourself or someone else. If you ever feel like you may hurt yourself or others, or have thoughts about taking your own life, get help right away. You can go to your nearest emergency department or call:  Your local emergency services (911 in the [place]  A suicide crisis helpline, such as the National Suicide Prevention Lifeline at 508-689-5724. This is open 24 hours a day. Summary  Insomnia is a sleep disorder that makes it difficult to fall asleep or stay asleep.  Insomnia can be long-term (chronic) or short-term (acute).  Treatment for insomnia depends on the cause. Treatment may focus on treating an underlying condition that is causing insomnia.  Keep a sleep diary to help you and your health care provider figure out what could be causing your insomnia. This information is not intended to replace advice given to you by your health care provider. Make sure you discuss any questions you have with your health care provider. Document Revised: 12/29/2016 Document Reviewed: 10/26/2016 Elsevier Patient Education  2020 [employer]. Trazodone tablets What is this medicine? TRAZODONE (TRAZ oh done) is used to treat  depression. This medicine may be used for other purposes; ask your health care provider or pharmacist if you have questions. COMMON BRAND NAME(S): Desyrel What should I tell my health care provider before I take this medicine? They need to know if you have any of these conditions:  attempted suicide or thinking about it  bipolar disorder  bleeding problems  glaucoma  heart disease, or previous heart attack  irregular heart beat  kidney or liver disease  low levels of sodium in the blood  an unusual or allergic reaction to trazodone, other medicines, foods, dyes or preservatives  pregnant or trying to get pregnant  breast-feeding How should I use this medicine? Take this medicine by mouth with a glass of water. Follow the directions on the prescription label. Take this medicine shortly after a meal or a light snack. Take your medicine at regular intervals. Do not take your medicine more often than directed. Do not stop taking this medicine suddenly except upon the advice of your doctor. Stopping this medicine too quickly may cause serious side effects or your condition may worsen. A special MedGuide will be given to you by the pharmacist with each prescription and refill. Be sure to read this information carefully each time. Talk to your pediatrician regarding the use of this medicine in children. Special care may be needed. Overdosage: If you think you have taken too much of this medicine contact a poison control center or emergency room at once. NOTE: This medicine is only for you. Do not share this medicine with others. What if I miss a dose? If you miss a dose, take it as soon as you can. If it is almost time for your next dose, take only that dose. Do not take double or extra doses. What may interact with this medicine? Do not take this medicine with any of the following medications:  certain medicines for fungal infections like fluconazole, itraconazole, ketoconazole,  posaconazole, voriconazole  cisapride  dronedarone  linezolid  MAOIs like Carbex, Eldepryl, Marplan, Nardil, and Parnate  mesoridazine  methylene blue (injected into a vein)  pimozide  saquinavir  thioridazine This medicine may also interact with the following medications:  alcohol  antiviral medicines for HIV or AIDS  aspirin and aspirin-like medicines  barbiturates like phenobarbital  certain medicines for blood pressure, heart disease, irregular heart beat  certain medicines for depression, anxiety, or psychotic disturbances  certain medicines for migraine headache like almotriptan, eletriptan, frovatriptan, naratriptan, rizatriptan, sumatriptan, zolmitriptan  certain medicines for seizures like carbamazepine and phenytoin  certain medicines for sleep  certain medicines that treat or prevent blood clots like dalteparin, enoxaparin, warfarin  digoxin  fentanyl  lithium  NSAIDS, medicines for pain and inflammation, like ibuprofen or naproxen  other medicines that prolong the QT interval (cause an abnormal heart rhythm) like dofetilide  rasagiline  supplements like St. John's wort, kava kava, valerian  tramadol  tryptophan This list may not describe all possible interactions. Give your health care provider a list of all the medicines, herbs, non-prescription drugs, or dietary supplements you use. Also tell them if you smoke, drink alcohol, or use illegal drugs. Some items may interact with your medicine. What should I watch for while using this medicine? Tell your doctor if your symptoms do not get better or if they get worse. Visit your doctor or health care professional for regular checks on your progress. Because it may take several weeks to see the full effects of this medicine, it is important to continue your treatment as prescribed by your doctor. Patients and their families should watch out for new or worsening thoughts of suicide or depression.  Also watch out for sudden changes in feelings such as feeling anxious, agitated, panicky, irritable, hostile, aggressive, impulsive, severely restless, overly excited and hyperactive, or not being able to sleep. If this happens, especially at the beginning of treatment or after a change in dose, call your health care professional. Bonita Quin may get drowsy or dizzy. Do not drive, use machinery, or do anything that needs mental alertness until you know how this medicine affects you. Do not stand or sit up quickly, especially if you are an older patient. This reduces the risk of dizzy or fainting spells. Alcohol may interfere with the effect of this medicine. Avoid alcoholic drinks. This medicine may cause dry eyes and blurred vision. If you wear contact lenses you may feel some discomfort. Lubricating drops may help. See your eye doctor if the problem does not go away or is severe. Your mouth may get dry. Chewing sugarless gum, sucking hard candy and drinking plenty of water may help. Contact your doctor if the problem does not go away or is severe. What side effects may I notice from receiving this medicine? Side effects that you should report to your doctor or health care professional as soon as possible:  allergic reactions like skin rash, itching or hives, swelling of the face, lips, or tongue  elevated mood, decreased need for sleep, racing thoughts, impulsive behavior  confusion  fast, irregular heartbeat  feeling faint or lightheaded, falls  feeling agitated, angry, or irritable  loss of balance or coordination  painful or prolonged erections  restlessness, pacing, inability to keep still  suicidal thoughts or other mood changes  tremors  trouble sleeping  seizures  unusual bleeding or bruising Side effects that usually do not require medical attention (report to your doctor or health care professional if they continue or are bothersome):  change in sex drive  or  performance  change in appetite or weight  constipation  headache  muscle aches or pains  nausea This list may not describe all possible side effects. Call your doctor for medical advice about side effects. You may report side effects to FDA at 1-800-FDA-1088. Where should I keep my medicine? Keep out of the reach of children. Store at room temperature between 15 and 30 degrees C (59 to 86 degrees F). Protect from light. Keep container tightly closed. Throw away any unused medicine after the expiration date. NOTE: This sheet is a summary. It may not cover all possible information. If you have questions about this medicine, talk to your doctor, pharmacist, or health care provider.  2020 Elsevier/Gold Standard (2018-01-08 11:46:46) Buspirone tablets What is this medicine? BUSPIRONE (byoo SPYE rone) is used to treat anxiety disorders. This medicine may be used for other purposes; ask your health care provider or pharmacist if you have questions. COMMON BRAND NAME(S): BuSpar What should I tell my health care provider before I take this medicine? They need to know if you have any of these conditions:  kidney or liver disease  an unusual or allergic reaction to buspirone, other medicines, foods, dyes, or preservatives  pregnant or trying to get pregnant  breast-feeding How should I use this medicine? Take this medicine by mouth with a glass of water. Follow the directions on the prescription label. You may take this medicine with or without food. To ensure that this medicine always works the same way for you, you should take it either always with or always without food. Take your doses at regular intervals. Do not take your medicine more often than directed. Do not stop taking except on the advice of your doctor or health care professional. Talk to your pediatrician regarding the use of this medicine in children. Special care may be needed. Overdosage: If you think you have taken too  much of this medicine contact a poison control center or emergency room at once. NOTE: This medicine is only for you. Do not share this medicine with others. What if I miss a dose? If you miss a dose, take it as soon as you can. If it is almost time for your next dose, take only that dose. Do not take double or extra doses. What may interact with this medicine? Do not take this medicine with any of the following medications:  linezolid  MAOIs like Carbex, Eldepryl, Marplan, Nardil, and Parnate  methylene blue  procarbazine This medicine may also interact with the following medications:  diazepam  digoxin  diltiazem  erythromycin  grapefruit juice  haloperidol  medicines for mental depression or mood problems  medicines for seizures like carbamazepine, phenobarbital and phenytoin  nefazodone  other medications for anxiety  rifampin  ritonavir  some antifungal medicines like itraconazole, ketoconazole, and voriconazole  verapamil  warfarin This list may not describe all possible interactions. Give your health care provider a list of all the medicines, herbs, non-prescription drugs, or dietary supplements you use. Also tell them if you smoke, drink alcohol, or use illegal drugs. Some items may interact with your medicine. What should I watch for while using this medicine? Visit your doctor or health care professional for regular checks on your progress. It may take 1 to 2 weeks before your anxiety gets better. You may get drowsy or dizzy. Do not drive, use machinery, or do anything that needs mental alertness until you know how this drug affects you. Do not stand or  sit up quickly, especially if you are an older patient. This reduces the risk of dizzy or fainting spells. Alcohol can make you more drowsy and dizzy. Avoid alcoholic drinks. What side effects may I notice from receiving this medicine? Side effects that you should report to your doctor or health care  professional as soon as possible:  blurred vision or other vision changes  chest pain  confusion  difficulty breathing  feelings of hostility or anger  muscle aches and pains  numbness or tingling in hands or feet  ringing in the ears  skin rash and itching  vomiting  weakness Side effects that usually do not require medical attention (report to your doctor or health care professional if they continue or are bothersome):  disturbed dreams, nightmares  headache  nausea  restlessness or nervousness  sore throat and nasal congestion  stomach upset This list may not describe all possible side effects. Call your doctor for medical advice about side effects. You may report side effects to FDA at 1-800-FDA-1088. Where should I keep my medicine? Keep out of the reach of children. Store at room temperature below 30 degrees C (86 degrees F). Protect from light. Keep container tightly closed. Throw away any unused medicine after the expiration date. NOTE: This sheet is a summary. It may not cover all possible information. If you have questions about this medicine, talk to your doctor, pharmacist, or health care provider.  2020 Elsevier/Gold Standard (2009-08-26 18:06:11) Managing Anxiety, Adult After being diagnosed with an anxiety disorder, you may be relieved to know why you have felt or behaved a certain way. You may also feel overwhelmed about the treatment ahead and what it will mean for your life. With care and support, you can manage this condition and recover from it. How to manage lifestyle changes Managing stress and anxiety  Stress is your body's reaction to life changes and events, both good and bad. Most stress will last just a few hours, but stress can be ongoing and can lead to more than just stress. Although stress can play a major role in anxiety, it is not the same as anxiety. Stress is usually caused by something external, such as a deadline, test, or  competition. Stress normally passes after the triggering event has ended.  Anxiety is caused by something internal, such as imagining a terrible outcome or worrying that something will go wrong that will devastate you. Anxiety often does not go away even after the triggering event is over, and it can become long-term (chronic) worry. It is important to understand the differences between stress and anxiety and to manage your stress effectively so that it does not lead to an anxious response. Talk with your health care provider or a counselor to learn more about reducing anxiety and stress. He or she may suggest tension reduction techniques, such as:  Music therapy. This can include creating or listening to music that you enjoy and that inspires you.  Mindfulness-based meditation. This involves being aware of your normal breaths while not trying to control your breathing. It can be done while sitting or walking.  Centering prayer. This involves focusing on a word, phrase, or sacred image that means something to you and brings you peace.  Deep breathing. To do this, expand your stomach and inhale slowly through your nose. Hold your breath for 3-5 seconds. Then exhale slowly, letting your stomach muscles relax.  Self-talk. This involves identifying thought patterns that lead to anxiety reactions and changing those  patterns.  Muscle relaxation. This involves tensing muscles and then relaxing them. Choose a tension reduction technique that suits your lifestyle and personality. These techniques take time and practice. Set aside 5-15 minutes a day to do them. Therapists can offer counseling and training in these techniques. The training to help with anxiety may be covered by some insurance plans. Other things you can do to manage stress and anxiety include:  Keeping a stress/anxiety diary. This can help you learn what triggers your reaction and then learn ways to manage your response.  Thinking about how  you react to certain situations. You may not be able to control everything, but you can control your response.  Making time for activities that help you relax and not feeling guilty about spending your time in this way.  Visual imagery and yoga can help you stay calm and relax.  Medicines Medicines can help ease symptoms. Medicines for anxiety include:  Anti-anxiety drugs.  Antidepressants. Medicines are often used as a primary treatment for anxiety disorder. Medicines will be prescribed by a health care provider. When used together, medicines, psychotherapy, and tension reduction techniques may be the most effective treatment. Relationships Relationships can play a big part in helping you recover. Try to spend more time connecting with trusted friends and family members. Consider going to couples counseling, taking family education classes, or going to family therapy. Therapy can help you and others better understand your condition. How to recognize changes in your anxiety Everyone responds differently to treatment for anxiety. Recovery from anxiety happens when symptoms decrease and stop interfering with your daily activities at home or work. This may mean that you will start to:  Have better concentration and focus. Worry will interfere less in your daily thinking.  Sleep better.  Be less irritable.  Have more energy.  Have improved memory. It is important to recognize when your condition is getting worse. Contact your health care provider if your symptoms interfere with home or work and you feel like your condition is not improving. Follow these instructions at home: Activity  Exercise. Most adults should do the following: ? Exercise for at least 150 minutes each week. The exercise should increase your heart rate and make you sweat (moderate-intensity exercise). ? Strengthening exercises at least twice a week.  Get the right amount and quality of sleep. Most adults need 7-9  hours of sleep each night. Lifestyle   Eat a healthy diet that includes plenty of vegetables, fruits, whole grains, low-fat dairy products, and lean protein. Do not eat a lot of foods that are high in solid fats, added sugars, or salt.  Make choices that simplify your life.  Do not use any products that contain nicotine or tobacco, such as cigarettes, e-cigarettes, and chewing tobacco. If you need help quitting, ask your health care provider.  Avoid caffeine, alcohol, and certain over-the-counter cold medicines. These may make you feel worse. Ask your pharmacist which medicines to avoid. General instructions  Take over-the-counter and prescription medicines only as told by your health care provider.  Keep all follow-up visits as told by your health care provider. This is important. Where to find support You can get help and support from these sources:  Self-help groups.  Online and [employer].  A trusted spiritual leader.  Couples counseling.  Family education classes.  Family therapy. Where to find more information You may find that joining a support group helps you deal with your anxiety. The following sources can help you locate  counselors or support groups near you:  Mental Health America: www.mentalhealthamerica.net  Anxiety and Depression Association of [place] (ADAA): ProgramCam.de  [employer] on Mental Illness (NAMI): www.nami.org Contact a health care provider if you:  Have a hard time staying focused or finishing daily tasks.  Spend many hours a day feeling worried about everyday life.  Become exhausted by worry.  Start to have headaches, feel tense, or have nausea.  Urinate more than normal.  Have diarrhea. Get help right away if you have:  A racing heart and shortness of breath.  Thoughts of hurting yourself or others. If you ever feel like you may hurt yourself or others, or have thoughts about taking your own life, get help  right away. You can go to your nearest emergency department or call:  Your local emergency services (911 in the [place]  A suicide crisis helpline, such as the National Suicide Prevention Lifeline at (209)364-4361. This is open 24 hours a day. Summary  Taking steps to learn and use tension reduction techniques can help calm you and help prevent triggering an anxiety reaction.  When used together, medicines, psychotherapy, and tension reduction techniques may be the most effective treatment.  Family, friends, and partners can play a big part in helping you recover from an anxiety disorder. This information is not intended to replace advice given to you by your health care provider. Make sure you discuss any questions you have with your health care provider. Document Revised: 06/18/2018 Document Reviewed: 06/18/2018 Elsevier Patient Education  2020 [employer].

## 2019-04-07 NOTE — Progress Notes (Signed)
[facility]    New Patient--Establish Care  Subjective:  Patient ID: [name], female    DOB: 11-21-1980  Age: 39 y.o. MRN: [id number]  CC:  Chief Complaint  Patient presents with  . New Patient (Initial Visit)    Est care  . Migraine  . Insomnia    HPI [name] is a 39 year old female who presents to Establish Care today.   Past Medical History:  Diagnosis Date  . Anemia   . Insomnia 04/07/2019  . Kidney stone   . Migraine    Current Status: This will be [name]'s initial office visit with me. She was not previously seeing a PCP regularly for her Physician needs. Since her last office visit, she has c/o headaches, migraines, which she has taken OTC pain medications for minor relief of symptoms. She denies fevers, chills, fatigue, recent infections, weight loss, and night sweats. She has not had any visual changes, dizziness, and falls. No chest pain, heart palpitations, cough and shortness of breath reported. No reports of GI problems such as nausea, vomiting, diarrhea, and constipation. She has no reports of blood in stools, dysuria and hematuria. Mild anxiety noted today. 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 No suicidal ideations, homicidal ideations, or auditory hallucinations. She denies pain today.   Past Surgical History:  Procedure Laterality Date  . CESAREAN SECTION    . TUBAL LIGATION Left June 2012  . WISDOM TOOTH EXTRACTION      Family History  Problem Relation Age of Onset  . Diabetes Mother   . Hypertension Mother   . Diabetes Maternal Grandmother   . Hypertension Maternal Grandmother   . Cancer Maternal Grandmother   . Diabetes Maternal Grandfather   . Hypertension Maternal Grandfather     Social History   Socioeconomic History  . Marital status: Single    Spouse name: Not on file  . Number of children: Not on file  . Years of education: Not on file  . Highest education  level: Not on file  Occupational History  . Not on file  Tobacco Use  . Smoking status: Never Smoker  . Smokeless tobacco: Never Used  Substance and Sexual Activity  . Alcohol use: No  . Drug use: No  . Sexual activity: Yes    Birth control/protection: Condom  Other Topics Concern  . Not on file  Social History Narrative  . Not on file   Social Determinants of Health   Financial Resource Strain:   . Difficulty of Paying Living Expenses:   Food Insecurity:   . Worried About Programme researcher, broadcasting/film/video in the Last Year:   . Barista in the Last Year:   Transportation Needs:   . Freight forwarder (Medical):   Marland Kitchen Lack of Transportation (Non-Medical):   Physical Activity:   . Days of Exercise per Week:   . Minutes of Exercise per Session:   Stress:   . Feeling of Stress :   Social Connections:   . Frequency of Communication with Friends and Family:   . Frequency of Social Gatherings with Friends and Family:   . Attends Religious Services:   . Active Member of Clubs or Organizations:   . Attends Banker Meetings:   Marland Kitchen Marital Status:   Intimate Partner Violence:   . Fear of Current or Ex-Partner:   . Emotionally Abused:   Marland Kitchen Physically Abused:   .  Sexually Abused:     Outpatient Medications Prior to Visit  Medication Sig Dispense Refill  . fluconazole (DIFLUCAN) 150 MG tablet Take 1 tablet (150 mg total) by mouth daily. Take second dose 72 hours later if symptoms still persists. 2 tablet 0   No facility-administered medications prior to visit.    No Known Allergies  ROS Review of Systems  Constitutional: Negative.   HENT: Negative.   Eyes: Negative.   Respiratory: Negative.   Cardiovascular: Negative.   Gastrointestinal: Positive for abdominal distention.  Endocrine: Negative.   Genitourinary: Negative.   Musculoskeletal: Negative.   Skin: Negative.   Allergic/Immunologic: Negative.   Neurological: Negative.   Hematological: Negative.     Psychiatric/Behavioral: Negative.    Objective:    Physical Exam  Constitutional: She is oriented to person, place, and time. She appears well-developed and well-nourished.  HENT:  Head: Normocephalic and atraumatic.  Eyes: Conjunctivae are normal.  Cardiovascular: Normal rate, regular rhythm, normal heart sounds and intact distal pulses.  Pulmonary/Chest: Effort normal and breath sounds normal.  Abdominal: Soft. Bowel sounds are normal.  Musculoskeletal:        General: Normal range of motion.     Cervical back: Normal range of motion and neck supple.  Neurological: She is alert and oriented to person, place, and time. She has normal reflexes.  Skin: Skin is warm and dry.  Psychiatric: She has a normal mood and affect. Her behavior is normal. Judgment and thought content normal.  Nursing note and vitals reviewed.   BP 115/76   Pulse 90   Temp 98.5 F (36.9 C) (Oral)   Ht 5\' 7"  (1.702 m)   Wt 226 lb 3.2 oz (102.6 kg)   LMP 04/04/2019   BMI 35.43 kg/m  Wt Readings from Last 3 Encounters:  04/07/19 226 lb 3.2 oz (102.6 kg)  02/14/18 213 lb (96.6 kg)  09/25/16 193 lb (87.5 kg)     Health Maintenance Due  Topic Date Due  . HIV Screening  Never done  . TETANUS/TDAP  Never done  . PAP SMEAR-Modifier  Never done  . INFLUENZA VACCINE  Never done    There are no preventive care reminders to display for this patient.  Lab Results  Component Value Date   TSH 0.685 04/07/2019   Lab Results  Component Value Date   WBC 8.4 04/07/2019   HGB 12.8 04/07/2019   HCT 42.8 04/07/2019   MCV 73 (L) 04/07/2019   PLT 379 04/07/2019   Lab Results  Component Value Date   NA 137 04/07/2019   K 4.3 04/07/2019   CO2 20 04/07/2019   GLUCOSE 97 04/07/2019   BUN 5 (L) 04/07/2019   CREATININE 0.77 04/07/2019   BILITOT 0.3 04/07/2019   ALKPHOS 56 04/07/2019   AST 15 04/07/2019   ALT 8 04/07/2019   PROT 7.2 04/07/2019   ALBUMIN 4.2 04/07/2019   CALCIUM 9.3 04/07/2019    ANIONGAP 8 09/25/2016   Lab Results  Component Value Date   CHOL 162 04/07/2019   Lab Results  Component Value Date   HDL 44 04/07/2019   Lab Results  Component Value Date   LDLCALC 103 (H) 04/07/2019   Lab Results  Component Value Date   TRIG 77 04/07/2019   Lab Results  Component Value Date   CHOLHDL 3.7 04/07/2019   Lab Results  Component Value Date   HGBA1C 7.1 (A) 04/07/2019    Assessment & Plan:   1. Encounter to establish care  2. Insomnia, unspecified type We will initiate Trazodone today.   3. Nonintractable headache, unspecified chronicity pattern, unspecified headache type We will initiate Topamax today.   4. Anxiety We will initiate Buspar today. - busPIRone (BUSPAR) 5 MG tablet; Take 1 tablet (5 mg total) by mouth 2 (two) times daily.  Dispense: 60 tablet; Refill: 3 - traZODone (DESYREL) 50 MG tablet; Take 1 tablet (50 mg total) by mouth at bedtime.  Dispense: 30 tablet; Refill: 3  5. Health care maintenance - POCT urinalysis dipstick - POCT glycosylated hemoglobin (Hb A1C) - POCT glucose (manual entry) - CBC with Differential - Comprehensive metabolic panel - Lipid Panel - TSH - Vitamin B12 - Vitamin D, 25-hydroxy  6. Follow up She will follow up in 6 months for Office visit Follow up for Pap Smear as soon as she can.   Meds ordered this encounter  Medications  . busPIRone (BUSPAR) 5 MG tablet    Sig: Take 1 tablet (5 mg total) by mouth 2 (two) times daily.    Dispense:  60 tablet    Refill:  3  . topiramate (TOPAMAX) 25 MG tablet    Sig: Take 1 tablet (25 mg total) by mouth 2 (two) times daily.    Dispense:  60 tablet    Refill:  3  . traZODone (DESYREL) 50 MG tablet    Sig: Take 1 tablet (50 mg total) by mouth at bedtime.    Dispense:  30 tablet    Refill:  3    Orders Placed This Encounter  Procedures  . CBC with Differential  . Comprehensive metabolic panel  . Lipid Panel  . TSH  . Vitamin B12  . Vitamin D, 25-hydroxy   . POCT urinalysis dipstick  . POCT glycosylated hemoglobin (Hb A1C)  . POCT glucose (manual entry)    Referral Orders  No referral(s) requested today    Raliegh Ip,  MSN, FNP-BC [facility] [address] [place], [place] [address] [phone] fax   Problem List Items Addressed This Visit    None    Visit Diagnoses    Encounter to establish care    -  Primary   Insomnia, unspecified type       Nonintractable headache, unspecified chronicity pattern, unspecified headache type       Relevant Medications   topiramate (TOPAMAX) 25 MG tablet   traZODone (DESYREL) 50 MG tablet   Anxiety       Relevant Medications   busPIRone (BUSPAR) 5 MG tablet   traZODone (DESYREL) 50 MG tablet   Health care maintenance       Relevant Orders   POCT urinalysis dipstick (Completed)   POCT glycosylated hemoglobin (Hb A1C) (Completed)   POCT glucose (manual entry) (Completed)   CBC with Differential (Completed)   Comprehensive metabolic panel (Completed)   Lipid Panel (Completed)   TSH (Completed)   Vitamin B12 (Completed)   Vitamin D, 25-hydroxy (Completed)   Follow up          Meds ordered this encounter  Medications  . busPIRone (BUSPAR) 5 MG tablet    Sig: Take 1 tablet (5 mg total) by mouth 2 (two) times daily.    Dispense:  60 tablet    Refill:  3  . topiramate (TOPAMAX) 25 MG tablet    Sig: Take 1 tablet (25 mg total) by mouth 2 (two) times daily.    Dispense:  60 tablet  Refill:  3  . traZODone (DESYREL) 50 MG tablet    Sig: Take 1 tablet (50 mg total) by mouth at bedtime.    Dispense:  30 tablet    Refill:  3    Follow-up: No follow-ups on file.    Kallie Locks, FNP

## 2019-04-08 LAB — COMPREHENSIVE METABOLIC PANEL
ALT: 8 IU/L (ref 0–32)
AST: 15 IU/L (ref 0–40)
Albumin/Globulin Ratio: 1.4 (ref 1.2–2.2)
Albumin: 4.2 g/dL (ref 3.8–4.8)
Alkaline Phosphatase: 56 IU/L (ref 39–117)
BUN/Creatinine Ratio: 6 — ABNORMAL LOW (ref 9–23)
BUN: 5 mg/dL — ABNORMAL LOW (ref 6–24)
Bilirubin Total: 0.3 mg/dL (ref 0.0–1.2)
CO2: 20 mmol/L (ref 20–29)
Calcium: 9.3 mg/dL (ref 8.7–10.2)
Chloride: 103 mmol/L (ref 96–106)
Creatinine, Ser: 0.77 mg/dL (ref 0.57–1.00)
GFR calc Af Amer: 112 mL/min/{1.73_m2} (ref >59)
GFR calc non Af Amer: 97 mL/min/{1.73_m2} (ref >59)
Globulin, Total: 3 g/dL (ref 1.5–4.5)
Glucose: 97 mg/dL (ref 65–99)
Potassium: 4.3 mmol/L (ref 3.5–5.2)
Sodium: 137 mmol/L (ref 134–144)
Total Protein: 7.2 g/dL (ref 6.0–8.5)

## 2019-04-08 LAB — LIPID PANEL
Chol/HDL Ratio: 3.7 ratio (ref 0.0–4.4)
Cholesterol, Total: 162 mg/dL (ref 100–199)
HDL: 44 mg/dL (ref >39)
LDL Chol Calc (NIH): 103 mg/dL — ABNORMAL HIGH (ref 0–99)
Triglycerides: 77 mg/dL (ref 0–149)
VLDL Cholesterol Cal: 15 mg/dL (ref 5–40)

## 2019-04-08 LAB — CBC WITH DIFFERENTIAL/PLATELET
Basophils Absolute: 0.1 10*3/uL (ref 0.0–0.2)
Basos: 1 %
EOS (ABSOLUTE): 0.2 10*3/uL (ref 0.0–0.4)
Eos: 3 %
Hematocrit: 42.8 % (ref 34.0–46.6)
Hemoglobin: 12.8 g/dL (ref 11.1–15.9)
Immature Grans (Abs): 0 10*3/uL (ref 0.0–0.1)
Immature Granulocytes: 0 %
Lymphocytes Absolute: 4.5 10*3/uL — ABNORMAL HIGH (ref 0.7–3.1)
Lymphs: 53 %
MCH: 21.9 pg — ABNORMAL LOW (ref 26.6–33.0)
MCHC: 29.9 g/dL — ABNORMAL LOW (ref 31.5–35.7)
MCV: 73 fL — ABNORMAL LOW (ref 79–97)
Monocytes Absolute: 0.5 10*3/uL (ref 0.1–0.9)
Monocytes: 6 %
Neutrophils Absolute: 3.1 10*3/uL (ref 1.4–7.0)
Neutrophils: 37 %
Platelets: 379 10*3/uL (ref 150–450)
RBC: 5.85 x10E6/uL — ABNORMAL HIGH (ref 3.77–5.28)
RDW: 14.4 % (ref 11.7–15.4)
WBC: 8.4 10*3/uL (ref 3.4–10.8)

## 2019-04-08 LAB — VITAMIN D 25 HYDROXY (VIT D DEFICIENCY, FRACTURES): Vit D, 25-Hydroxy: 6.4 ng/mL — ABNORMAL LOW (ref 30.0–100.0)

## 2019-04-08 LAB — VITAMIN B12: Vitamin B-12: 447 pg/mL (ref 232–1245)

## 2019-04-08 LAB — TSH: TSH: 0.685 u[IU]/mL (ref 0.450–4.500)

## 2019-04-10 ENCOUNTER — Encounter: Payer: Self-pay | Admitting: Family Medicine

## 2019-04-10 DIAGNOSIS — F419 Anxiety disorder, unspecified: Secondary | ICD-10-CM | POA: Insufficient documentation

## 2019-04-10 DIAGNOSIS — R519 Headache, unspecified: Secondary | ICD-10-CM | POA: Insufficient documentation

## 2019-04-10 DIAGNOSIS — G4700 Insomnia, unspecified: Secondary | ICD-10-CM | POA: Insufficient documentation

## 2019-04-13 ENCOUNTER — Encounter: Payer: Self-pay | Admitting: Family Medicine

## 2019-04-13 ENCOUNTER — Other Ambulatory Visit: Payer: Self-pay | Admitting: Family Medicine

## 2019-04-13 DIAGNOSIS — E559 Vitamin D deficiency, unspecified: Secondary | ICD-10-CM

## 2019-04-13 MED ORDER — VITAMIN D (ERGOCALCIFEROL) 1.25 MG (50000 UNIT) PO CAPS: 50000.0000 [IU] | ORAL_CAPSULE | ORAL | 6 refills | Status: DC

## 2019-04-29 ENCOUNTER — Other Ambulatory Visit: Payer: Self-pay | Admitting: Family Medicine

## 2019-04-29 DIAGNOSIS — F419 Anxiety disorder, unspecified: Secondary | ICD-10-CM

## 2019-06-29 ENCOUNTER — Other Ambulatory Visit: Payer: Self-pay | Admitting: Family Medicine

## 2019-07-29 ENCOUNTER — Ambulatory Visit: Payer: 59 | Admitting: Family Medicine

## 2019-08-05 ENCOUNTER — Ambulatory Visit: Payer: No Typology Code available for payment source | Admitting: Family Medicine

## 2019-09-24 ENCOUNTER — Ambulatory Visit
Admission: EM | Admit: 2019-09-24 | Discharge: 2019-09-24 | Disposition: A | Discharge status: Home / Self Care | Payer: No Typology Code available for payment source | Attending: Physician Assistant | Admitting: Physician Assistant

## 2019-09-24 ENCOUNTER — Other Ambulatory Visit: Payer: Self-pay

## 2019-09-24 DIAGNOSIS — R1011 Right upper quadrant pain: Secondary | ICD-10-CM | POA: Insufficient documentation

## 2019-09-24 DIAGNOSIS — Z79899 Other long term (current) drug therapy: Secondary | ICD-10-CM | POA: Insufficient documentation

## 2019-09-24 DIAGNOSIS — R1030 Lower abdominal pain, unspecified: Secondary | ICD-10-CM | POA: Insufficient documentation

## 2019-09-24 DIAGNOSIS — G4700 Insomnia, unspecified: Secondary | ICD-10-CM | POA: Insufficient documentation

## 2019-09-24 DIAGNOSIS — Z202 Contact with and (suspected) exposure to infections with a predominantly sexual mode of transmission: Secondary | ICD-10-CM | POA: Diagnosis present

## 2019-09-24 DIAGNOSIS — F419 Anxiety disorder, unspecified: Secondary | ICD-10-CM | POA: Insufficient documentation

## 2019-09-24 DIAGNOSIS — R69 Illness, unspecified: Secondary | ICD-10-CM | POA: Diagnosis not present

## 2019-09-24 LAB — POCT URINALYSIS DIP (MANUAL ENTRY)
Bilirubin, UA: NEGATIVE
Blood, UA: NEGATIVE
Glucose, UA: NEGATIVE mg/dL
Ketones, POC UA: NEGATIVE mg/dL
Leukocytes, UA: NEGATIVE
Nitrite, UA: NEGATIVE
Protein Ur, POC: NEGATIVE mg/dL
Spec Grav, UA: >=1.03 — AB (ref 1.010–1.025)
Urobilinogen, UA: 0.2 E.U./dL
pH, UA: 5.5 (ref 5.0–8.0)

## 2019-09-24 LAB — POCT URINE PREGNANCY: Preg Test, Ur: NEGATIVE

## 2019-09-24 MED ORDER — DOXYCYCLINE HYCLATE 100 MG PO CAPS: 100.0000 mg | ORAL_CAPSULE | Freq: Two times a day (BID) | ORAL | 0 refills | Status: DC

## 2019-09-24 NOTE — Discharge Instructions (Signed)
You were treated empirically for chlamydia. Start doxycycline as directed. Cytology sent, you will be contacted with any positive results that requires further treatment. Refrain from sexual activity and alcohol use for the next 7 days. Monitor for any worsening of symptoms, fever, abdominal pain, nausea, vomiting, go to the emergency department for further evaluation.

## 2019-09-24 NOTE — ED Provider Notes (Signed)
[facility]    CSN: [id number] Arrival date & time: 09/24/19  4696      History   Chief Complaint Chief Complaint  Patient presents with  . Exposure to STD    HPI [name] is a 39 y.o. female.   39 year old female comes in for STD testing after positive exposure to chlamydia. Has had mild lower and RUQ abdominal pain that is intermittent for the past week. Denies vaginal discharge, rashes. Denies urinary changes. Denies nausea, vomiting, diarrhea. LMP 09/12/2019. Sexually active with one female partner.     Past Medical History:  Diagnosis Date  . Anemia   . Anxiety 03/2019  . Insomnia 04/07/2019  . Kidney stone   . Migraine   . Vitamin D deficiency 03/2019    Patient Active Problem List   Diagnosis Date Noted  . Nonintractable headache 04/10/2019  . Anxiety 04/10/2019  . Insomnia 04/10/2019    Past Surgical History:  Procedure Laterality Date  . CESAREAN SECTION    . TUBAL LIGATION Left June 2012  . WISDOM TOOTH EXTRACTION      OB History    Gravida  6   Para  3   Term  3   Preterm      AB  3   Living  3     SAB  1   TAB  1   Ectopic  1   Multiple      Live Births               Home Medications    Prior to Admission medications   Medication Sig Start Date End Date Taking? Authorizing Provider  Vitamin D, Ergocalciferol, (DRISDOL) 1.25 MG (50000 UNIT) CAPS capsule Take 1 capsule (50,000 Units total) by mouth every 7 (seven) days. 04/13/19  Yes Kallie Locks, FNP  doxycycline (VIBRAMYCIN) 100 MG capsule Take 1 capsule (100 mg total) by mouth 2 (two) times daily. 09/24/19   Belinda Fisher, PA-C  traZODone (DESYREL) 50 MG tablet TAKE 1 TABLET BY MOUTH EVERYDAY AT BEDTIME 04/29/19   Kallie Locks, FNP  topiramate (TOPAMAX) 25 MG tablet TAKE 1 TABLET BY MOUTH TWICE A DAY 07/01/19 09/24/19  Kallie Locks, FNP    Family History Family History  Problem Relation Age of Onset  . Diabetes Mother   . Hypertension  Mother   . Diabetes Maternal Grandmother   . Hypertension Maternal Grandmother   . Cancer Maternal Grandmother   . Diabetes Maternal Grandfather   . Hypertension Maternal Grandfather     Social History Social History   Tobacco Use  . Smoking status: Never Smoker  . Smokeless tobacco: Never Used  Vaping Use  . Vaping Use: Never used  Substance Use Topics  . Alcohol use: No  . Drug use: No     Allergies   Patient has no known allergies.   Review of Systems Review of Systems  Reason unable to perform ROS: See HPI as above.     Physical Exam Triage Vital Signs ED Triage Vitals  Enc Vitals Group     BP 09/24/19 1051 126/83     Pulse Rate 09/24/19 1051 70     Resp 09/24/19 1051 18     Temp 09/24/19 1051 97.9 F (36.6 C)     Temp Source 09/24/19 1051 Oral     SpO2 09/24/19 1051 97 %     Weight --      Height --  Head Circumference --      Peak Flow --      Pain Score 09/24/19 1106 0     Pain Loc --      Pain Edu? --      Excl. in GC? --    No data found.  Updated Vital Signs BP 126/83 (BP Location: Left Arm)   Pulse 70   Temp 97.9 F (36.6 C) (Oral)   Resp 18   LMP 09/12/2019   SpO2 97%   Visual Acuity Right Eye Distance:   Left Eye Distance:   Bilateral Distance:    Right Eye Near:   Left Eye Near:    Bilateral Near:     Physical Exam Constitutional:      General: She is not in acute distress.    Appearance: She is well-developed. She is not ill-appearing, toxic-appearing or diaphoretic.  HENT:     Head: Normocephalic and atraumatic.  Eyes:     Conjunctiva/sclera: Conjunctivae normal.     Pupils: Pupils are equal, round, and reactive to light.  Cardiovascular:     Rate and Rhythm: Normal rate and regular rhythm.  Pulmonary:     Effort: Pulmonary effort is normal. No respiratory distress.     Comments: LCTAB Abdominal:     General: Bowel sounds are normal.     Palpations: Abdomen is soft.     Tenderness: There is no abdominal  tenderness. There is no right CVA tenderness, left CVA tenderness, guarding or rebound.  Musculoskeletal:     Cervical back: Normal range of motion and neck supple.  Skin:    General: Skin is warm and dry.  Neurological:     Mental Status: She is alert and oriented to person, place, and time.  Psychiatric:        Behavior: Behavior normal.        Judgment: Judgment normal.    UC Treatments / Results  Labs (all labs ordered are listed, but only abnormal results are displayed) Labs Reviewed  POCT URINALYSIS DIP (MANUAL ENTRY) - Abnormal; Notable for the following components:      Result Value   Spec Grav, UA >=1.030 (*)    All other components within normal limits  POCT URINE PREGNANCY  CERVICOVAGINAL ANCILLARY ONLY    EKG   Radiology No results found.  Procedures Procedures (including critical care time)  Medications Ordered in UC Medications - No data to display  Initial Impression / Assessment and Plan / UC Course  I have reviewed the triage vital signs and the nursing notes.  Pertinent labs & imaging results that were available during my care of the patient were reviewed by me and considered in my medical decision making (see chart for details).    Patient was treated empirically for chlamydia. Start doxycycline as directed. Cytology sent, patient will be contacted with any positive results that require additional treatment. Patient to refrain from sexual activity for the next 7 days. Return precautions given.   Final Clinical Impressions(s) / UC Diagnoses   Final diagnoses:  Exposure to STD    ED Prescriptions    Medication Sig Dispense Auth. Provider   doxycycline (VIBRAMYCIN) 100 MG capsule Take 1 capsule (100 mg total) by mouth 2 (two) times daily. 14 capsule Belinda Fisher, PA-C     PDMP not reviewed this encounter.   Belinda Fisher, PA-C 09/24/19 1136

## 2019-09-24 NOTE — ED Triage Notes (Signed)
Pt requests STD testing 2/2 exposure to partner who tested positive for chlamydia. Pt c/o mild lower and RUQ abdominal pain intermittent in nature. Denies vaginal discharge, rash, fever, chills, n/v/d, dysuria sx.

## 2019-09-25 LAB — CERVICOVAGINAL ANCILLARY ONLY
Bacterial Vaginitis (gardnerella): POSITIVE — AB
Candida Glabrata: NEGATIVE
Candida Vaginitis: NEGATIVE
Chlamydia: NEGATIVE
Comment: NEGATIVE
Comment: NEGATIVE
Comment: NEGATIVE
Comment: NEGATIVE
Comment: NEGATIVE
Comment: NORMAL
Neisseria Gonorrhea: NEGATIVE
Trichomonas: POSITIVE — AB

## 2019-09-26 ENCOUNTER — Telehealth: Payer: Self-pay

## 2019-09-26 MED ORDER — METRONIDAZOLE 500 MG PO TABS: 500.0000 mg | ORAL_TABLET | Freq: Two times a day (BID) | ORAL | 0 refills | Status: DC

## 2019-10-07 ENCOUNTER — Ambulatory Visit: Payer: 59 | Admitting: Family Medicine

## 2019-10-14 ENCOUNTER — Ambulatory Visit
Admission: EM | Admit: 2019-10-14 | Discharge: 2019-10-14 | Disposition: A | Discharge status: Home / Self Care | Payer: No Typology Code available for payment source | Attending: Physician Assistant | Admitting: Physician Assistant

## 2019-10-14 ENCOUNTER — Other Ambulatory Visit: Payer: Self-pay

## 2019-10-14 DIAGNOSIS — B370 Candidal stomatitis: Secondary | ICD-10-CM | POA: Diagnosis not present

## 2019-10-14 MED ORDER — NYSTATIN 100000 UNIT/ML MT SUSP: 500000.0000 [IU] | Freq: Four times a day (QID) | OROMUCOSAL | 0 refills | Status: DC

## 2019-10-14 NOTE — ED Provider Notes (Signed)
[facility]    CSN: [id number] Arrival date & time: 10/14/19  0804      History   Chief Complaint Chief Complaint  Patient presents with  . Oral Swelling    HPI [name] is a 39 y.o. female.   39 year old female comes in for 4 day history of gum irritation/pain with white coating. This is to the right lower jaw. Denies dental pain. Denies oral swelling, tripoding, drooling, trismus. Denies fever. Had been taking doxycyline for exposure to chlamydia. Found to be positive for trich/BV and was told to switch to flagyl. However, states flagyl was on back order, and just picked it up yesterday. States she discontinued doxycycline approx 5 days ago.     Past Medical History:  Diagnosis Date  . Anemia   . Anxiety 03/2019  . Insomnia 04/07/2019  . Kidney stone   . Migraine   . Vitamin D deficiency 03/2019    Patient Active Problem List   Diagnosis Date Noted  . Nonintractable headache 04/10/2019  . Anxiety 04/10/2019  . Insomnia 04/10/2019    Past Surgical History:  Procedure Laterality Date  . CESAREAN SECTION    . TUBAL LIGATION Left June 2012  . WISDOM TOOTH EXTRACTION      OB History    Gravida  6   Para  3   Term  3   Preterm      AB  3   Living  3     SAB  1   TAB  1   Ectopic  1   Multiple      Live Births               Home Medications    Prior to Admission medications   Medication Sig Start Date End Date Taking? Authorizing Provider  nystatin (MYCOSTATIN) 100000 UNIT/ML suspension Take 5 mLs (500,000 Units total) by mouth 4 (four) times daily. 10/14/19   Belinda Fisher, PA-C  traZODone (DESYREL) 50 MG tablet TAKE 1 TABLET BY MOUTH EVERYDAY AT BEDTIME 04/29/19   Kallie Locks, FNP  topiramate (TOPAMAX) 25 MG tablet TAKE 1 TABLET BY MOUTH TWICE A DAY 07/01/19 09/24/19  Kallie Locks, FNP    Family History Family History  Problem Relation Age of Onset  . Diabetes Mother   . Hypertension Mother   . Diabetes  Maternal Grandmother   . Hypertension Maternal Grandmother   . Cancer Maternal Grandmother   . Diabetes Maternal Grandfather   . Hypertension Maternal Grandfather     Social History Social History   Tobacco Use  . Smoking status: Never Smoker  . Smokeless tobacco: Never Used  Vaping Use  . Vaping Use: Never used  Substance Use Topics  . Alcohol use: No  . Drug use: No     Allergies   Patient has no known allergies.   Review of Systems Review of Systems  Reason unable to perform ROS: See HPI as above.     Physical Exam Triage Vital Signs ED Triage Vitals  Enc Vitals Group     BP 10/14/19 0817 136/88     Pulse Rate 10/14/19 0817 76     Resp 10/14/19 0817 18     Temp 10/14/19 0817 98.1 F (36.7 C)     Temp Source 10/14/19 0817 Oral     SpO2 10/14/19 0817 97 %     Weight --      Height --  Head Circumference --      Peak Flow --      Pain Score 10/14/19 0818 10     Pain Loc --      Pain Edu? --      Excl. in GC? --    No data found.  Updated Vital Signs BP 136/88 (BP Location: Left Arm)   Pulse 76   Temp 98.1 F (36.7 C) (Oral)   Resp 18   LMP 10/10/2019   SpO2 97%   Physical Exam Constitutional:      General: She is not in acute distress.    Appearance: Normal appearance. She is well-developed. She is not toxic-appearing or diaphoretic.  HENT:     Head: Normocephalic and atraumatic.     Mouth/Throat:     Mouth: Mucous membranes are moist.     Pharynx: Oropharynx is clear. Uvula midline.     Comments: White coating that is not removed by friction to the right lower posterior molar area. Corresponding tooth to the area missing from prior removal. No other lesions seen. No coating to the tongue, posterior oropharynx.  Eyes:     Conjunctiva/sclera: Conjunctivae normal.     Pupils: Pupils are equal, round, and reactive to light.  Pulmonary:     Effort: Pulmonary effort is normal. No respiratory distress.  Musculoskeletal:     Cervical back:  Normal range of motion and neck supple.  Skin:    General: Skin is warm and dry.  Neurological:     Mental Status: She is alert and oriented to person, place, and time.      UC Treatments / Results  Labs (all labs ordered are listed, but only abnormal results are displayed) Labs Reviewed - No data to display  EKG   Radiology No results found.  Procedures Procedures (including critical care time)  Medications Ordered in UC Medications - No data to display  Initial Impression / Assessment and Plan / UC Course  I have reviewed the triage vital signs and the nursing notes.  Pertinent labs & imaging results that were available during my care of the patient were reviewed by me and considered in my medical decision making (see chart for details).    Will treat for thrush with nystatin. Continue flagyl for trich. Patient with dentist appointment in 2 weeks, to follow up for reevaluation if symptoms not improving. Return precautions given.  Final Clinical Impressions(s) / UC Diagnoses   Final diagnoses:  Thrush    ED Prescriptions    Medication Sig Dispense Auth. Provider   nystatin (MYCOSTATIN) 100000 UNIT/ML suspension Take 5 mLs (500,000 Units total) by mouth 4 (four) times daily. 60 mL Belinda Fisher, PA-C     PDMP not reviewed this encounter.   Belinda Fisher, PA-C 10/14/19 [phone]

## 2019-10-14 NOTE — Discharge Instructions (Signed)
Start nystatin as directed. Follow up with dentist for further treatment and evaluation. If experiencing swelling of the throat, trouble breathing, trouble swallowing, leaning forward to breath, drooling, go to the emergency department for further evaluation.

## 2019-10-14 NOTE — ED Triage Notes (Signed)
Pt c/o a white area with white secretion to inside of mouth rt lower gum line x4 days. States can't see dentist til 2wks.

## 2019-12-01 DIAGNOSIS — R790 Abnormal level of blood mineral: Secondary | ICD-10-CM

## 2019-12-01 HISTORY — DX: Abnormal level of blood mineral: R79.0

## 2019-12-09 ENCOUNTER — Ambulatory Visit (INDEPENDENT_AMBULATORY_CARE_PROVIDER_SITE_OTHER): Payer: No Typology Code available for payment source | Admitting: Family Medicine

## 2019-12-09 ENCOUNTER — Other Ambulatory Visit: Payer: Self-pay

## 2019-12-09 ENCOUNTER — Encounter: Payer: Self-pay | Admitting: Family Medicine

## 2019-12-09 VITALS — BP 120/73 | HR 82 | Temp 98.3°F | Resp 17 | Ht 67.0 in | Wt 229.0 lb

## 2019-12-09 DIAGNOSIS — F419 Anxiety disorder, unspecified: Secondary | ICD-10-CM | POA: Diagnosis not present

## 2019-12-09 DIAGNOSIS — Z760 Encounter for issue of repeat prescription: Secondary | ICD-10-CM

## 2019-12-09 DIAGNOSIS — Z0000 Encounter for general adult medical examination without abnormal findings: Secondary | ICD-10-CM | POA: Diagnosis not present

## 2019-12-09 DIAGNOSIS — Z09 Encounter for follow-up examination after completed treatment for conditions other than malignant neoplasm: Secondary | ICD-10-CM

## 2019-12-09 DIAGNOSIS — G4700 Insomnia, unspecified: Secondary | ICD-10-CM | POA: Diagnosis not present

## 2019-12-09 DIAGNOSIS — R69 Illness, unspecified: Secondary | ICD-10-CM | POA: Diagnosis not present

## 2019-12-09 MED ORDER — TRAZODONE HCL 50 MG PO TABS: ORAL_TABLET | ORAL | 3 refills | Status: DC

## 2019-12-09 NOTE — Progress Notes (Signed)
[facility]   Established Patient Office Visit  Subjective:  Patient ID: [name], female    DOB: Aug 16, 1980  Age: 39 y.o. MRN: [id number]  CC:  Chief Complaint  Patient presents with  . Follow-up    Pt states she would like a STD testing but  she has no discharge, odor, or tinging sensation or burning when she urinate.  . Leg Pain    [id number]     HPI [name] is a 39 year old female who presents for Follow Up today.    Patient Active Problem List   Diagnosis Date Noted  . Nonintractable headache 04/10/2019  . Anxiety 04/10/2019  . Insomnia 04/10/2019    Current Status: Since her last office visit, she is doing well with no complaints. She denies fevers, chills, fatigue, recent infections, weight loss, and night sweats. She has not had any headaches, visual changes, dizziness, and falls. No chest pain, heart palpitations, cough and shortness of breath reported. Denies GI problems such as nausea, vomiting, diarrhea, and constipation. She has no reports of blood in stools, dysuria and hematuria. No depression or anxiety reported today. She is taking all medications as prescribed. She denies pain today.   Past Medical History:  Diagnosis Date  . Anemia   . Anxiety 03/2019  . Insomnia 04/07/2019  . Kidney stone   . Migraine   . Vitamin D deficiency 03/2019    Past Surgical History:  Procedure Laterality Date  . CESAREAN SECTION    . TUBAL LIGATION Left June 2012  . WISDOM TOOTH EXTRACTION      Family History  Problem Relation Age of Onset  . Diabetes Mother   . Hypertension Mother   . Diabetes Maternal Grandmother   . Hypertension Maternal Grandmother   . Cancer Maternal Grandmother   . Diabetes Maternal Grandfather   . Hypertension Maternal Grandfather     Social History   Socioeconomic History  . Marital status: Single    Spouse name: Not on file  . Number of children: Not on file  . Years of  education: Not on file  . Highest education level: Not on file  Occupational History  . Not on file  Tobacco Use  . Smoking status: Never Smoker  . Smokeless tobacco: Never Used  Vaping Use  . Vaping Use: Never used  Substance and Sexual Activity  . Alcohol use: No  . Drug use: No  . Sexual activity: Yes    Birth control/protection: Condom  Other Topics Concern  . Not on file  Social History Narrative  . Not on file   Social Determinants of Health   Financial Resource Strain:   . Difficulty of Paying Living Expenses: Not on file  Food Insecurity:   . Worried About Programme researcher, broadcasting/film/videounning Out of Food in the Last Year: Not on file  . Ran Out of Food in the Last Year: Not on file  Transportation Needs:   . Lack of Transportation (Medical): Not on file  . Lack of Transportation (Non-Medical): Not on file  Physical Activity:   . Days of Exercise per Week: Not on file  . Minutes of Exercise per Session: Not on file  Stress:   . Feeling of Stress : Not on file  Social Connections:   . Frequency of Communication with Friends and Family: Not on file  . Frequency of Social Gatherings with Friends and Family: Not on file  . Attends Religious  Services: Not on file  . Active Member of Clubs or Organizations: Not on file  . Attends Banker Meetings: Not on file  . Marital Status: Not on file  Intimate Partner Violence:   . Fear of Current or Ex-Partner: Not on file  . Emotionally Abused: Not on file  . Physically Abused: Not on file  . Sexually Abused: Not on file    Outpatient Medications Prior to Visit  Medication Sig Dispense Refill  . traZODone (DESYREL) 50 MG tablet TAKE 1 TABLET BY MOUTH EVERYDAY AT BEDTIME 90 tablet 2  . nystatin (MYCOSTATIN) 100000 UNIT/ML suspension Take 5 mLs (500,000 Units total) by mouth 4 (four) times daily. (Patient not taking: Reported on 12/09/2019) 60 mL 0   No facility-administered medications prior to visit.    No Known  Allergies  ROS Review of Systems  Constitutional: Negative.   HENT: Negative.   Eyes: Negative.   Respiratory: Negative.   Cardiovascular: Negative.   Gastrointestinal: Negative.   Endocrine: Negative.   Genitourinary: Negative.   Musculoskeletal: Negative.   Skin: Negative.   Allergic/Immunologic: Negative.   Neurological: Positive for dizziness (occasional ) and headaches (occasional ).  Hematological: Negative.   Psychiatric/Behavioral: Positive for sleep disturbance (insomnia).      Objective:    Physical Exam Vitals and nursing note reviewed.  Constitutional:      Appearance: Normal appearance.  HENT:     Head: Normocephalic and atraumatic.     Nose: Nose normal.     Mouth/Throat:     Mouth: Mucous membranes are moist.     Pharynx: Oropharynx is clear.  Cardiovascular:     Rate and Rhythm: Normal rate and regular rhythm.     Pulses: Normal pulses.     Heart sounds: Normal heart sounds.  Pulmonary:     Effort: Pulmonary effort is normal.     Breath sounds: Normal breath sounds.  Abdominal:     General: Bowel sounds are normal. There is distension.     Palpations: Abdomen is soft.  Musculoskeletal:        General: Normal range of motion.     Cervical back: Normal range of motion and neck supple.  Skin:    General: Skin is warm and dry.  Neurological:     General: No focal deficit present.     Mental Status: She is alert and oriented to person, place, and time.  Psychiatric:        Mood and Affect: Mood normal.        Behavior: Behavior normal.        Thought Content: Thought content normal.        Judgment: Judgment normal.     BP 120/73 (BP Location: Left Arm, Patient Position: Sitting, Cuff Size: Normal)   Pulse 82   Temp 98.3 F (36.8 C)   Resp 17   Ht 5\' 7"  (1.702 m)   Wt 229 lb (103.9 kg)   LMP 11/28/2019 (Exact Date)   SpO2 99%   BMI 35.87 kg/m  Wt Readings from Last 3 Encounters:  12/09/19 229 lb (103.9 kg)  04/07/19 226 lb 3.2 oz  (102.6 kg)  02/14/18 213 lb (96.6 kg)     Health Maintenance Due  Topic Date Due  . Hepatitis C Screening  Never done  . HIV Screening  Never done  . TETANUS/TDAP  Never done  . PAP SMEAR-Modifier  Never done  . INFLUENZA VACCINE  Never done    There are  no preventive care reminders to display for this patient.  Lab Results  Component Value Date   TSH 0.685 04/07/2019   Lab Results  Component Value Date   WBC 8.4 04/07/2019   HGB 12.8 04/07/2019   HCT 42.8 04/07/2019   MCV 73 (L) 04/07/2019   PLT 379 04/07/2019   Lab Results  Component Value Date   NA 137 04/07/2019   K 4.3 04/07/2019   CO2 20 04/07/2019   GLUCOSE 97 04/07/2019   BUN 5 (L) 04/07/2019   CREATININE 0.77 04/07/2019   BILITOT 0.3 04/07/2019   ALKPHOS 56 04/07/2019   AST 15 04/07/2019   ALT 8 04/07/2019   PROT 7.2 04/07/2019   ALBUMIN 4.2 04/07/2019   CALCIUM 9.3 04/07/2019   ANIONGAP 8 09/25/2016   Lab Results  Component Value Date   CHOL 162 04/07/2019   Lab Results  Component Value Date   HDL 44 04/07/2019   Lab Results  Component Value Date   LDLCALC 103 (H) 04/07/2019   Lab Results  Component Value Date   TRIG 77 04/07/2019   Lab Results  Component Value Date   CHOLHDL 3.7 04/07/2019   Lab Results  Component Value Date   HGBA1C 7.1 (A) 04/07/2019   Assessment & Plan:   1. Insomnia, unspecified type Stable. Continue Trazodone as prescribed.   2. Anxiety - traZODone (DESYREL) 50 MG tablet; TAKE 1 TABLET BY MOUTH EVERYDAY AT BEDTIME  Dispense: 90 tablet; Refill: 3  3. Medication refil  4. Health care maintenance - CBC with Differential - Comprehensive metabolic panel - Chlamydia/Gonococcus/Trichomonas, NAA - TSH - Lipid Panel - Vitamin B12 - Vitamin D, 25-hydroxy - Iron and TIBC([facility])  5. Follow up She will follow up in 6 months.  Look for FMLA forms for continuous time off from 12/03/2019-12/09/2019 to be faxed to our office.  Meds ordered this  encounter  Medications  . traZODone (DESYREL) 50 MG tablet    Sig: TAKE 1 TABLET BY MOUTH EVERYDAY AT BEDTIME    Dispense:  90 tablet    Refill:  3    Orders Placed This Encounter  Procedures  . Chlamydia/Gonococcus/Trichomonas, NAA  . CBC with Differential  . Comprehensive metabolic panel  . TSH  . Lipid Panel  . Vitamin B12  . Vitamin D, 25-hydroxy  . Iron and TIBC([facility])    Referral Orders  No referral(s) requested today    Raliegh Ip,  MSN, FNP-BC [facility] [address] [place], [place] [address] [phone] fax   Problem List Items Addressed This Visit      Other   Anxiety   Relevant Medications   traZODone (DESYREL) 50 MG tablet   Insomnia - Primary    Other Visit Diagnoses    Medication refill       Health care maintenance       Relevant Orders   CBC with Differential   Comprehensive metabolic panel   Chlamydia/Gonococcus/Trichomonas, NAA   TSH   Lipid Panel   Vitamin B12   Vitamin D, 25-hydroxy   Iron and TIBC([facility])   Follow up          Meds ordered this encounter  Medications  . traZODone (DESYREL) 50 MG tablet    Sig: TAKE 1 TABLET BY MOUTH EVERYDAY AT BEDTIME    Dispense:  90 tablet    Refill:  3    Follow-up: Return in about 6 months (around  06/07/2020).    Kallie Locks, FNP

## 2019-12-10 ENCOUNTER — Other Ambulatory Visit: Payer: Self-pay | Admitting: Family Medicine

## 2019-12-10 ENCOUNTER — Encounter: Payer: Self-pay | Admitting: Family Medicine

## 2019-12-10 DIAGNOSIS — E559 Vitamin D deficiency, unspecified: Secondary | ICD-10-CM

## 2019-12-10 DIAGNOSIS — R790 Abnormal level of blood mineral: Secondary | ICD-10-CM

## 2019-12-10 LAB — CBC WITH DIFFERENTIAL/PLATELET
Basophils Absolute: 0.1 10*3/uL (ref 0.0–0.2)
Basos: 1 %
EOS (ABSOLUTE): 0.2 10*3/uL (ref 0.0–0.4)
Eos: 3 %
Hematocrit: 42.2 % (ref 34.0–46.6)
Hemoglobin: 12.7 g/dL (ref 11.1–15.9)
Immature Grans (Abs): 0 10*3/uL (ref 0.0–0.1)
Immature Granulocytes: 0 %
Lymphocytes Absolute: 3.9 10*3/uL — ABNORMAL HIGH (ref 0.7–3.1)
Lymphs: 46 %
MCH: 21.8 pg — ABNORMAL LOW (ref 26.6–33.0)
MCHC: 30.1 g/dL — ABNORMAL LOW (ref 31.5–35.7)
MCV: 73 fL — ABNORMAL LOW (ref 79–97)
Monocytes Absolute: 0.6 10*3/uL (ref 0.1–0.9)
Monocytes: 6 %
Neutrophils Absolute: 3.8 10*3/uL (ref 1.4–7.0)
Neutrophils: 44 %
Platelets: 392 10*3/uL (ref 150–450)
RBC: 5.82 x10E6/uL — ABNORMAL HIGH (ref 3.77–5.28)
RDW: 13.8 % (ref 11.7–15.4)
WBC: 8.6 10*3/uL (ref 3.4–10.8)

## 2019-12-10 LAB — COMPREHENSIVE METABOLIC PANEL WITH GFR
ALT: 12 [IU]/L (ref 0–32)
AST: 12 [IU]/L (ref 0–40)
Albumin/Globulin Ratio: 1.4 (ref 1.2–2.2)
Albumin: 4.3 g/dL (ref 3.8–4.8)
Alkaline Phosphatase: 65 [IU]/L (ref 44–121)
BUN/Creatinine Ratio: 7 — ABNORMAL LOW (ref 9–23)
BUN: 6 mg/dL (ref 6–24)
Bilirubin Total: 0.4 mg/dL (ref 0.0–1.2)
CO2: 25 mmol/L (ref 20–29)
Calcium: 9.4 mg/dL (ref 8.7–10.2)
Chloride: 99 mmol/L (ref 96–106)
Creatinine, Ser: 0.82 mg/dL (ref 0.57–1.00)
GFR calc Af Amer: 104 mL/min/{1.73_m2}
GFR calc non Af Amer: 90 mL/min/{1.73_m2}
Globulin, Total: 3.1 g/dL (ref 1.5–4.5)
Glucose: 145 mg/dL — ABNORMAL HIGH (ref 65–99)
Potassium: 4.6 mmol/L (ref 3.5–5.2)
Sodium: 134 mmol/L (ref 134–144)
Total Protein: 7.4 g/dL (ref 6.0–8.5)

## 2019-12-10 LAB — TSH: TSH: 1.21 u[IU]/mL (ref 0.450–4.500)

## 2019-12-10 LAB — VITAMIN B12: Vitamin B-12: 656 pg/mL (ref 232–1245)

## 2019-12-10 LAB — LIPID PANEL
Chol/HDL Ratio: 4.6 ratio — ABNORMAL HIGH (ref 0.0–4.4)
Cholesterol, Total: 182 mg/dL (ref 100–199)
HDL: 40 mg/dL (ref >39)
LDL Chol Calc (NIH): 126 mg/dL — ABNORMAL HIGH (ref 0–99)
Triglycerides: 89 mg/dL (ref 0–149)
VLDL Cholesterol Cal: 16 mg/dL (ref 5–40)

## 2019-12-10 LAB — IRON AND TIBC
Iron Saturation: 13 % — ABNORMAL LOW (ref 15–55)
Iron: 38 ug/dL (ref 27–159)
Total Iron Binding Capacity: 302 ug/dL (ref 250–450)
UIBC: 264 ug/dL (ref 131–425)

## 2019-12-10 LAB — VITAMIN D 25 HYDROXY (VIT D DEFICIENCY, FRACTURES): Vit D, 25-Hydroxy: 6.2 ng/mL — ABNORMAL LOW (ref 30.0–100.0)

## 2019-12-10 MED ORDER — VITAMIN D (ERGOCALCIFEROL) 1.25 MG (50000 UNIT) PO CAPS: 50000.0000 [IU] | ORAL_CAPSULE | ORAL | 6 refills | Status: DC

## 2019-12-10 MED ORDER — FERROUS SULFATE 325 (65 FE) MG PO TBEC: 325.0000 mg | DELAYED_RELEASE_TABLET | Freq: Every day | ORAL | 11 refills | Status: DC

## 2019-12-10 NOTE — Progress Notes (Signed)
Pt was called to discuss her lab results. Pt states she understood and will keep her f/u appt. Pt asked about her STI test. I did let pt know that it can take up to 24-48 hrs until her results come back and will give her a call.

## 2019-12-12 LAB — CHLAMYDIA/GONOCOCCUS/TRICHOMONAS, NAA
Chlamydia by NAA: NEGATIVE
Gonococcus by NAA: NEGATIVE
Trich vag by NAA: POSITIVE — AB

## 2019-12-15 ENCOUNTER — Other Ambulatory Visit: Payer: Self-pay | Admitting: Family Medicine

## 2019-12-15 DIAGNOSIS — A599 Trichomoniasis, unspecified: Secondary | ICD-10-CM

## 2019-12-15 MED ORDER — METRONIDAZOLE 500 MG PO TABS: 2000.0000 mg | ORAL_TABLET | Freq: Once | ORAL | 0 refills | Status: AC

## 2019-12-30 ENCOUNTER — Ambulatory Visit: Payer: No Typology Code available for payment source | Admitting: Family Medicine

## 2020-01-20 ENCOUNTER — Emergency Department (HOSPITAL_COMMUNITY)
Admission: EM | Admit: 2020-01-20 | Discharge: 2020-01-20 | Disposition: A | Discharge status: Home / Self Care | Payer: No Typology Code available for payment source | Attending: Emergency Medicine | Admitting: Emergency Medicine

## 2020-01-20 ENCOUNTER — Other Ambulatory Visit: Payer: Self-pay

## 2020-01-20 DIAGNOSIS — M791 Myalgia, unspecified site: Secondary | ICD-10-CM | POA: Insufficient documentation

## 2020-01-20 DIAGNOSIS — R519 Headache, unspecified: Secondary | ICD-10-CM | POA: Insufficient documentation

## 2020-01-20 DIAGNOSIS — R6883 Chills (without fever): Secondary | ICD-10-CM | POA: Diagnosis not present

## 2020-01-20 DIAGNOSIS — Z20822 Contact with and (suspected) exposure to covid-19: Secondary | ICD-10-CM | POA: Insufficient documentation

## 2020-01-20 LAB — RESP PANEL BY RT-PCR (FLU A&B, COVID) ARPGX2
Influenza A by PCR: NEGATIVE
Influenza B by PCR: NEGATIVE
SARS Coronavirus 2 by RT PCR: NEGATIVE

## 2020-01-20 NOTE — ED Provider Notes (Signed)
[facility] EMERGENCY DEPARTMENT Provider Note   CSN: [id number] Arrival date & time: 01/20/20  1834     History Chief Complaint  Patient presents with  . Chills  . Headache  . Generalized Body Aches         [name] is a 39 y.o. female.  39 year old female with past medical history below who presents with concern for COVID-19.  Patient reports 3 days of headaches, chills, and body aches.  No significant cough, sore throat, vomiting, diarrhea, or fevers.  She has been around a family member who tested positive for Covid recently.  Daughters are here with similar symptoms.  She denies any breathing problems or problems tolerating fluids.  She is fully vaccinated against COVID-19.  The history is provided by the patient.       Past Medical History:  Diagnosis Date  . Anemia   . Anxiety 03/2019  . Insomnia 04/07/2019  . Kidney stone   . Low iron stores 12/2019  . Migraine   . Vitamin D deficiency 03/2019    Patient Active Problem List   Diagnosis Date Noted  . Nonintractable headache 04/10/2019  . Anxiety 04/10/2019  . Insomnia 04/10/2019    Past Surgical History:  Procedure Laterality Date  . CESAREAN SECTION    . TUBAL LIGATION Left June 2012  . WISDOM TOOTH EXTRACTION       OB History    Gravida  6   Para  3   Term  3   Preterm      AB  3   Living  3     SAB  1   IAB  1   Ectopic  1   Multiple      Live Births              Family History  Problem Relation Age of Onset  . Diabetes Mother   . Hypertension Mother   . Diabetes Maternal Grandmother   . Hypertension Maternal Grandmother   . Cancer Maternal Grandmother   . Diabetes Maternal Grandfather   . Hypertension Maternal Grandfather     Social History   Tobacco Use  . Smoking status: Never Smoker  . Smokeless tobacco: Never Used  Vaping Use  . Vaping Use: Never used  Substance Use Topics  . Alcohol use: No  . Drug use: No    Home  Medications Prior to Admission medications   Medication Sig Start Date End Date Taking? Authorizing Provider  ferrous sulfate 325 (65 FE) MG EC tablet Take 1 tablet (325 mg total) by mouth daily. 12/10/19 12/04/20  Kallie Locks, FNP  nystatin (MYCOSTATIN) 100000 UNIT/ML suspension Take 5 mLs (500,000 Units total) by mouth 4 (four) times daily. Patient not taking: Reported on 12/09/2019 10/14/19   Belinda Fisher, PA-C  traZODone (DESYREL) 50 MG tablet TAKE 1 TABLET BY MOUTH EVERYDAY AT BEDTIME 12/09/19   Kallie Locks, FNP  Vitamin D, Ergocalciferol, (DRISDOL) 1.25 MG (50000 UNIT) CAPS capsule Take 1 capsule (50,000 Units total) by mouth every 7 (seven) days. 12/10/19   Kallie Locks, FNP  topiramate (TOPAMAX) 25 MG tablet TAKE 1 TABLET BY MOUTH TWICE A DAY 07/01/19 09/24/19  Kallie Locks, FNP    Allergies    Patient has no known allergies.  Review of Systems   Review of Systems All other systems reviewed and are negative except that which was mentioned in HPI  Physical Exam Updated Vital Signs BP Marland Kitchen)  145/87 (BP Location: Right Arm)   Pulse 92   Temp 98.5 F (36.9 C) (Oral)   Resp 16   SpO2 100%   Physical Exam Vitals and nursing note reviewed.  Constitutional:      General: She is not in acute distress.    Appearance: She is well-developed and well-nourished.  HENT:     Head: Normocephalic and atraumatic.  Eyes:     Conjunctiva/sclera: Conjunctivae normal.  Cardiovascular:     Rate and Rhythm: Normal rate and regular rhythm.     Heart sounds: Normal heart sounds. No murmur heard.   Pulmonary:     Effort: Pulmonary effort is normal.     Breath sounds: Normal breath sounds.  Abdominal:     General: There is no distension.     Palpations: Abdomen is soft.     Tenderness: There is no abdominal tenderness.  Musculoskeletal:     Cervical back: Neck supple.  Skin:    General: Skin is warm and dry.  Neurological:     Mental Status: She is alert and oriented to  person, place, and time.  Psychiatric:        Mood and Affect: Mood and affect and mood normal.        Behavior: Behavior normal.        Judgment: Judgment normal.     ED Results / Procedures / Treatments   Labs (all labs ordered are listed, but only abnormal results are displayed) Labs Reviewed  RESP PANEL BY RT-PCR (FLU A&B, COVID) [id number]    EKG None  Radiology No results found.  Procedures Procedures (including critical care time)  Medications Ordered in ED Medications - No data to display  ED Course  I have reviewed the triage vital signs and the nursing notes.  Pertinent labs that were available during my care of the patient were reviewed by me and considered in my medical decision making (see chart for details).    MDM Rules/Calculators/A&P                          Well-appearing on exam, reassuring vital signs.  Recommended Covid testing and discussed what to do regarding test results.  Discussed abortive measures for symptoms and extensively reviewed return precautions.  She voiced understanding.  [name] was evaluated in Emergency Department on 01/20/2020 for the symptoms described in the history of present illness. She was evaluated in the context of the global COVID-19 pandemic, which necessitated consideration that the patient might be at risk for infection with the SARS-CoV-2 virus that causes COVID-19. Institutional protocols and algorithms that pertain to the evaluation of patients at risk for COVID-19 are in a state of rapid change based on information released by regulatory bodies including the CDC and federal and state organizations. These policies and algorithms were followed during the patient's care in the ED.  Final Clinical Impression(s) / ED Diagnoses Final diagnoses:  Suspected COVID-19 virus infection    Rx / DC Orders ED Discharge Orders    None       Shaquasia Caponigro, [place]rose [place], MD 01/20/20 2052

## 2020-01-20 NOTE — ED Triage Notes (Signed)
Pt lives with a person that was covid positive today. Pt has had s/s of covid for 3 days. She is here to be tested.

## 2020-01-21 ENCOUNTER — Telehealth (HOSPITAL_COMMUNITY): Payer: Self-pay

## 2020-01-30 ENCOUNTER — Ambulatory Visit: Payer: No Typology Code available for payment source | Admitting: Family Medicine

## 2020-02-27 ENCOUNTER — Encounter: Payer: No Typology Code available for payment source | Admitting: Family Medicine

## 2020-04-06 DIAGNOSIS — H5213 Myopia, bilateral: Secondary | ICD-10-CM | POA: Diagnosis not present

## 2020-04-06 DIAGNOSIS — Z0100 Encounter for examination of eyes and vision without abnormal findings: Secondary | ICD-10-CM | POA: Diagnosis not present

## 2020-04-14 ENCOUNTER — Other Ambulatory Visit: Payer: Self-pay

## 2020-04-14 ENCOUNTER — Ambulatory Visit
Admission: EM | Admit: 2020-04-14 | Discharge: 2020-04-14 | Disposition: A | Discharge status: Home / Self Care | Payer: No Typology Code available for payment source | Attending: Family Medicine | Admitting: Family Medicine

## 2020-04-14 ENCOUNTER — Ambulatory Visit (INDEPENDENT_AMBULATORY_CARE_PROVIDER_SITE_OTHER): Payer: No Typology Code available for payment source

## 2020-04-14 DIAGNOSIS — W19XXXA Unspecified fall, initial encounter: Secondary | ICD-10-CM | POA: Diagnosis not present

## 2020-04-14 DIAGNOSIS — M25532 Pain in left wrist: Secondary | ICD-10-CM | POA: Diagnosis not present

## 2020-04-14 MED ORDER — IBUPROFEN 800 MG PO TABS: 800.0000 mg | ORAL_TABLET | Freq: Three times a day (TID) | ORAL | 0 refills | Status: DC | PRN

## 2020-04-14 NOTE — ED Provider Notes (Signed)
[facility]    CSN: [id number] Arrival date & time: 04/14/20  1219      History   Chief Complaint Chief Complaint  Patient presents with  . Wrist Pain    HPI [name] is a 40 y.o. female.   Patient presenting today with 2-day history of left medial wrist pain after a fall several days ago where she caught herself on her hand.  States there is some swelling in the area and she is unable to rotate the hand without significant pain.  Has been wearing a brace and taking over-the-counter pain relievers without much relief.  Denies discoloration, numbness, tingling, diffuse pulling down into the hand.     Past Medical History:  Diagnosis Date  . Anemia   . Anxiety 03/2019  . Insomnia 04/07/2019  . Kidney stone   . Low iron stores 12/2019  . Migraine   . Vitamin D deficiency 03/2019    Patient Active Problem List   Diagnosis Date Noted  . Nonintractable headache 04/10/2019  . Anxiety 04/10/2019  . Insomnia 04/10/2019    Past Surgical History:  Procedure Laterality Date  . CESAREAN SECTION    . TUBAL LIGATION Left June 2012  . WISDOM TOOTH EXTRACTION      OB History    Gravida  6   Para  3   Term  3   Preterm      AB  3   Living  3     SAB  1   IAB  1   Ectopic  1   Multiple      Live Births               Home Medications    Prior to Admission medications   Medication Sig Start Date End Date Taking? Authorizing Provider  ibuprofen (ADVIL) 800 MG tablet Take 1 tablet (800 mg total) by mouth every 8 (eight) hours as needed. 04/14/20  Yes Particia Nearing, PA-C  ferrous sulfate 325 (65 FE) MG EC tablet Take 1 tablet (325 mg total) by mouth daily. 12/10/19 12/04/20  Kallie Locks, FNP  nystatin (MYCOSTATIN) 100000 UNIT/ML suspension Take 5 mLs (500,000 Units total) by mouth 4 (four) times daily. Patient not taking: Reported on 12/09/2019 10/14/19   Belinda Fisher, PA-C  traZODone (DESYREL) 50 MG tablet TAKE 1 TABLET BY  MOUTH EVERYDAY AT BEDTIME 12/09/19   Kallie Locks, FNP  Vitamin D, Ergocalciferol, (DRISDOL) 1.25 MG (50000 UNIT) CAPS capsule Take 1 capsule (50,000 Units total) by mouth every 7 (seven) days. 12/10/19   Kallie Locks, FNP  topiramate (TOPAMAX) 25 MG tablet TAKE 1 TABLET BY MOUTH TWICE A DAY 07/01/19 09/24/19  Kallie Locks, FNP    Family History Family History  Problem Relation Age of Onset  . Diabetes Mother   . Hypertension Mother   . Diabetes Maternal Grandmother   . Hypertension Maternal Grandmother   . Cancer Maternal Grandmother   . Diabetes Maternal Grandfather   . Hypertension Maternal Grandfather     Social History Social History   Tobacco Use  . Smoking status: Never Smoker  . Smokeless tobacco: Never Used  Vaping Use  . Vaping Use: Never used  Substance Use Topics  . Alcohol use: No  . Drug use: No     Allergies   Patient has no known allergies.   Review of Systems Review of Systems Per HPI  Physical Exam Triage Vital Signs ED Triage Vitals  Enc Vitals Group     BP 04/14/20 1241 121/61     Pulse Rate 04/14/20 1241 78     Resp 04/14/20 1241 20     Temp 04/14/20 1241 98.7 F (37.1 C)     Temp src --      SpO2 04/14/20 1241 99 %     Weight --      Height --      Head Circumference --      Peak Flow --      Pain Score 04/14/20 1239 10     Pain Loc --      Pain Edu? --      Excl. in GC? --    No data found.  Updated Vital Signs BP 121/61   Pulse 78   Temp 98.7 F (37.1 C)   Resp 20   LMP 04/05/2020   SpO2 99%   Visual Acuity Right Eye Distance:   Left Eye Distance:   Bilateral Distance:    Right Eye Near:   Left Eye Near:    Bilateral Near:     Physical Exam Vitals and nursing note reviewed.  Constitutional:      Appearance: Normal appearance. She is not ill-appearing.  HENT:     Head: Atraumatic.  Eyes:     Extraocular Movements: Extraocular movements intact.     Conjunctiva/sclera: Conjunctivae normal.   Cardiovascular:     Rate and Rhythm: Normal rate and regular rhythm.     Heart sounds: Normal heart sounds.  Pulmonary:     Effort: Pulmonary effort is normal.     Breath sounds: Normal breath sounds.  Musculoskeletal:        General: Swelling and tenderness present. Normal range of motion.     Cervical back: Normal range of motion and neck supple.     Comments: tender to palpation left ulnar aspect of wrist and forearm  Able to twist forearm and move all fingers during exam  Skin:    General: Skin is warm and dry.  Neurological:     Mental Status: She is alert and oriented to person, place, and time.     Comments: Left upper extremity neurovascularly intact  Psychiatric:        Mood and Affect: Mood normal.        Thought Content: Thought content normal.        Judgment: Judgment normal.      UC Treatments / Results  Labs (all labs ordered are listed, but only abnormal results are displayed) Labs Reviewed - No data to display  EKG   Radiology DG Wrist Complete Left  Result Date: 04/14/2020 CLINICAL DATA:  Pain following fall EXAM: LEFT WRIST - COMPLETE 3+ VIEW COMPARISON:  None. FINDINGS: Frontal, oblique, lateral, and ulnar deviation images obtained. No fracture or dislocation. Joint spaces appear normal. No erosive change. IMPRESSION: No fracture or dislocation.  No appreciable arthropathy. Electronically Signed   By: Bretta Bang III M.D.   On: 04/14/2020 13:19    Procedures Procedures (including critical care time)  Medications Ordered in UC Medications - No data to display  Initial Impression / Assessment and Plan / UC Course  I have reviewed the triage vital signs and the nursing notes.  Pertinent labs & imaging results that were available during my care of the patient were reviewed by me and considered in my medical decision making (see chart for details).     Left wrist x-ray negative for bony abnormality.  RICE  protocol reviewed, ibuprofen sent in,  work note given.  Follow-up with sports medicine if not fully resolving.  Final Clinical Impressions(s) / UC Diagnoses   Final diagnoses:  Left wrist pain   Discharge Instructions   None    ED Prescriptions    Medication Sig Dispense Auth. Provider   ibuprofen (ADVIL) 800 MG tablet Take 1 tablet (800 mg total) by mouth every 8 (eight) hours as needed. 30 tablet Particia Nearing, [place]     PDMP not reviewed this encounter.   Particia Nearing, [place] 04/14/20 1336

## 2020-04-14 NOTE — ED Triage Notes (Signed)
Pt presents with left wrist injury from fall on Sunday

## 2020-05-20 ENCOUNTER — Other Ambulatory Visit: Payer: Self-pay

## 2020-05-20 ENCOUNTER — Ambulatory Visit (INDEPENDENT_AMBULATORY_CARE_PROVIDER_SITE_OTHER): Payer: No Typology Code available for payment source

## 2020-05-20 ENCOUNTER — Ambulatory Visit
Admission: EM | Admit: 2020-05-20 | Discharge: 2020-05-20 | Disposition: A | Discharge status: Home / Self Care | Payer: No Typology Code available for payment source | Attending: Emergency Medicine | Admitting: Emergency Medicine

## 2020-05-20 DIAGNOSIS — M25571 Pain in right ankle and joints of right foot: Secondary | ICD-10-CM

## 2020-05-20 DIAGNOSIS — M25572 Pain in left ankle and joints of left foot: Secondary | ICD-10-CM | POA: Diagnosis not present

## 2020-05-20 DIAGNOSIS — W19XXXA Unspecified fall, initial encounter: Secondary | ICD-10-CM | POA: Diagnosis not present

## 2020-05-20 DIAGNOSIS — S93402A Sprain of unspecified ligament of left ankle, initial encounter: Secondary | ICD-10-CM | POA: Diagnosis not present

## 2020-05-20 DIAGNOSIS — S93401A Sprain of unspecified ligament of right ankle, initial encounter: Secondary | ICD-10-CM

## 2020-05-20 MED ORDER — IBUPROFEN 800 MG PO TABS: 800.0000 mg | ORAL_TABLET | Freq: Three times a day (TID) | ORAL | 0 refills | Status: DC

## 2020-05-20 NOTE — ED Provider Notes (Signed)
[facility]    CSN: [id number] Arrival date & time: 05/20/20  1243      History   Chief Complaint Chief Complaint  Patient presents with  . Ankle Pain    HPI [name] is a 40 y.o. female history of migraines, presenting today for evaluation of bilateral ankle pain.  Reports falling down stairs 3 weeks ago, rolled left ankle and hit right ankle on stairs.  Initially was evaluated and had imaging of wrist, but since over the past 1 to 2 weeks she has had worsening pain in both of her ankles, left worse than right.  Denies history of prior fractures.  HPI  Past Medical History:  Diagnosis Date  . Anemia   . Anxiety 03/2019  . Insomnia 04/07/2019  . Kidney stone   . Low iron stores 12/2019  . Migraine   . Vitamin D deficiency 03/2019    Patient Active Problem List   Diagnosis Date Noted  . Nonintractable headache 04/10/2019  . Anxiety 04/10/2019  . Insomnia 04/10/2019    Past Surgical History:  Procedure Laterality Date  . CESAREAN SECTION    . TUBAL LIGATION Left June 2012  . WISDOM TOOTH EXTRACTION      OB History    Gravida  6   Para  3   Term  3   Preterm      AB  3   Living  3     SAB  1   IAB  1   Ectopic  1   Multiple      Live Births               Home Medications    Prior to Admission medications   Medication Sig Start Date End Date Taking? Authorizing Provider  ibuprofen (ADVIL) 800 MG tablet Take 1 tablet (800 mg total) by mouth 3 (three) times daily. 05/20/20  Yes Shekinah Pitones C, PA-C  ferrous sulfate 325 (65 FE) MG EC tablet Take 1 tablet (325 mg total) by mouth daily. 12/10/19 12/04/20  Kallie Locks, FNP  nystatin (MYCOSTATIN) 100000 UNIT/ML suspension Take 5 mLs (500,000 Units total) by mouth 4 (four) times daily. Patient not taking: Reported on 12/09/2019 10/14/19   Belinda Fisher, PA-C  traZODone (DESYREL) 50 MG tablet TAKE 1 TABLET BY MOUTH EVERYDAY AT BEDTIME 12/09/19   Kallie Locks, FNP   Vitamin D, Ergocalciferol, (DRISDOL) 1.25 MG (50000 UNIT) CAPS capsule Take 1 capsule (50,000 Units total) by mouth every 7 (seven) days. 12/10/19   Kallie Locks, FNP  topiramate (TOPAMAX) 25 MG tablet TAKE 1 TABLET BY MOUTH TWICE A DAY 07/01/19 09/24/19  Kallie Locks, FNP    Family History Family History  Problem Relation Age of Onset  . Diabetes Mother   . Hypertension Mother   . Diabetes Maternal Grandmother   . Hypertension Maternal Grandmother   . Cancer Maternal Grandmother   . Diabetes Maternal Grandfather   . Hypertension Maternal Grandfather   . Healthy Father     Social History Social History   Tobacco Use  . Smoking status: Never Smoker  . Smokeless tobacco: Never Used  Vaping Use  . Vaping Use: Never used  Substance Use Topics  . Alcohol use: No  . Drug use: No     Allergies   Patient has no known allergies.   Review of Systems Review of Systems  Constitutional: Negative for fatigue and fever.  Eyes: Negative for visual disturbance.  Respiratory: Negative for shortness of breath.   Cardiovascular: Negative for chest pain.  Gastrointestinal: Negative for abdominal pain, nausea and vomiting.  Musculoskeletal: Positive for arthralgias and gait problem. Negative for joint swelling.  Skin: Negative for color change, rash and wound.  Neurological: Negative for dizziness, weakness, light-headedness and headaches.     Physical Exam Triage Vital Signs ED Triage Vitals  Enc Vitals Group     BP 05/20/20 1252 112/80     Pulse Rate 05/20/20 1252 79     Resp 05/20/20 1252 19     Temp 05/20/20 1252 97.8 F (36.6 C)     Temp src --      SpO2 05/20/20 1252 94 %     Weight --      Height --      Head Circumference --      Peak Flow --      Pain Score 05/20/20 1251 9     Pain Loc --      Pain Edu? --      Excl. in GC? --    No data found.  Updated Vital Signs BP 112/80   Pulse 79   Temp 97.8 F (36.6 C)   Resp 19   LMP 04/25/2020   SpO2  94%   Visual Acuity Right Eye Distance:   Left Eye Distance:   Bilateral Distance:    Right Eye Near:   Left Eye Near:    Bilateral Near:     Physical Exam Vitals and nursing note reviewed.  Constitutional:      Appearance: She is well-developed.     Comments: No acute distress  HENT:     Head: Normocephalic and atraumatic.     Nose: Nose normal.  Eyes:     Conjunctiva/sclera: Conjunctivae normal.  Cardiovascular:     Rate and Rhythm: Normal rate.  Pulmonary:     Effort: Pulmonary effort is normal. No respiratory distress.  Abdominal:     General: There is no distension.  Musculoskeletal:        General: Normal range of motion.     Cervical back: Neck supple.     Comments: Right ankle: No obvious swelling or deformity, tender to palpation to lateral malleolus, nontender throughout anterior ankle or dorsum of foot, nontender to medial aspect, dorsalis pedis 2+  Left ankle: No obvious swelling or deformity, tender to palpation to lateral malleolus, anterior ankle and throughout proximal dorsum of foot, dorsalis pedis 2+  Skin:    General: Skin is warm and dry.  Neurological:     Mental Status: She is alert and oriented to person, place, and time.      UC Treatments / Results  Labs (all labs ordered are listed, but only abnormal results are displayed) Labs Reviewed - No data to display  EKG   Radiology No results found.  Procedures Procedures (including critical care time)  Medications Ordered in UC Medications - No data to display  Initial Impression / Assessment and Plan / UC Course  I have reviewed the triage vital signs and the nursing notes.  Pertinent labs & imaging results that were available during my care of the patient were reviewed by me and considered in my medical decision making (see chart for details).     X-rays negative for acute fracture, bilateral medial malleoli with small rounded cortical densities, nontender in this area, seems less  likely chronic/old versus related to recent injury.  Placing an bilateral ASO's for support and recommending  continued anti-inflammatories ice and elevation, follow-up with sports medicine if persisting.  Discussed strict return precautions. Patient verbalized understanding and is agreeable with plan.  Final Clinical Impressions(s) / UC Diagnoses   Final diagnoses:  Sprain of right ankle, unspecified ligament, initial encounter  Sprain of left ankle, unspecified ligament, initial encounter     Discharge Instructions     Xrays normal Use anti-inflammatories for pain/swelling. You may take up to 800 mg Ibuprofen every 8 hours with food. You may supplement Ibuprofen with Tylenol [phone] mg every 8 hours.  Ice and elevate Ankle braces for extra support    ED Prescriptions    Medication Sig Dispense Auth. Provider   ibuprofen (ADVIL) 800 MG tablet Take 1 tablet (800 mg total) by mouth 3 (three) times daily. 21 tablet Candus Braud, [place] C, PA-C     PDMP not reviewed this encounter.   Lew Dawes, [place] 05/20/20 1436

## 2020-05-20 NOTE — ED Triage Notes (Signed)
Pt presents with complaints of bilateral ankle pain since fall three weeks ago. She came here initially for the fall to get her left wrist checked out. She has started having the pain in her ankles that is getting progressively worse.

## 2020-05-20 NOTE — Discharge Instructions (Addendum)
Xrays normal Use anti-inflammatories for pain/swelling. You may take up to 800 mg Ibuprofen every 8 hours with food. You may supplement Ibuprofen with Tylenol [phone] mg every 8 hours.  Ice and elevate Ankle braces for extra support

## 2020-06-18 ENCOUNTER — Encounter: Payer: No Typology Code available for payment source | Admitting: Nurse Practitioner

## 2020-08-13 ENCOUNTER — Encounter: Payer: Self-pay | Admitting: Nurse Practitioner

## 2020-08-13 ENCOUNTER — Other Ambulatory Visit: Payer: Self-pay

## 2020-08-13 ENCOUNTER — Ambulatory Visit (INDEPENDENT_AMBULATORY_CARE_PROVIDER_SITE_OTHER): Payer: 59 | Admitting: Nurse Practitioner

## 2020-08-13 VITALS — BP 116/58 | HR 85 | Temp 97.4°F | Ht 67.0 in | Wt 228.1 lb

## 2020-08-13 DIAGNOSIS — Z0000 Encounter for general adult medical examination without abnormal findings: Secondary | ICD-10-CM

## 2020-08-13 DIAGNOSIS — E1165 Type 2 diabetes mellitus with hyperglycemia: Secondary | ICD-10-CM

## 2020-08-13 DIAGNOSIS — Z13220 Encounter for screening for lipoid disorders: Secondary | ICD-10-CM | POA: Diagnosis not present

## 2020-08-13 DIAGNOSIS — Z130 Encounter for screening for diseases of the blood and blood-forming organs and certain disorders involving the immune mechanism: Secondary | ICD-10-CM | POA: Diagnosis not present

## 2020-08-13 DIAGNOSIS — E119 Type 2 diabetes mellitus without complications: Secondary | ICD-10-CM | POA: Diagnosis not present

## 2020-08-13 DIAGNOSIS — Z1329 Encounter for screening for other suspected endocrine disorder: Secondary | ICD-10-CM

## 2020-08-13 LAB — POCT URINALYSIS DIPSTICK
Glucose, UA: NEGATIVE
Ketones, UA: NEGATIVE
Nitrite, UA: NEGATIVE
Protein, UA: POSITIVE — AB
Spec Grav, UA: >=1.03 — AB (ref 1.010–1.025)
Urobilinogen, UA: 0.2 E.U./dL
pH, UA: 5.5 (ref 5.0–8.0)

## 2020-08-13 MED ORDER — METFORMIN HCL 500 MG PO TABS: ORAL_TABLET | ORAL | 3 refills | Status: DC

## 2020-08-13 MED ORDER — BLOOD GLUCOSE MONITOR KIT: PACK | 0 refills | Status: DC

## 2020-08-13 NOTE — Patient Instructions (Addendum)
Diabetes Mellitus and Nutrition, Adult When you have diabetes, or diabetes mellitus, it is very important to have healthy eating habits because your blood sugar (glucose) levels are greatly affected by what you eat and drink. Eating healthy foods in the right amounts, at about the same times every day, can help you: Control your blood glucose. Lower your risk of heart disease. Improve your blood pressure. Reach or maintain a healthy weight. What can affect my meal plan? Every person with diabetes is different, and each person has different needs for a meal plan. Your health care provider may recommend that you work with a dietitian to make a meal plan that is best for you. Your meal plan may vary depending on factors such as: The calories you need. The medicines you take. Your weight. Your blood glucose, blood pressure, and cholesterol levels. Your activity level. Other health conditions you have, such as heart or kidney disease. How do carbohydrates affect me? Carbohydrates, also called carbs, affect your blood glucose level more than any other type of food. Eating carbs naturally raises the amount of glucose in your blood. Carb counting is a method for keeping track of how many carbs you eat. Counting carbs is important to keep your blood glucose at a healthy level, especially if you use insulin or take certain oral diabetes medicines. It is important to know how many carbs you can safely have in each meal. This is different for every person. Your dietitian can help you calculate how many carbs you should have at each meal and for each snack. How does alcohol affect me? Alcohol can cause a sudden decrease in blood glucose (hypoglycemia), especially if you use insulin or take certain oral diabetes medicines. Hypoglycemia can be a life-threatening condition. Symptoms of hypoglycemia, such as sleepiness, dizziness, and confusion, are similar to symptoms of having too much alcohol. Do not drink  alcohol if: Your health care provider tells you not to drink. You are pregnant, may be pregnant, or are planning to become pregnant. If you drink alcohol: Do not drink on an empty stomach. Limit how much you use to: 0-1 drink a day for women. 0-2 drinks a day for men. Be aware of how much alcohol is in your drink. In the [place] one drink equals one 12 oz bottle of beer (355 mL), one 5 oz glass of wine (148 mL), or one 1 oz glass of hard liquor (44 mL). Keep yourself hydrated with water, diet soda, or unsweetened iced tea. Keep in mind that regular soda, juice, and other mixers may contain a lot of sugar and must be counted as carbs. What are tips for following this plan? Reading food labels Start by checking the serving size on the "Nutrition Facts" label of packaged foods and drinks. The amount of calories, carbs, fats, and other nutrients listed on the label is based on one serving of the item. Many items contain more than one serving per package. Check the total grams (g) of carbs in one serving. You can calculate the number of servings of carbs in one serving by dividing the total carbs by 15. For example, if a food has 30 g of total carbs per serving, it would be equal to 2 servings of carbs. Check the number of grams (g) of saturated fats and trans fats in one serving. Choose foods that have a low amount or none of these fats. Check the number of milligrams (mg) of salt (sodium) in one serving. Most people should limit   total sodium intake to less than 2,300 mg per day. Always check the nutrition information of foods labeled as "low-fat" or "nonfat." These foods may be higher in added sugar or refined carbs and should be avoided. Talk to your dietitian to identify your daily goals for nutrients listed on the label. Shopping Avoid buying canned, pre-made, or processed foods. These foods tend to be high in fat, sodium, and added sugar. Shop around the outside edge of the grocery store. This  is where you will most often find fresh fruits and vegetables, bulk grains, fresh meats, and fresh dairy. Cooking Use low-heat cooking methods, such as baking, instead of high-heat cooking methods like deep frying. Cook using healthy oils, such as olive, canola, or sunflower oil. Avoid cooking with butter, cream, or high-fat meats. Meal planning Eat meals and snacks regularly, preferably at the same times every day. Avoid going long periods of time without eating. Eat foods that are high in fiber, such as fresh fruits, vegetables, beans, and whole grains. Talk with your dietitian about how many servings of carbs you can eat at each meal. Eat 4-6 oz (112-168 g) of lean protein each day, such as lean meat, chicken, fish, eggs, or tofu. One ounce (oz) of lean protein is equal to: 1 oz (28 g) of meat, chicken, or fish. 1 egg.  cup (62 g) of tofu. Eat some foods each day that contain healthy fats, such as avocado, nuts, seeds, and fish. What foods should I eat? Fruits Berries. Apples. Oranges. Peaches. Apricots. Plums. Grapes. Mango. Papaya. Pomegranate. Kiwi. Cherries. Vegetables Lettuce. Spinach. Leafy greens, including kale, chard, collard greens, and mustard greens. Beets. Cauliflower. Cabbage. Broccoli. Carrots. Green beans. Tomatoes. Peppers. Onions. Cucumbers. Brussels sprouts. Grains Whole grains, such as whole-wheat or whole-grain bread, crackers, tortillas, cereal, and pasta. Unsweetened oatmeal. Quinoa. Brown or wild rice. Meats and other proteins Seafood. Poultry without skin. Lean cuts of poultry and beef. Tofu. Nuts. Seeds. Dairy Low-fat or fat-free dairy products such as milk, yogurt, and cheese. The items listed above may not be a complete list of foods and beverages you can eat. Contact a dietitian for more information. What foods should I avoid? Fruits Fruits canned with syrup. Vegetables Canned vegetables. Frozen vegetables with butter or cream sauce. Grains Refined  white flour and flour products such as bread, pasta, snack foods, and cereals. Avoid all processed foods. Meats and other proteins Fatty cuts of meat. Poultry with skin. Breaded or fried meats. Processed meat. Avoid saturated fats. Dairy Full-fat yogurt, cheese, or milk. Beverages Sweetened drinks, such as soda or iced tea. The items listed above may not be a complete list of foods and beverages you should avoid. Contact a dietitian for more information. Questions to ask a health care provider Do I need to meet with a diabetes educator? Do I need to meet with a dietitian? What number can I call if I have questions? When are the best times to check my blood glucose? Where to find more information: American Diabetes Association: diabetes.org Academy of Nutrition and Dietetics: www.eatright.org National Institute of Diabetes and Digestive and Kidney Diseases: www.niddk.nih.gov Association of Diabetes Care and Education Specialists: www.diabeteseducator.org Summary It is important to have healthy eating habits because your blood sugar (glucose) levels are greatly affected by what you eat and drink. A healthy meal plan will help you control your blood glucose and maintain a healthy lifestyle. Your health care provider may recommend that you work with a dietitian to make a meal plan that   is best for you. Keep in mind that carbohydrates (carbs) and alcohol have immediate effects on your blood glucose levels. It is important to count carbs and to use alcohol carefully. This information is not intended to replace advice given to you by your health care provider. Make sure you discuss any questions you have with your health care provider. Document Revised: 12/24/2018 Document Reviewed: 12/24/2018 Elsevier Patient Education  2021 Elsevier Inc. Diabetes Mellitus Basics Diabetes mellitus, or diabetes, is a long-term (chronic) disease. It occurs when the body does not properly use sugar (glucose) that  is released from food after you eat. Diabetes mellitus may be caused by one or both of these problems: Your pancreas does not make enough of a hormone called insulin. Your body does not react in a normal way to the insulin that it makes. Insulin lets glucose enter cells in your body. This gives you energy. If you have diabetes, glucose cannot get into cells. This causes high blood glucose (hyperglycemia). How to treat and manage diabetes You may need to take insulin or other diabetes medicines daily to keep your glucose in balance. If you are prescribed insulin, you will learn how to give yourself insulin by injection. You may need to adjust the amount of insulin you take based on the foods that you eat. You will need to check your blood glucose levels using a glucose monitor as told by your health care provider. The readings can help determine if you have low or high blood glucose. Generally, you should have these blood glucose levels: Before meals (preprandial): 80-130 mg/dL (4.4-7.2 mmol/L). After meals (postprandial): below 180 mg/dL (10 mmol/L). Hemoglobin A1c (HbA1c) level: less than 7%. Your health care provider will set treatment goals for you. Keep all follow-up visits. This is important. Follow these instructions at home: Diabetes medicines Take your diabetes medicines every day as told by your health care provider. List your diabetes medicines here: Name of medicine: ______________________________ Amount (dose): _______________ Time (a.m./p.m.): _______________ Notes: ___________________________________ Name of medicine: ______________________________ Amount (dose): _______________ Time (a.m./p.m.): _______________ Notes: ___________________________________ Name of medicine: ______________________________ Amount (dose): _______________ Time (a.m./p.m.): _______________ Notes: ___________________________________ Insulin If you use insulin, list the types of insulin you use  here: Insulin type: ______________________________ Amount (dose): _______________ Time (a.m./p.m.): _______________Notes: ___________________________________ Insulin type: ______________________________ Amount (dose): _______________ Time (a.m./p.m.): _______________ Notes: ___________________________________ Insulin type: ______________________________ Amount (dose): _______________ Time (a.m./p.m.): _______________ Notes: ___________________________________ Insulin type: ______________________________ Amount (dose): _______________ Time (a.m./p.m.): _______________ Notes: ___________________________________ Insulin type: ______________________________ Amount (dose): _______________ Time (a.m./p.m.): _______________ Notes: ___________________________________ Managing blood glucose Check your blood glucose levels using a glucose monitor as told by your health care provider. Write down the times that you check your glucose levels here: Time: _______________ Notes: ___________________________________ Time: _______________ Notes: ___________________________________ Time: _______________ Notes: ___________________________________ Time: _______________ Notes: ___________________________________ Time: _______________ Notes: ___________________________________ Time: _______________ Notes: ___________________________________  Low blood glucose Low blood glucose (hypoglycemia) is when glucose is at or below 70 mg/dL (3.9 mmol/L). Symptoms may include: Feeling: Hungry. Sweaty and clammy. Irritable or easily upset. Dizzy. Sleepy. Having: A fast heartbeat. A headache. A change in your vision. Numbness around the mouth, lips, or tongue. Having trouble with: Moving (coordination). Sleeping. Treating low blood glucose To treat low blood glucose, eat or drink something containing sugar right away. If you can think clearly and swallow safely, follow the 15:15 rule: Take 15 grams of a  fast-acting carb (carbohydrate), as told by your health care provider. Some fast-acting carbs are: Glucose tablets: take 3-4 tablets.   Hard candy: eat 3-5 pieces. Fruit juice: drink 4 oz (120 mL). Regular (not diet) soda: drink 4-6 oz (120-180 mL). Honey or sugar: eat 1 Tbsp (15 mL). Check your blood glucose levels 15 minutes after you take the carb. If your glucose is still at or below 70 mg/dL (3.9 mmol/L), take 15 grams of a carb again. If your glucose does not go above 70 mg/dL (3.9 mmol/L) after 3 tries, get help right away. After your glucose goes back to normal, eat a meal or a snack within 1 hour. Treating very low blood glucose If your glucose is at or below 54 mg/dL (3 mmol/L), you have very low blood glucose (severe hypoglycemia). This is an emergency. Do not wait to see if the symptoms will go away. Get medical help right away. Call your local emergency services (911 in the [place] Do not drive yourself to the hospital. Questions to ask your health care provider Should I talk with a diabetes educator? What equipment will I need to care for myself at home? What diabetes medicines do I need? When should I take them? How often do I need to check my blood glucose levels? What number can I call if I have questions? When is my follow-up visit? Where can I find a support group for people with diabetes? Where to find more information American Diabetes Association: www.diabetes.org Association of Diabetes Care and Education Specialists: www.diabeteseducator.org Contact a health care provider if: Your blood glucose is at or above 240 mg/dL (13.3 mmol/L) for 2 days in a row. You have been sick or have had a fever for 2 days or more, and you are not getting better. You have any of these problems for more than 6 hours: You cannot eat or drink. You feel nauseous. You vomit. You have diarrhea. Get help right away if: Your blood glucose is lower than 54 mg/dL (3 mmol/L). You get  confused. You have trouble thinking clearly. You have trouble breathing. These symptoms may represent a serious problem that is an emergency. Do not wait to see if the symptoms will go away. Get medical help right away. Call your local emergency services (911 in the [place] Do not drive yourself to the hospital. Summary Diabetes mellitus is a chronic disease that occurs when the body does not properly use sugar (glucose) that is released from food after you eat. Take insulin and diabetes medicines as told. Check your blood glucose every day, as often as told. Keep all follow-up visits. This is important. This information is not intended to replace advice given to you by your health care provider. Make sure you discuss any questions you have with your health care provider. Document Revised: 05/20/2019 Document Reviewed: 05/20/2019 Elsevier Patient Education  2022 Elsevier Inc.   

## 2020-08-13 NOTE — Progress Notes (Signed)
[facility] Patient [facility] [address] [place], [place]  [address] Phone:  [phone]   Fax:  [phone]   Established Patient Office Visit  Subjective:  Patient ID: [name], female    DOB: [birth year]  Age: 40 y.o. MRN: [id number]  CC:  Chief Complaint  Patient presents with   Follow-up    Wants to have thyroid checked.  Stomach is hard to touch always in pain.     HPI [name] presents for folllow up. She  has a past medical history of Anemia, Anxiety (03/2019), Insomnia (04/07/2019), Kidney stone, Low iron stores (12/2019), Migraine, and Vitamin D deficiency (03/2019).   Abdominal Pain Patient complains of abdominal pain. The pain is described as aching, burning, cramping, pressure, and sharp, and is 7/10 in intensity. The patient is experiencing LLQ, LUQ, RLQ, and RUQ pain without radiation. Onset was  12  months ago. Symptoms have been unchanged. Aggravating factors: none.  Alleviating factors: none. Associated symptoms: none. The patient denies anorexia, arthralagias, belching, chills, constipation, diarrhea, dysuria, fever, flatus, frequency, headache, hematochezia, hematuria, melena, myalgias, nausea, sweats, and vomiting.   Past Medical History:  Diagnosis Date   Anemia    Anxiety 03/2019   Insomnia 04/07/2019   Kidney stone    Low iron stores 12/2019   Migraine    Vitamin D deficiency 03/2019    Past Surgical History:  Procedure Laterality Date   CESAREAN SECTION     TUBAL LIGATION Left June 2012   WISDOM TOOTH EXTRACTION      Family History  Problem Relation Age of Onset   Diabetes Mother    Hypertension Mother    Diabetes Maternal Grandmother    Hypertension Maternal Grandmother    Cancer Maternal Grandmother    Diabetes Maternal Grandfather    Hypertension Maternal Grandfather    Healthy Father     Social History   Socioeconomic History   Marital status: Single    Spouse name: Not on file   Number of children: Not on  file   Years of education: Not on file   Highest education level: Not on file  Occupational History   Not on file  Tobacco Use   Smoking status: Never   Smokeless tobacco: Never  Vaping Use   Vaping Use: Never used  Substance and Sexual Activity   Alcohol use: No   Drug use: No   Sexual activity: Yes    Birth control/protection: Condom  Other Topics Concern   Not on file  Social History Narrative   Not on file   Social Determinants of Health   Financial Resource Strain: Not on file  Food Insecurity: Not on file  Transportation Needs: Not on file  Physical Activity: Not on file  Stress: Not on file  Social Connections: Not on file  Intimate Partner Violence: Not on file    Outpatient Medications Prior to Visit  Medication Sig Dispense Refill   ferrous sulfate 325 (65 FE) MG EC tablet Take 1 tablet (325 mg total) by mouth daily. 30 tablet 11   ibuprofen (ADVIL) 800 MG tablet Take 1 tablet (800 mg total) by mouth 3 (three) times daily. 21 tablet 0   nystatin (MYCOSTATIN) 100000 UNIT/ML suspension Take 5 mLs (500,000 Units total) by mouth 4 (four) times daily. 60 mL 0   traZODone (DESYREL) 50 MG tablet TAKE 1 TABLET BY MOUTH EVERYDAY AT BEDTIME 90 tablet 3   Vitamin D, Ergocalciferol, (DRISDOL) 1.25 MG (50000 UNIT)  CAPS capsule Take 1 capsule (50,000 Units total) by mouth every 7 (seven) days. 5 capsule 6   No facility-administered medications prior to visit.    No Known Allergies  ROS Review of Systems  Genitourinary:        Strong odor with urination     Objective:    Physical Exam  BP (!) 116/58   Pulse 85   Temp (!) 97.4 F (36.3 C)   Ht 5\' 7"  (1.702 m)   Wt 228 lb 0.8 oz (103.4 kg)   LMP 08/09/2020   BMI 35.72 kg/m  Wt Readings from Last 3 Encounters:  08/13/20 228 lb 0.8 oz (103.4 kg)  12/09/19 229 lb (103.9 kg)  04/07/19 226 lb 3.2 oz (102.6 kg)     Health Maintenance Due  Topic Date Due   HIV Screening  Never done   Hepatitis C Screening   Never done   TETANUS/TDAP  Never done   PAP SMEAR-Modifier  Never done   COVID-19 Vaccine (2 - Pfizer series) 12/17/2019    There are no preventive care reminders to display for this patient.  Lab Results  Component Value Date   TSH 1.210 12/09/2019   Lab Results  Component Value Date   WBC 8.6 12/09/2019   HGB 12.7 12/09/2019   HCT 42.2 12/09/2019   MCV 73 (L) 12/09/2019   PLT 392 12/09/2019   Lab Results  Component Value Date   NA 134 12/09/2019   K 4.6 12/09/2019   CO2 25 12/09/2019   GLUCOSE 145 (H) 12/09/2019   BUN 6 12/09/2019   CREATININE 0.82 12/09/2019   BILITOT 0.4 12/09/2019   ALKPHOS 65 12/09/2019   AST 12 12/09/2019   ALT 12 12/09/2019   PROT 7.4 12/09/2019   ALBUMIN 4.3 12/09/2019   CALCIUM 9.4 12/09/2019   ANIONGAP 8 09/25/2016   Lab Results  Component Value Date   CHOL 182 12/09/2019   Lab Results  Component Value Date   HDL 40 12/09/2019   Lab Results  Component Value Date   LDLCALC 126 (H) 12/09/2019   Lab Results  Component Value Date   TRIG 89 12/09/2019   Lab Results  Component Value Date   CHOLHDL 4.6 (H) 12/09/2019   Lab Results  Component Value Date   HGBA1C 7.1 (A) 04/07/2019      Assessment & Plan:   Problem List Items Addressed This Visit   None Visit Diagnoses     Annual physical exam    -  Primary   Relevant Orders   Urinalysis Dipstick (Completed)   Uncontrolled type 2 diabetes mellitus with hyperglycemia (HCC)           No orders of the defined types were placed in this encounter.   Follow-up: No follow-ups on file.    06/07/2019, NP

## 2020-08-14 LAB — MICROALBUMIN, URINE: Microalbumin, Urine: 56.8 ug/mL

## 2020-08-14 LAB — LIPID PANEL
Chol/HDL Ratio: 4.2 ratio (ref 0.0–4.4)
Cholesterol, Total: 193 mg/dL (ref 100–199)
HDL: 46 mg/dL (ref >39)
LDL Chol Calc (NIH): 127 mg/dL — ABNORMAL HIGH (ref 0–99)
Triglycerides: 108 mg/dL (ref 0–149)
VLDL Cholesterol Cal: 20 mg/dL (ref 5–40)

## 2020-08-14 LAB — CBC WITH DIFFERENTIAL/PLATELET
Basophils Absolute: 0.1 10*3/uL (ref 0.0–0.2)
Basos: 1 %
EOS (ABSOLUTE): 0.3 10*3/uL (ref 0.0–0.4)
Eos: 3 %
Hematocrit: 42 % (ref 34.0–46.6)
Hemoglobin: 12.6 g/dL (ref 11.1–15.9)
Immature Grans (Abs): 0 10*3/uL (ref 0.0–0.1)
Immature Granulocytes: 0 %
Lymphocytes Absolute: 3.6 10*3/uL — ABNORMAL HIGH (ref 0.7–3.1)
Lymphs: 35 %
MCH: 21.6 pg — ABNORMAL LOW (ref 26.6–33.0)
MCHC: 30 g/dL — ABNORMAL LOW (ref 31.5–35.7)
MCV: 72 fL — ABNORMAL LOW (ref 79–97)
Monocytes Absolute: 0.7 10*3/uL (ref 0.1–0.9)
Monocytes: 7 %
Neutrophils Absolute: 5.6 10*3/uL (ref 1.4–7.0)
Neutrophils: 54 %
Platelets: 398 10*3/uL (ref 150–450)
RBC: 5.82 x10E6/uL — ABNORMAL HIGH (ref 3.77–5.28)
RDW: 14.3 % (ref 11.7–15.4)
WBC: 10.2 10*3/uL (ref 3.4–10.8)

## 2020-08-14 LAB — COMP. METABOLIC PANEL (12)
AST: 20 IU/L (ref 0–40)
Albumin/Globulin Ratio: 1.4 (ref 1.2–2.2)
Albumin: 4.6 g/dL (ref 3.8–4.8)
Alkaline Phosphatase: 76 IU/L (ref 44–121)
BUN/Creatinine Ratio: 8 — ABNORMAL LOW (ref 9–23)
BUN: 7 mg/dL (ref 6–24)
Bilirubin Total: 0.6 mg/dL (ref 0.0–1.2)
Calcium: 9.6 mg/dL (ref 8.7–10.2)
Chloride: 99 mmol/L (ref 96–106)
Creatinine, Ser: 0.84 mg/dL (ref 0.57–1.00)
Globulin, Total: 3.4 g/dL (ref 1.5–4.5)
Glucose: 187 mg/dL — ABNORMAL HIGH (ref 65–99)
Potassium: 4.3 mmol/L (ref 3.5–5.2)
Sodium: 135 mmol/L (ref 134–144)
Total Protein: 8 g/dL (ref 6.0–8.5)
eGFR: 89 mL/min/{1.73_m2} (ref >59)

## 2020-08-14 LAB — TSH: TSH: 1.14 u[IU]/mL (ref 0.450–4.500)

## 2020-08-16 ENCOUNTER — Other Ambulatory Visit: Payer: Self-pay | Admitting: Nurse Practitioner

## 2020-08-16 ENCOUNTER — Telehealth: Payer: Self-pay | Admitting: Nurse Practitioner

## 2020-08-16 DIAGNOSIS — F419 Anxiety disorder, unspecified: Secondary | ICD-10-CM

## 2020-08-16 DIAGNOSIS — R790 Abnormal level of blood mineral: Secondary | ICD-10-CM

## 2020-08-16 DIAGNOSIS — E559 Vitamin D deficiency, unspecified: Secondary | ICD-10-CM

## 2020-08-16 MED ORDER — VITAMIN D (ERGOCALCIFEROL) 1.25 MG (50000 UNIT) PO CAPS: 50000.0000 [IU] | ORAL_CAPSULE | ORAL | 6 refills | Status: DC

## 2020-08-16 MED ORDER — FERROUS SULFATE 325 (65 FE) MG PO TBEC: 325.0000 mg | DELAYED_RELEASE_TABLET | Freq: Every day | ORAL | 11 refills | Status: AC

## 2020-08-16 MED ORDER — IBUPROFEN 800 MG PO TABS: 800.0000 mg | ORAL_TABLET | Freq: Three times a day (TID) | ORAL | 0 refills | Status: DC

## 2020-08-16 MED ORDER — TRAZODONE HCL 50 MG PO TABS: ORAL_TABLET | ORAL | 3 refills | Status: DC

## 2020-08-16 NOTE — Telephone Encounter (Signed)
Pt called in to check status of the following AND/OR if they could be sent.   metFORMIN (GLUCOPHAGE) 500 MG tablet [[id number]]  blood glucose meter kit and supplies KIT [[id number]]  ibuprofen (ADVIL) 800 MG tablet [[id number]]  Vitamin D, Ergocalciferol, (DRISDOL) 1.25 MG (50000 UNIT) CAPS capsule [[id number]]  ferrous sulfate 325 (65 FE) MG EC tablet [[id number]]  traZODone (DESYREL) 50 MG tablet [[id number]] nystatin (MYCOSTATIN) 100000 UNIT/ML suspension [[id number]]   Pharmacy  [id number], [id number] Phone:  [phone] Fax:  [phone]  Thank you

## 2020-08-18 ENCOUNTER — Other Ambulatory Visit: Payer: Self-pay | Admitting: Nurse Practitioner

## 2020-08-18 DIAGNOSIS — Z1231 Encounter for screening mammogram for malignant neoplasm of breast: Secondary | ICD-10-CM

## 2020-08-23 NOTE — Progress Notes (Signed)
Patient notified of results, no additional questions or concerns.

## 2020-09-16 ENCOUNTER — Other Ambulatory Visit: Payer: Self-pay | Admitting: Nurse Practitioner

## 2020-09-17 ENCOUNTER — Telehealth (INDEPENDENT_AMBULATORY_CARE_PROVIDER_SITE_OTHER): Payer: 59 | Admitting: Nurse Practitioner

## 2020-09-17 ENCOUNTER — Encounter: Payer: Self-pay | Admitting: Nurse Practitioner

## 2020-09-17 ENCOUNTER — Other Ambulatory Visit: Payer: Self-pay

## 2020-09-17 DIAGNOSIS — E1165 Type 2 diabetes mellitus with hyperglycemia: Secondary | ICD-10-CM

## 2020-09-17 DIAGNOSIS — R519 Headache, unspecified: Secondary | ICD-10-CM | POA: Diagnosis not present

## 2020-09-17 MED ORDER — IBUPROFEN 800 MG PO TABS: 800.0000 mg | ORAL_TABLET | Freq: Three times a day (TID) | ORAL | 0 refills | Status: DC

## 2020-09-17 NOTE — Progress Notes (Signed)
   [facility] Patient [facility] [address] [place], [place]  [address] Phone:  [phone]   Fax:  [phone] Virtual Visit via Telephone Note  I connected with [name] on 09/17/20 at  3:00 PM EDT by telephone and verified that I am speaking with the correct person using two identifiers.   I discussed the limitations, risks, security and privacy concerns of performing an evaluation and management service by telephone and the availability of in person appointments. I also discussed with the patient that there may be a patient responsible charge related to this service. The patient expressed understanding and agreed to proceed.  Patient home Provider Office  History of Present Illness:  [name]  has a past medical history of Anemia, Anxiety (03/2019), Insomnia (04/07/2019), Kidney stone, Low iron stores (12/2019), Migraine, and Vitamin D deficiency (03/2019).   Diabetes Mellitus Patient presents for follow up of diabetes. Current symptoms include: hyperglycemia. Symptoms have stabilized. Patient denies hyperglycemia, polydipsia, polyuria, and visual disturbances. Evaluation to date has included: hemoglobin A1C.  Home sugars: BGs range between 137 and 326 . Current treatment: more intensive attention to diet which has been somewhat effective and Continued metformin which has been somewhat effective. Last dilated eye exam: 05/22.   She experienced a loss of her cousin. She reports that she will be seeing the nutritionist in end of month.   Review of Systems  Respiratory:  Positive for shortness of breath (on exertion).     Observations/Objective: No exam; telephone visit  Assessment and Plan: 1. Uncontrolled type 2 diabetes mellitus with hyperglycemia (HCC) - Accu-Chek Softclix Lancets lancets; SMARTSIG:Topical 1-4 Times Daily Encourage compliance with current treatment regimen   Encourage regular CBG monitoring Encourage contacting office if excessive  hyperglycemia and or hypoglycemia Lifestyle modification with healthy diet (fewer calories, more high fiber foods, whole grains and non-starchy vegetables, lower fat meat and fish, low-fat diary include healthy oils) regular exercise (physical activity) and weight loss Opthalmology exam discussed  Nutritional consult recommended Statin therapy discussed   2. Nonintractable headache, unspecified chronicity pattern, unspecified headache type - ibuprofen (ADVIL) 800 MG tablet; Take 1 tablet (800 mg total) by mouth 3 (three) times daily.  Dispense: 21 tablet; Refill: 0  Follow Up Instructions: 6 weeks    I discussed the assessment and treatment plan with the patient. The patient was provided an opportunity to ask questions and all were answered. The patient agreed with the plan and demonstrated an understanding of the instructions.   The patient was advised to call back or seek an in-person evaluation if the symptoms worsen or if the condition fails to improve as anticipated.  I provided 10 minutes of telephone- visit time during this encounter.   Barbette Merino, NP

## 2020-09-17 NOTE — Patient Instructions (Signed)
Diabetes Mellitus and Nutrition, Adult When you have diabetes, or diabetes mellitus, it is very important to have healthy eating habits because your blood sugar (glucose) levels are greatly affected by what you eat and drink. Eating healthy foods in the right amounts, at about the same times every day, can help you:  Control your blood glucose.  Lower your risk of heart disease.  Improve your blood pressure.  Reach or maintain a healthy weight. What can affect my meal plan? Every person with diabetes is different, and each person has different needs for a meal plan. Your health care provider may recommend that you work with a dietitian to make a meal plan that is best for you. Your meal plan may vary depending on factors such as:  The calories you need.  The medicines you take.  Your weight.  Your blood glucose, blood pressure, and cholesterol levels.  Your activity level.  Other health conditions you have, such as heart or kidney disease. How do carbohydrates affect me? Carbohydrates, also called carbs, affect your blood glucose level more than any other type of food. Eating carbs naturally raises the amount of glucose in your blood. Carb counting is a method for keeping track of how many carbs you eat. Counting carbs is important to keep your blood glucose at a healthy level, especially if you use insulin or take certain oral diabetes medicines. It is important to know how many carbs you can safely have in each meal. This is different for every person. Your dietitian can help you calculate how many carbs you should have at each meal and for each snack. How does alcohol affect me? Alcohol can cause a sudden decrease in blood glucose (hypoglycemia), especially if you use insulin or take certain oral diabetes medicines. Hypoglycemia can be a life-threatening condition. Symptoms of hypoglycemia, such as sleepiness, dizziness, and confusion, are similar to symptoms of having too much  alcohol.  Do not drink alcohol if: ? Your health care provider tells you not to drink. ? You are pregnant, may be pregnant, or are planning to become pregnant.  If you drink alcohol: ? Do not drink on an empty stomach. ? Limit how much you use to:  0-1 drink a day for women.  0-2 drinks a day for men. ? Be aware of how much alcohol is in your drink. In the [place] one drink equals one 12 oz bottle of beer (355 mL), one 5 oz glass of wine (148 mL), or one 1 oz glass of hard liquor (44 mL). ? Keep yourself hydrated with water, diet soda, or unsweetened iced tea.  Keep in mind that regular soda, juice, and other mixers may contain a lot of sugar and must be counted as carbs. What are tips for following this plan? Reading food labels  Start by checking the serving size on the "Nutrition Facts" label of packaged foods and drinks. The amount of calories, carbs, fats, and other nutrients listed on the label is based on one serving of the item. Many items contain more than one serving per package.  Check the total grams (g) of carbs in one serving. You can calculate the number of servings of carbs in one serving by dividing the total carbs by 15. For example, if a food has 30 g of total carbs per serving, it would be equal to 2 servings of carbs.  Check the number of grams (g) of saturated fats and trans fats in one serving. Choose foods that have   a low amount or none of these fats.  Check the number of milligrams (mg) of salt (sodium) in one serving. Most people should limit total sodium intake to less than 2,300 mg per day.  Always check the nutrition information of foods labeled as "low-fat" or "nonfat." These foods may be higher in added sugar or refined carbs and should be avoided.  Talk to your dietitian to identify your daily goals for nutrients listed on the label. Shopping  Avoid buying canned, pre-made, or processed foods. These foods tend to be high in fat, sodium, and added  sugar.  Shop around the outside edge of the grocery store. This is where you will most often find fresh fruits and vegetables, bulk grains, fresh meats, and fresh dairy. Cooking  Use low-heat cooking methods, such as baking, instead of high-heat cooking methods like deep frying.  Cook using healthy oils, such as olive, canola, or sunflower oil.  Avoid cooking with butter, cream, or high-fat meats. Meal planning  Eat meals and snacks regularly, preferably at the same times every day. Avoid going long periods of time without eating.  Eat foods that are high in fiber, such as fresh fruits, vegetables, beans, and whole grains. Talk with your dietitian about how many servings of carbs you can eat at each meal.  Eat 4-6 oz (112-168 g) of lean protein each day, such as lean meat, chicken, fish, eggs, or tofu. One ounce (oz) of lean protein is equal to: ? 1 oz (28 g) of meat, chicken, or fish. ? 1 egg. ?  cup (62 g) of tofu.  Eat some foods each day that contain healthy fats, such as avocado, nuts, seeds, and fish.   What foods should I eat? Fruits Berries. Apples. Oranges. Peaches. Apricots. Plums. Grapes. Mango. Papaya. Pomegranate. Kiwi. Cherries. Vegetables Lettuce. Spinach. Leafy greens, including kale, chard, collard greens, and mustard greens. Beets. Cauliflower. Cabbage. Broccoli. Carrots. Green beans. Tomatoes. Peppers. Onions. Cucumbers. Brussels sprouts. Grains Whole grains, such as whole-wheat or whole-grain bread, crackers, tortillas, cereal, and pasta. Unsweetened oatmeal. Quinoa. Brown or wild rice. Meats and other proteins Seafood. Poultry without skin. Lean cuts of poultry and beef. Tofu. Nuts. Seeds. Dairy Low-fat or fat-free dairy products such as milk, yogurt, and cheese. The items listed above may not be a complete list of foods and beverages you can eat. Contact a dietitian for more information. What foods should I avoid? Fruits Fruits canned with  syrup. Vegetables Canned vegetables. Frozen vegetables with butter or cream sauce. Grains Refined white flour and flour products such as bread, pasta, snack foods, and cereals. Avoid all processed foods. Meats and other proteins Fatty cuts of meat. Poultry with skin. Breaded or fried meats. Processed meat. Avoid saturated fats. Dairy Full-fat yogurt, cheese, or milk. Beverages Sweetened drinks, such as soda or iced tea. The items listed above may not be a complete list of foods and beverages you should avoid. Contact a dietitian for more information. Questions to ask a health care provider  Do I need to meet with a diabetes educator?  Do I need to meet with a dietitian?  What number can I call if I have questions?  When are the best times to check my blood glucose? Where to find more information:  American Diabetes Association: diabetes.org  Academy of Nutrition and Dietetics: www.eatright.org  National Institute of Diabetes and Digestive and Kidney Diseases: www.niddk.nih.gov  Association of Diabetes Care and Education Specialists: www.diabeteseducator.org Summary  It is important to have healthy eating   habits because your blood sugar (glucose) levels are greatly affected by what you eat and drink.  A healthy meal plan will help you control your blood glucose and maintain a healthy lifestyle.  Your health care provider may recommend that you work with a dietitian to make a meal plan that is best for you.  Keep in mind that carbohydrates (carbs) and alcohol have immediate effects on your blood glucose levels. It is important to count carbs and to use alcohol carefully. This information is not intended to replace advice given to you by your health care provider. Make sure you discuss any questions you have with your health care provider. Document Revised: 12/24/2018 Document Reviewed: 12/24/2018 Elsevier Patient Education  2021 Elsevier Inc.  

## 2020-10-12 ENCOUNTER — Ambulatory Visit: Payer: 59

## 2020-11-03 ENCOUNTER — Ambulatory Visit: Payer: 59 | Admitting: Nurse Practitioner

## 2020-11-04 ENCOUNTER — Encounter: Payer: 59 | Attending: Nurse Practitioner | Admitting: Dietician

## 2020-11-16 ENCOUNTER — Ambulatory Visit
Admission: RE | Admit: 2020-11-16 | Discharge: 2020-11-16 | Disposition: A | Discharge status: Home / Self Care | Payer: 59 | Source: Ambulatory Visit | Attending: Nurse Practitioner | Admitting: Nurse Practitioner

## 2020-11-16 ENCOUNTER — Other Ambulatory Visit: Payer: Self-pay

## 2020-11-16 DIAGNOSIS — Z1231 Encounter for screening mammogram for malignant neoplasm of breast: Secondary | ICD-10-CM | POA: Diagnosis not present

## 2020-11-19 ENCOUNTER — Other Ambulatory Visit: Payer: Self-pay

## 2020-11-19 ENCOUNTER — Ambulatory Visit (INDEPENDENT_AMBULATORY_CARE_PROVIDER_SITE_OTHER): Payer: 59 | Admitting: Nurse Practitioner

## 2020-11-19 ENCOUNTER — Encounter: Payer: Self-pay | Admitting: Nurse Practitioner

## 2020-11-19 VITALS — BP 125/81 | HR 91 | Temp 98.0°F | Ht 67.0 in | Wt 229.4 lb

## 2020-11-19 DIAGNOSIS — M5440 Lumbago with sciatica, unspecified side: Secondary | ICD-10-CM | POA: Diagnosis not present

## 2020-11-19 DIAGNOSIS — E1165 Type 2 diabetes mellitus with hyperglycemia: Secondary | ICD-10-CM

## 2020-11-19 DIAGNOSIS — N939 Abnormal uterine and vaginal bleeding, unspecified: Secondary | ICD-10-CM | POA: Diagnosis not present

## 2020-11-19 LAB — POCT GLYCOSYLATED HEMOGLOBIN (HGB A1C)
HbA1c POC (<> result, manual entry): 9 % (ref 4.0–5.6)
HbA1c, POC (controlled diabetic range): 9 % — AB (ref 0.0–7.0)
HbA1c, POC (prediabetic range): 9 % — AB (ref 5.7–6.4)
Hemoglobin A1C: 9 % — AB (ref 4.0–5.6)

## 2020-11-19 MED ORDER — METFORMIN HCL 1000 MG PO TABS: 1000.0000 mg | ORAL_TABLET | Freq: Two times a day (BID) | ORAL | 3 refills | Status: DC

## 2020-11-19 MED ORDER — CYCLOBENZAPRINE HCL 5 MG PO TABS: 5.0000 mg | ORAL_TABLET | Freq: Every day | ORAL | 1 refills | Status: DC

## 2020-11-19 MED ORDER — ACCU-CHEK GUIDE VI STRP: ORAL_STRIP | 1 refills | Status: DC

## 2020-11-19 MED ORDER — ACCU-CHEK SOFTCLIX LANCETS MISC
1 refills | Status: DC
Start: 2020-11-19 — End: 2021-07-11

## 2020-11-19 NOTE — Progress Notes (Signed)
[facility], [place]  [address] Phone:  [phone]   Fax:  [phone]   Established Patient Office Visit  Subjective:  Patient ID: [name], female    DOB: [birth year]  Age: 40 y.o. MRN: [id number]  CC:  Chief Complaint  Patient presents with   Follow-up    Follow up;Diabetes  Pt is on her menstrual cycle and will reschedule for her pap smear. Pt wants to increase her dosage due to her blood sugar level still elevated.         HPI [name] presents for follow up. She  has a past medical history of Anemia, Anxiety (03/2019), Insomnia (04/07/2019), Kidney stone, Low iron stores (12/2019), Migraine, and Vitamin D deficiency (03/2019).   She is following up for her DM. She reports that eating. She reports 200-276 . She has been taking metformin 500 mg BID.  She did not realize that her dose direction have changed. She is having headaches. She drink wine and body armour. She has been under increased stress. She does continue to work full time form home. She does not exercise. She has maintained her weight.   Past Medical History:  Diagnosis Date   Anemia    Anxiety 03/2019   Insomnia 04/07/2019   Kidney stone    Low iron stores 12/2019   Migraine    Vitamin D deficiency 03/2019    Past Surgical History:  Procedure Laterality Date   CESAREAN SECTION     TUBAL LIGATION Left June 2012   WISDOM TOOTH EXTRACTION      Family History  Problem Relation Age of Onset   Diabetes Mother    Hypertension Mother    Healthy Father    Breast cancer Maternal Aunt    Breast cancer Maternal Grandmother    Diabetes Maternal Grandmother    Hypertension Maternal Grandmother    Cancer Maternal Grandmother    Diabetes Maternal Grandfather    Hypertension Maternal Grandfather    Breast cancer Cousin     Social History   Socioeconomic History   Marital status: Single    Spouse name: Not on file   Number of children: Not  on file   Years of education: Not on file   Highest education level: Not on file  Occupational History   Not on file  Tobacco Use   Smoking status: Never   Smokeless tobacco: Never  Vaping Use   Vaping Use: Never used  Substance and Sexual Activity   Alcohol use: No   Drug use: No   Sexual activity: Yes    Birth control/protection: Condom  Other Topics Concern   Not on file  Social History Narrative   Not on file   Social Determinants of Health   Financial Resource Strain: Not on file  Food Insecurity: Not on file  Transportation Needs: Not on file  Physical Activity: Not on file  Stress: Not on file  Social Connections: Not on file  Intimate Partner Violence: Not on file    Outpatient Medications Prior to Visit  Medication Sig Dispense Refill   blood glucose meter kit and supplies KIT Dispense based on patient and insurance preference. Use up to four times daily as directed. 1 each 0   ferrous sulfate 325 (65 FE) MG EC tablet Take 1 tablet (325 mg total) by mouth daily. 30 tablet 11   ibuprofen (ADVIL) 800 MG tablet Take 1 tablet (800 mg total) by mouth  3 (three) times daily. 21 tablet 0   nystatin (MYCOSTATIN) 100000 UNIT/ML suspension Take 5 mLs (500,000 Units total) by mouth 4 (four) times daily. 60 mL 0   traZODone (DESYREL) 50 MG tablet TAKE 1 TABLET BY MOUTH EVERYDAY AT BEDTIME 90 tablet 3   Vitamin D, Ergocalciferol, (DRISDOL) 1.25 MG (50000 UNIT) CAPS capsule Take 1 capsule (50,000 Units total) by mouth every 7 (seven) days. 5 capsule 6   ACCU-CHEK GUIDE test strip USE UP TO 4 TIMES DAILY AS DIRECTED 100 strip 1   Accu-Chek Softclix Lancets lancets SMARTSIG:Topical 1-4 Times Daily     metFORMIN (GLUCOPHAGE) 500 MG tablet Take 1 tablet (500 mg total) by mouth 2 (two) times daily with a meal for 7 days, THEN 2 tablets (1,000 mg total) 2 (two) times daily with a meal. 180 tablet 3   No facility-administered medications prior to visit.    No Known  Allergies  ROS Review of Systems    Objective:    Physical Exam Constitutional:      General: She is not in acute distress.    Appearance: She is obese. She is not ill-appearing, toxic-appearing or diaphoretic.  HENT:     Head: Normocephalic and atraumatic.  Cardiovascular:     Rate and Rhythm: Normal rate and regular rhythm.     Pulses: Normal pulses.     Heart sounds: Normal heart sounds.  Musculoskeletal:        General: Normal range of motion.     Cervical back: Normal range of motion.  Skin:    General: Skin is warm and dry.  Neurological:     Mental Status: She is alert.    BP 125/81   Pulse 91   Temp 98 F (36.7 C)   Ht 5' 7" (1.702 m)   Wt 229 lb 6.4 oz (104.1 kg)   SpO2 98%   BMI 35.93 kg/m  Wt Readings from Last 3 Encounters:  11/19/20 229 lb 6.4 oz (104.1 kg)  08/13/20 228 lb 0.8 oz (103.4 kg)  12/09/19 229 lb (103.9 kg)     Health Maintenance Due  Topic Date Due   PAP SMEAR-Modifier  Never done    There are no preventive care reminders to display for this patient.  Lab Results  Component Value Date   TSH 1.140 08/13/2020   Lab Results  Component Value Date   WBC 10.2 08/13/2020   HGB 12.6 08/13/2020   HCT 42.0 08/13/2020   MCV 72 (L) 08/13/2020   PLT 398 08/13/2020   Lab Results  Component Value Date   NA 135 08/13/2020   K 4.3 08/13/2020   CO2 25 12/09/2019   GLUCOSE 187 (H) 08/13/2020   BUN 7 08/13/2020   CREATININE 0.84 08/13/2020   BILITOT 0.6 08/13/2020   ALKPHOS 76 08/13/2020   AST 20 08/13/2020   ALT 12 12/09/2019   PROT 8.0 08/13/2020   ALBUMIN 4.6 08/13/2020   CALCIUM 9.6 08/13/2020   ANIONGAP 8 09/25/2016   EGFR 89 08/13/2020   Lab Results  Component Value Date   CHOL 193 08/13/2020   Lab Results  Component Value Date   HDL 46 08/13/2020   Lab Results  Component Value Date   LDLCALC 127 (H) 08/13/2020   Lab Results  Component Value Date   TRIG 108 08/13/2020   Lab Results  Component Value Date    CHOLHDL 4.2 08/13/2020   Lab Results  Component Value Date   HGBA1C 9.0 (A) 11/19/2020  HGBA1C 9.0 11/19/2020   HGBA1C 9.0 (A) 11/19/2020   HGBA1C 9.0 (A) 11/19/2020      Assessment & Plan:   Problem List Items Addressed This Visit   None Visit Diagnoses     Uncontrolled type 2 diabetes mellitus with hyperglycemia ([place])    -  Primary Encourage compliance with current treatment regimen  Dose adjustment Metformin 1000 mg Bid  Encourage regular CBG monitoring Encourage contacting office if excessive hyperglycemia and or hypoglycemia Lifestyle modification with healthy diet (fewer calories, more high fiber foods, whole grains and non-starchy vegetables, lower fat meat and fish, low-fat diary include healthy oils) regular exercise (physical activity) and weight loss Opthalmology exam discussed  Nutritional consult recommended Regular dental visits encouraged Home BP monitoring also encouraged goal <130/80 ACE/ARB and statin to be intergreated   Relevant Medications   metFORMIN (GLUCOPHAGE) 1000 MG tablet   Other Relevant Orders   HgB A1c (Completed)   Excessive vaginal bleeding       Relevant Orders   Ambulatory referral to Gynecology   Acute bilateral low back pain with sciatica, sciatica laterality unspecified       Relevant Medications   cyclobenzaprine (FLEXERIL) 5 MG tablet       Meds ordered this encounter  Medications   metFORMIN (GLUCOPHAGE) 1000 MG tablet    Sig: Take 1 tablet (1,000 mg total) by mouth 2 (two) times daily with a meal.    Dispense:  180 tablet    Refill:  3    Order Specific Question:   Supervising Provider    Answer:   Tresa Garter [[id number]]   cyclobenzaprine (FLEXERIL) 5 MG tablet    Sig: Take 1 tablet (5 mg total) by mouth at bedtime.    Dispense:  30 tablet    Refill:  1    Order Specific Question:   Supervising Provider    Answer:   Tresa Garter [id number]     Follow-up: Return in about 3 months (around 02/19/2021) for  follow up DM [id number].    Vevelyn Francois, NP

## 2020-11-19 NOTE — Patient Instructions (Signed)
Diabetes Mellitus and Nutrition, Adult When you have diabetes, or diabetes mellitus, it is very important to have healthy eating habits because your blood sugar (glucose) levels are greatly affected by what you eat and drink. Eating healthy foods in the right amounts, at about the same times every day, can help you:  Control your blood glucose.  Lower your risk of heart disease.  Improve your blood pressure.  Reach or maintain a healthy weight. What can affect my meal plan? Every person with diabetes is different, and each person has different needs for a meal plan. Your health care provider may recommend that you work with a dietitian to make a meal plan that is best for you. Your meal plan may vary depending on factors such as:  The calories you need.  The medicines you take.  Your weight.  Your blood glucose, blood pressure, and cholesterol levels.  Your activity level.  Other health conditions you have, such as heart or kidney disease. How do carbohydrates affect me? Carbohydrates, also called carbs, affect your blood glucose level more than any other type of food. Eating carbs naturally raises the amount of glucose in your blood. Carb counting is a method for keeping track of how many carbs you eat. Counting carbs is important to keep your blood glucose at a healthy level, especially if you use insulin or take certain oral diabetes medicines. It is important to know how many carbs you can safely have in each meal. This is different for every person. Your dietitian can help you calculate how many carbs you should have at each meal and for each snack. How does alcohol affect me? Alcohol can cause a sudden decrease in blood glucose (hypoglycemia), especially if you use insulin or take certain oral diabetes medicines. Hypoglycemia can be a life-threatening condition. Symptoms of hypoglycemia, such as sleepiness, dizziness, and confusion, are similar to symptoms of having too much  alcohol.  Do not drink alcohol if: ? Your health care provider tells you not to drink. ? You are pregnant, may be pregnant, or are planning to become pregnant.  If you drink alcohol: ? Do not drink on an empty stomach. ? Limit how much you use to:  0-1 drink a day for women.  0-2 drinks a day for men. ? Be aware of how much alcohol is in your drink. In the [place] one drink equals one 12 oz bottle of beer (355 mL), one 5 oz glass of wine (148 mL), or one 1 oz glass of hard liquor (44 mL). ? Keep yourself hydrated with water, diet soda, or unsweetened iced tea.  Keep in mind that regular soda, juice, and other mixers may contain a lot of sugar and must be counted as carbs. What are tips for following this plan? Reading food labels  Start by checking the serving size on the "Nutrition Facts" label of packaged foods and drinks. The amount of calories, carbs, fats, and other nutrients listed on the label is based on one serving of the item. Many items contain more than one serving per package.  Check the total grams (g) of carbs in one serving. You can calculate the number of servings of carbs in one serving by dividing the total carbs by 15. For example, if a food has 30 g of total carbs per serving, it would be equal to 2 servings of carbs.  Check the number of grams (g) of saturated fats and trans fats in one serving. Choose foods that have   a low amount or none of these fats.  Check the number of milligrams (mg) of salt (sodium) in one serving. Most people should limit total sodium intake to less than 2,300 mg per day.  Always check the nutrition information of foods labeled as "low-fat" or "nonfat." These foods may be higher in added sugar or refined carbs and should be avoided.  Talk to your dietitian to identify your daily goals for nutrients listed on the label. Shopping  Avoid buying canned, pre-made, or processed foods. These foods tend to be high in fat, sodium, and added  sugar.  Shop around the outside edge of the grocery store. This is where you will most often find fresh fruits and vegetables, bulk grains, fresh meats, and fresh dairy. Cooking  Use low-heat cooking methods, such as baking, instead of high-heat cooking methods like deep frying.  Cook using healthy oils, such as olive, canola, or sunflower oil.  Avoid cooking with butter, cream, or high-fat meats. Meal planning  Eat meals and snacks regularly, preferably at the same times every day. Avoid going long periods of time without eating.  Eat foods that are high in fiber, such as fresh fruits, vegetables, beans, and whole grains. Talk with your dietitian about how many servings of carbs you can eat at each meal.  Eat 4-6 oz (112-168 g) of lean protein each day, such as lean meat, chicken, fish, eggs, or tofu. One ounce (oz) of lean protein is equal to: ? 1 oz (28 g) of meat, chicken, or fish. ? 1 egg. ?  cup (62 g) of tofu.  Eat some foods each day that contain healthy fats, such as avocado, nuts, seeds, and fish.   What foods should I eat? Fruits Berries. Apples. Oranges. Peaches. Apricots. Plums. Grapes. Mango. Papaya. Pomegranate. Kiwi. Cherries. Vegetables Lettuce. Spinach. Leafy greens, including kale, chard, collard greens, and mustard greens. Beets. Cauliflower. Cabbage. Broccoli. Carrots. Green beans. Tomatoes. Peppers. Onions. Cucumbers. Brussels sprouts. Grains Whole grains, such as whole-wheat or whole-grain bread, crackers, tortillas, cereal, and pasta. Unsweetened oatmeal. Quinoa. Brown or wild rice. Meats and other proteins Seafood. Poultry without skin. Lean cuts of poultry and beef. Tofu. Nuts. Seeds. Dairy Low-fat or fat-free dairy products such as milk, yogurt, and cheese. The items listed above may not be a complete list of foods and beverages you can eat. Contact a dietitian for more information. What foods should I avoid? Fruits Fruits canned with  syrup. Vegetables Canned vegetables. Frozen vegetables with butter or cream sauce. Grains Refined white flour and flour products such as bread, pasta, snack foods, and cereals. Avoid all processed foods. Meats and other proteins Fatty cuts of meat. Poultry with skin. Breaded or fried meats. Processed meat. Avoid saturated fats. Dairy Full-fat yogurt, cheese, or milk. Beverages Sweetened drinks, such as soda or iced tea. The items listed above may not be a complete list of foods and beverages you should avoid. Contact a dietitian for more information. Questions to ask a health care provider  Do I need to meet with a diabetes educator?  Do I need to meet with a dietitian?  What number can I call if I have questions?  When are the best times to check my blood glucose? Where to find more information:  American Diabetes Association: diabetes.org  Academy of Nutrition and Dietetics: www.eatright.org  National Institute of Diabetes and Digestive and Kidney Diseases: www.niddk.nih.gov  Association of Diabetes Care and Education Specialists: www.diabeteseducator.org Summary  It is important to have healthy eating   habits because your blood sugar (glucose) levels are greatly affected by what you eat and drink.  A healthy meal plan will help you control your blood glucose and maintain a healthy lifestyle.  Your health care provider may recommend that you work with a dietitian to make a meal plan that is best for you.  Keep in mind that carbohydrates (carbs) and alcohol have immediate effects on your blood glucose levels. It is important to count carbs and to use alcohol carefully. This information is not intended to replace advice given to you by your health care provider. Make sure you discuss any questions you have with your health care provider. Document Revised: 12/24/2018 Document Reviewed: 12/24/2018 Elsevier Patient Education  2021 Elsevier Inc.  

## 2020-11-20 ENCOUNTER — Encounter: Payer: Self-pay | Admitting: Nurse Practitioner

## 2020-11-22 ENCOUNTER — Other Ambulatory Visit: Payer: Self-pay | Admitting: Nurse Practitioner

## 2020-11-22 DIAGNOSIS — R928 Other abnormal and inconclusive findings on diagnostic imaging of breast: Secondary | ICD-10-CM

## 2020-11-25 ENCOUNTER — Other Ambulatory Visit: Payer: 59

## 2020-12-29 ENCOUNTER — Ambulatory Visit (INDEPENDENT_AMBULATORY_CARE_PROVIDER_SITE_OTHER): Payer: 59

## 2020-12-29 ENCOUNTER — Other Ambulatory Visit: Payer: Self-pay

## 2020-12-29 ENCOUNTER — Encounter: Payer: Self-pay | Admitting: Emergency Medicine

## 2020-12-29 ENCOUNTER — Ambulatory Visit
Admission: EM | Admit: 2020-12-29 | Discharge: 2020-12-29 | Disposition: A | Discharge status: Home / Self Care | Payer: 59 | Attending: Physician Assistant | Admitting: Physician Assistant

## 2020-12-29 DIAGNOSIS — S62234A Other nondisplaced fracture of base of first metacarpal bone, right hand, initial encounter for closed fracture: Secondary | ICD-10-CM | POA: Diagnosis not present

## 2020-12-29 DIAGNOSIS — M533 Sacrococcygeal disorders, not elsewhere classified: Secondary | ICD-10-CM

## 2020-12-29 DIAGNOSIS — W19XXXA Unspecified fall, initial encounter: Secondary | ICD-10-CM

## 2020-12-29 DIAGNOSIS — M25572 Pain in left ankle and joints of left foot: Secondary | ICD-10-CM | POA: Diagnosis not present

## 2020-12-29 DIAGNOSIS — M25571 Pain in right ankle and joints of right foot: Secondary | ICD-10-CM | POA: Diagnosis not present

## 2020-12-29 DIAGNOSIS — Z043 Encounter for examination and observation following other accident: Secondary | ICD-10-CM | POA: Diagnosis not present

## 2020-12-29 HISTORY — DX: Type 2 diabetes mellitus without complications: E11.9

## 2020-12-29 NOTE — ED Triage Notes (Signed)
Fell down stairs yesterday. Both ankles hurt, right ankle swollen. Tailbone pain and right hand pain.

## 2020-12-30 NOTE — ED Provider Notes (Signed)
[facility]    CSN: [id number] Arrival date & time: 12/29/20  1817      History   Chief Complaint Chief Complaint  Patient presents with   Fall    HPI [name] is a 40 y.o. female.   Patient here today for evaluation of pain to her tailbone, bilateral ankles and right hand after a fall down steps yesterday. She reports that she has some swelling in her right ankle but did have sprain to same about 7 months ago. She has not had any numbness. She has taken OTC meds with mild relief.   The history is provided by the patient.  Fall Pertinent negatives include no abdominal pain and no shortness of breath.   Past Medical History:  Diagnosis Date   Anemia    Anxiety 03/2019   Diabetes mellitus without complication ([place])    Insomnia 04/07/2019   Kidney stone    Low iron stores 12/2019   Migraine    Vitamin D deficiency 03/2019    Patient Active Problem List   Diagnosis Date Noted   Nonintractable headache 04/10/2019   Anxiety 04/10/2019   Insomnia 04/10/2019    Past Surgical History:  Procedure Laterality Date   CESAREAN SECTION     TUBAL LIGATION Left June 2012   WISDOM TOOTH EXTRACTION      OB History     Gravida  6   Para  3   Term  3   Preterm      AB  3   Living  3      SAB  1   IAB  1   Ectopic  1   Multiple      Live Births               Home Medications    Prior to Admission medications   Medication Sig Start Date End Date Taking? Authorizing Provider  Accu-Chek Softclix Lancets lancets USE UP TO 4 TIMES DAILY AS DIRECTED 11/19/20   Vevelyn Francois, NP  blood glucose meter kit and supplies KIT Dispense based on patient and insurance preference. Use up to four times daily as directed. 08/13/20   Vevelyn Francois, NP  cyclobenzaprine (FLEXERIL) 5 MG tablet Take 1 tablet (5 mg total) by mouth at bedtime. 11/19/20   Vevelyn Francois, NP  ferrous sulfate 325 (65 FE) MG EC tablet Take 1 tablet (325 mg total) by  mouth daily. 08/16/20 08/11/21  Vevelyn Francois, NP  glucose blood (ACCU-CHEK GUIDE) test strip USE UP TO 4 TIMES DAILY AS DIRECTED 11/19/20   Vevelyn Francois, NP  ibuprofen (ADVIL) 800 MG tablet Take 1 tablet (800 mg total) by mouth 3 (three) times daily. 09/17/20   Vevelyn Francois, NP  metFORMIN (GLUCOPHAGE) 1000 MG tablet Take 1 tablet (1,000 mg total) by mouth 2 (two) times daily with a meal. 11/19/20   Vevelyn Francois, NP  nystatin (MYCOSTATIN) 100000 UNIT/ML suspension Take 5 mLs (500,000 Units total) by mouth 4 (four) times daily. 10/14/19   Ok Edwards, PA-C  traZODone (DESYREL) 50 MG tablet TAKE 1 TABLET BY MOUTH EVERYDAY AT BEDTIME 08/16/20   Vevelyn Francois, NP  Vitamin D, Ergocalciferol, (DRISDOL) 1.25 MG (50000 UNIT) CAPS capsule Take 1 capsule (50,000 Units total) by mouth every 7 (seven) days. 08/16/20   Vevelyn Francois, NP  topiramate (TOPAMAX) 25 MG tablet TAKE 1 TABLET BY MOUTH TWICE A DAY 07/01/19 09/24/19  Azzie Glatter,  FNP    Family History Family History  Problem Relation Age of Onset   Diabetes Mother    Hypertension Mother    Healthy Father    Breast cancer Maternal Aunt    Breast cancer Maternal Grandmother    Diabetes Maternal Grandmother    Hypertension Maternal Grandmother    Cancer Maternal Grandmother    Diabetes Maternal Grandfather    Hypertension Maternal Grandfather    Breast cancer Cousin     Social History Social History   Tobacco Use   Smoking status: Never   Smokeless tobacco: Never  Vaping Use   Vaping Use: Never used  Substance Use Topics   Alcohol use: No   Drug use: No     Allergies   Patient has no known allergies.   Review of Systems Review of Systems  Constitutional:  Negative for chills and fever.  Eyes:  Negative for discharge and redness.  Respiratory:  Negative for shortness of breath.   Gastrointestinal:  Negative for abdominal pain, nausea and vomiting.  Genitourinary:  Positive for vaginal bleeding and vaginal discharge.   Musculoskeletal:  Positive for arthralgias and joint swelling.  Neurological:  Negative for numbness.    Physical Exam Triage Vital Signs ED Triage Vitals [12/29/20 1835]  Enc Vitals Yoder     BP 127/86     Pulse Rate 78     Resp 16     Temp 98.1 F (36.7 C)     Temp Source Oral     SpO2 98 %     Weight      Height      Head Circumference      Peak Flow      Pain Score 10     Pain Loc      Pain Edu?      Excl. in [place]?    No data found.  Updated Vital Signs BP 127/86 (BP Location: Left Arm)   Pulse 78   Temp 98.1 F (36.7 C) (Oral)   Resp 16   SpO2 98%      Physical Exam Vitals and nursing note reviewed.  Constitutional:      General: She is not in acute distress.    Appearance: Normal appearance. She is not ill-appearing.  HENT:     Head: Normocephalic and atraumatic.  Eyes:     Conjunctiva/sclera: Conjunctivae normal.  Cardiovascular:     Rate and Rhythm: Normal rate.  Pulmonary:     Effort: Pulmonary effort is normal.  Musculoskeletal:     Comments: Mild TTP noted to bilateral lateral malleolus with some mild swelling noted diffusely to right ankle. Full ROM of bilateral ankles.   Mild bruising to thenar imminence with TTP to 1st metacarpal. Full ROM of right fingers  Neurological:     Mental Status: She is alert.     Comments: Gross sensation intact to right fingers distally  Psychiatric:        Mood and Affect: Mood normal.        Behavior: Behavior normal.        Thought Content: Thought content normal.     UC Treatments / Results  Labs (all labs ordered are listed, but only abnormal results are displayed) Labs Reviewed - No data to display  EKG   Radiology DG Sacrum/Coccyx  Result Date: 12/29/2020 CLINICAL DATA:  [place] down stairs EXAM: SACRUM AND COCCYX - 2+ VIEW COMPARISON:  CT 09/25/2016 FINDINGS: There is no evidence of fracture or other focal  bone lesions. Minimal chronic deformity at the sacrococcygeal region. IMPRESSION: No acute  osseous abnormality Electronically Signed   By: Donavan Foil M.D.   On: 12/29/2020 19:25   DG Ankle Complete Left  Result Date: 12/29/2020 CLINICAL DATA:  [place] EXAM: LEFT ANKLE COMPLETE - 3+ VIEW COMPARISON:  05/20/2020 FINDINGS: No fracture or malalignment. Ossicle adjacent to the tip of medial malleolus. Ankle mortise is symmetric. IMPRESSION: No acute osseous abnormality Electronically Signed   By: Donavan Foil M.D.   On: 12/29/2020 19:23   DG Ankle Complete Right  Result Date: 12/29/2020 CLINICAL DATA:  [place] down stairs EXAM: RIGHT ANKLE - COMPLETE 3+ VIEW COMPARISON:  05/20/2020 FINDINGS: No acute displaced fracture or malalignment. Chronic ossicle or fracture adjacent to the medial malleolar tip. Ankle mortise is symmetric. IMPRESSION: No acute osseous abnormality Electronically Signed   By: Donavan Foil M.D.   On: 12/29/2020 19:22   DG Hand Complete Right  Result Date: 12/29/2020 CLINICAL DATA:  Fall EXAM: RIGHT HAND - COMPLETE 3+ VIEW COMPARISON:  None. FINDINGS: Possible small intra-articular fracture at the base of the first metacarpal, correlate for point tenderness. No malalignment. IMPRESSION: Questionable intra-articular fracture at the base of first metacarpal, correlate for point tenderness. Electronically Signed   By: Donavan Foil M.D.   On: 12/29/2020 19:21    Procedures Procedures (including critical care time)  Medications Ordered in UC Medications - No data to display  Initial Impression / Assessment and Plan / UC Course  I have reviewed the triage vital signs and the nursing notes.  Pertinent labs & imaging results that were available during my care of the patient were reviewed by me and considered in my medical decision making (see chart for details).    Xrays normal with exception of possible fracture to right first  MTP. Splint applied in office and recommended follow up with ortho. Encouraged ibuprofen if needed for pain as well as RICE therapy for  ankle(s). Recommend follow up with any further concerns.   Final Clinical Impressions(s) / UC Diagnoses   Final diagnoses:  Fall, initial encounter  Acute bilateral ankle pain  Pain in the coccyx  Closed nondisplaced fracture of base of first metacarpal bone of right hand, unspecified fracture morphology, initial encounter   Discharge Instructions   None    ED Prescriptions   None    PDMP not reviewed this encounter.   Francene Finders, PA-C 12/30/20 [phone]

## 2021-01-21 ENCOUNTER — Other Ambulatory Visit: Payer: Self-pay | Admitting: Nurse Practitioner

## 2021-01-21 DIAGNOSIS — R928 Other abnormal and inconclusive findings on diagnostic imaging of breast: Secondary | ICD-10-CM

## 2021-01-21 NOTE — Progress Notes (Unsigned)
   [facility] [address] [place], [place]  [address] Phone:  [phone]   Fax:  [phone] 

## 2021-02-21 ENCOUNTER — Ambulatory Visit (INDEPENDENT_AMBULATORY_CARE_PROVIDER_SITE_OTHER): Payer: 59 | Admitting: Nurse Practitioner

## 2021-02-21 ENCOUNTER — Encounter: Payer: Self-pay | Admitting: Nurse Practitioner

## 2021-02-21 ENCOUNTER — Other Ambulatory Visit: Payer: Self-pay

## 2021-02-21 VITALS — BP 124/70 | HR 87 | Temp 97.6°F | Ht 67.0 in | Wt 226.0 lb

## 2021-02-21 DIAGNOSIS — E1165 Type 2 diabetes mellitus with hyperglycemia: Secondary | ICD-10-CM | POA: Diagnosis not present

## 2021-02-21 LAB — POCT GLYCOSYLATED HEMOGLOBIN (HGB A1C)
HbA1c POC (<> result, manual entry): 8.7 % (ref 4.0–5.6)
HbA1c, POC (controlled diabetic range): 8.7 % — AB (ref 0.0–7.0)
HbA1c, POC (prediabetic range): 8.7 % — AB (ref 5.7–6.4)
Hemoglobin A1C: 8.7 % — AB (ref 4.0–5.6)

## 2021-02-21 MED ORDER — GLIPIZIDE 5 MG PO TABS: 5.0000 mg | ORAL_TABLET | Freq: Two times a day (BID) | ORAL | 1 refills | Status: DC

## 2021-02-21 NOTE — Patient Instructions (Signed)

## 2021-02-21 NOTE — Progress Notes (Signed)
[place], [place]  [address] Phone:  [phone]   Fax:  [phone]   Established Patient Office Visit  Subjective:  Patient ID: [name], female    DOB: Dec 14, 1980  Age: 41 y.o. MRN: [id number]  CC:  Chief Complaint  Patient presents with   Follow-up    Pt is here today for her 3 month follow up visit.      HPI [name] presents for follow up. She  has a past medical history of Anemia, Anxiety (03/2019), Diabetes mellitus without complication ([place]), Insomnia (04/07/2019), Kidney stone, Low iron stores (12/2019), Migraine, and Vitamin D deficiency (03/2019).   Diabetes Mellitus Patient presents for follow up of diabetes. Current symptoms include: hyperglycemia. Symptoms have stabilized. Patient denies foot ulcerations, hypoglycemia , increased appetite, nausea, paresthesia of the feet, polydipsia, polyuria, visual disturbances, vomiting, and weight loss. Evaluation to date has included: fasting blood sugar.  Home sugars:  varies . Current treatment: Continued metformin which has been somewhat effective.    Past Medical History:  Diagnosis Date   Anemia    Anxiety 03/2019   Diabetes mellitus without complication (HCC)    Insomnia 04/07/2019   Kidney stone    Low iron stores 12/2019   Migraine    Vitamin D deficiency 03/2019    Past Surgical History:  Procedure Laterality Date   CESAREAN SECTION     TUBAL LIGATION Left June 2012   WISDOM TOOTH EXTRACTION      Family History  Problem Relation Age of Onset   Diabetes Mother    Hypertension Mother    Healthy Father    Breast cancer Maternal Aunt    Breast cancer Maternal Grandmother    Diabetes Maternal Grandmother    Hypertension Maternal Grandmother    Cancer Maternal Grandmother    Diabetes Maternal Grandfather    Hypertension Maternal Grandfather    Breast cancer Cousin     Social History   Socioeconomic History   Marital status: Single     Spouse name: Not on file   Number of children: Not on file   Years of education: Not on file   Highest education level: Not on file  Occupational History   Not on file  Tobacco Use   Smoking status: Never   Smokeless tobacco: Never  Vaping Use   Vaping Use: Never used  Substance and Sexual Activity   Alcohol use: No   Drug use: No   Sexual activity: Yes    Birth control/protection: Condom  Other Topics Concern   Not on file  Social History Narrative   Not on file   Social Determinants of Health   Financial Resource Strain: Not on file  Food Insecurity: Not on file  Transportation Needs: Not on file  Physical Activity: Not on file  Stress: Not on file  Social Connections: Not on file  Intimate Partner Violence: Not on file    Outpatient Medications Prior to Visit  Medication Sig Dispense Refill   Accu-Chek Softclix Lancets lancets USE UP TO 4 TIMES DAILY AS DIRECTED 100 each 1   blood glucose meter kit and supplies KIT Dispense based on patient and insurance preference. Use up to four times daily as directed. 1 each 0   cyclobenzaprine (FLEXERIL) 5 MG tablet Take 1 tablet (5 mg total) by mouth at bedtime. 30 tablet 1   ferrous sulfate 325 (65 FE) MG EC tablet Take 1 tablet (325 mg total)  by mouth daily. 30 tablet 11   glucose blood (ACCU-CHEK GUIDE) test strip USE UP TO 4 TIMES DAILY AS DIRECTED 100 strip 1   ibuprofen (ADVIL) 800 MG tablet Take 1 tablet (800 mg total) by mouth 3 (three) times daily. 21 tablet 0   metFORMIN (GLUCOPHAGE) 1000 MG tablet Take 1 tablet (1,000 mg total) by mouth 2 (two) times daily with a meal. 180 tablet 3   nystatin (MYCOSTATIN) 100000 UNIT/ML suspension Take 5 mLs (500,000 Units total) by mouth 4 (four) times daily. 60 mL 0   traZODone (DESYREL) 50 MG tablet TAKE 1 TABLET BY MOUTH EVERYDAY AT BEDTIME 90 tablet 3   Vitamin D, Ergocalciferol, (DRISDOL) 1.25 MG (50000 UNIT) CAPS capsule Take 1 capsule (50,000 Units total) by mouth every 7 (seven)  days. 5 capsule 6   No facility-administered medications prior to visit.    No Known Allergies  ROS Review of Systems    Objective:    Physical Exam Constitutional:      General: She is not in acute distress.    Appearance: She is obese. She is not ill-appearing, toxic-appearing or diaphoretic.  HENT:     Head: Normocephalic and atraumatic.     Nose: Nose normal.     Mouth/Throat:     Mouth: Mucous membranes are moist.  Cardiovascular:     Rate and Rhythm: Normal rate and regular rhythm.     Pulses: Normal pulses.     Heart sounds: Normal heart sounds.  Pulmonary:     Effort: Pulmonary effort is normal.     Breath sounds: Normal breath sounds.  Abdominal:     General: Bowel sounds are normal.  Musculoskeletal:        General: Normal range of motion.     Cervical back: Normal range of motion.  Skin:    General: Skin is warm and dry.     Capillary Refill: Capillary refill takes less than 2 seconds.  Neurological:     General: No focal deficit present.     Mental Status: She is alert and oriented to person, place, and time.  Psychiatric:        Mood and Affect: Mood normal.        Behavior: Behavior normal.        Thought Content: Thought content normal.        Judgment: Judgment normal.    BP 124/70    Pulse 87    Temp 97.6 F (36.4 C)    Ht _0  (1.702 m)    Wt 226 lb (102.5 kg)    SpO2 98%    BMI 35.40 kg/m  Wt Readings from Last 3 Encounters:  02/21/21 226 lb (102.5 kg)  11/19/20 229 lb 6.4 oz (104.1 kg)  08/13/20 228 lb 0.8 oz (103.4 kg)     Health Maintenance Due  Topic Date Due   PAP SMEAR-Modifier  Never done   COVID-19 Vaccine (2 - Pfizer series) 12/17/2019    There are no preventive care reminders to display for this patient.  Lab Results  Component Value Date   TSH 1.140 08/13/2020   Lab Results  Component Value Date   WBC 10.2 08/13/2020   HGB 12.6 08/13/2020   HCT 42.0 08/13/2020   MCV 72 (L) 08/13/2020   PLT 398 08/13/2020   Lab  Results  Component Value Date   NA 135 08/13/2020   K 4.3 08/13/2020   CO2 25 12/09/2019   GLUCOSE 187 (H) 08/13/2020  BUN 7 08/13/2020   CREATININE 0.84 08/13/2020   BILITOT 0.6 08/13/2020   ALKPHOS 76 08/13/2020   AST 20 08/13/2020   ALT 12 12/09/2019   PROT 8.0 08/13/2020   ALBUMIN 4.6 08/13/2020   CALCIUM 9.6 08/13/2020   ANIONGAP 8 09/25/2016   EGFR 89 08/13/2020   Lab Results  Component Value Date   CHOL 193 08/13/2020   Lab Results  Component Value Date   HDL 46 08/13/2020   Lab Results  Component Value Date   LDLCALC 127 (H) 08/13/2020   Lab Results  Component Value Date   TRIG 108 08/13/2020   Lab Results  Component Value Date   CHOLHDL 4.2 08/13/2020   Lab Results  Component Value Date   HGBA1C 8.7 (A) 02/21/2021   HGBA1C 8.7 02/21/2021   HGBA1C 8.7 (A) 02/21/2021   HGBA1C 8.7 (A) 02/21/2021      Assessment & Plan:   Problem List Items Addressed This Visit   None Visit Diagnoses     Uncontrolled type 2 diabetes mellitus with hyperglycemia ([place])    -  Primary Started Glipizide Encourage compliance with current treatment regimen  Encourage regular CBG monitoring Encourage contacting office if excessive hyperglycemia and or hypoglycemia Lifestyle modification with healthy diet (fewer calories, more high fiber foods, whole grains and non-starchy vegetables, lower fat meat and fish, low-fat diary include healthy oils) regular exercise (physical activity) and weight loss Opthalmology exam discussed  Home BP monitoring also encouraged goal <130/80    Relevant Medications   glipiZIDE (GLUCOTROL) 5 MG tablet   Other Relevant Orders   HgB A1c (Completed)   Comp. Metabolic Panel (12)       Meds ordered this encounter  Medications   glipiZIDE (GLUCOTROL) 5 MG tablet    Sig: Take 1 tablet (5 mg total) by mouth 2 (two) times daily before a meal.    Dispense:  180 tablet    Refill:  1    Order Specific Question:   Supervising Provider     AnswerTresa Garter [id number]    Follow-up: Return in about 6 weeks (around 04/04/2021) for [place] [[id number]].    Vevelyn Francois, NP

## 2021-02-22 LAB — COMP. METABOLIC PANEL (12)
AST: 16 IU/L (ref 0–40)
Albumin/Globulin Ratio: 1.5 (ref 1.2–2.2)
Albumin: 4.7 g/dL (ref 3.8–4.8)
Alkaline Phosphatase: 70 IU/L (ref 44–121)
BUN/Creatinine Ratio: 8 — ABNORMAL LOW (ref 9–23)
BUN: 7 mg/dL (ref 6–24)
Bilirubin Total: 0.4 mg/dL (ref 0.0–1.2)
Calcium: 9.2 mg/dL (ref 8.7–10.2)
Chloride: 103 mmol/L (ref 96–106)
Creatinine, Ser: 0.85 mg/dL (ref 0.57–1.00)
Globulin, Total: 3.2 g/dL (ref 1.5–4.5)
Glucose: 153 mg/dL — ABNORMAL HIGH (ref 70–99)
Potassium: 4.3 mmol/L (ref 3.5–5.2)
Sodium: 137 mmol/L (ref 134–144)
Total Protein: 7.9 g/dL (ref 6.0–8.5)
eGFR: 88 mL/min/{1.73_m2} (ref >59)

## 2021-02-25 ENCOUNTER — Telehealth: Payer: Self-pay

## 2021-02-25 NOTE — Telephone Encounter (Signed)
-----   Message from Ashley L Long, CMA sent at 02/24/2021  2:44 PM EST ----- ° °----- Message ----- °From: King, [name] M, NP °Sent: 02/23/2021   8:37 AM EST °To: Ashley L Long, CMA ° °Overall your labs are stable.  ° °

## 2021-02-25 NOTE — Telephone Encounter (Signed)
Called patient reviewed all information and repeated back to me. Will call if any questions.  ? ?

## 2021-02-25 NOTE — Telephone Encounter (Signed)
Left message to return call to our office.  

## 2021-02-25 NOTE — Telephone Encounter (Signed)
-----   Message from Demetrius Charity, [place] sent at 02/24/2021  2:44 PM EST -----  ----- Message ----- From: Barbette Merino, NP Sent: 02/23/2021   8:37 AM EST To: Demetrius Charity, CMA  Overall your labs are stable.

## 2021-03-05 ENCOUNTER — Other Ambulatory Visit: Payer: 59

## 2021-03-17 ENCOUNTER — Other Ambulatory Visit: Payer: 59

## 2021-04-04 ENCOUNTER — Encounter: Payer: 59 | Admitting: Nurse Practitioner

## 2021-05-09 ENCOUNTER — Other Ambulatory Visit: Payer: Self-pay

## 2021-05-25 ENCOUNTER — Ambulatory Visit (INDEPENDENT_AMBULATORY_CARE_PROVIDER_SITE_OTHER): Payer: 59 | Admitting: Nurse Practitioner

## 2021-05-25 VITALS — BP 124/82 | HR 87 | Temp 98.4°F | Ht 67.0 in | Wt 224.8 lb

## 2021-05-25 DIAGNOSIS — M62838 Other muscle spasm: Secondary | ICD-10-CM

## 2021-05-25 DIAGNOSIS — E1165 Type 2 diabetes mellitus with hyperglycemia: Secondary | ICD-10-CM | POA: Diagnosis not present

## 2021-05-25 DIAGNOSIS — J302 Other seasonal allergic rhinitis: Secondary | ICD-10-CM | POA: Diagnosis not present

## 2021-05-25 DIAGNOSIS — F419 Anxiety disorder, unspecified: Secondary | ICD-10-CM | POA: Diagnosis not present

## 2021-05-25 DIAGNOSIS — G4700 Insomnia, unspecified: Secondary | ICD-10-CM

## 2021-05-25 LAB — POCT GLYCOSYLATED HEMOGLOBIN (HGB A1C)
HbA1c POC (<> result, manual entry): 9.1 % (ref 4.0–5.6)
HbA1c, POC (controlled diabetic range): 9.1 % — AB (ref 0.0–7.0)
HbA1c, POC (prediabetic range): 9.1 % — AB (ref 5.7–6.4)
Hemoglobin A1C: 9.1 % — AB (ref 4.0–5.6)

## 2021-05-25 MED ORDER — CYCLOBENZAPRINE HCL 5 MG PO TABS: 5.0000 mg | ORAL_TABLET | Freq: Every day | ORAL | 1 refills | Status: DC

## 2021-05-25 MED ORDER — ESCITALOPRAM OXALATE 10 MG PO TABS: 10.0000 mg | ORAL_TABLET | Freq: Every day | ORAL | 0 refills | Status: DC

## 2021-05-25 MED ORDER — TRAZODONE HCL 100 MG PO TABS: 100.0000 mg | ORAL_TABLET | Freq: Every day | ORAL | 0 refills | Status: DC

## 2021-05-25 MED ORDER — BLOOD GLUCOSE MONITOR KIT: PACK | 0 refills | Status: DC

## 2021-05-25 MED ORDER — CETIRIZINE HCL 10 MG PO TABS: 10.0000 mg | ORAL_TABLET | Freq: Every day | ORAL | 11 refills | Status: AC

## 2021-05-25 MED ORDER — METFORMIN HCL 1000 MG PO TABS: 1000.0000 mg | ORAL_TABLET | Freq: Two times a day (BID) | ORAL | 3 refills | Status: DC

## 2021-05-25 NOTE — Progress Notes (Addendum)
_0  ID: [name], female    DOB: [birth year], 41 y.o.   MRN: [id number]  Chief Complaint  Patient presents with   Follow-up    Pt is here for check up. Pt stated she need refill on all her medications pt stated she need a new blood glucose meter kit. Pt is requesting cetirizine for allergies. Pt will do pap smear next visit    Referring provider: Vevelyn Francois, NP   HPI  [name] presents for follow up. She  has a past medical history of Anemia, Anxiety (03/2019), Diabetes mellitus without complication ([place]), Insomnia (04/07/2019), Kidney stone, Low iron stores (12/2019), Migraine, and Vitamin D deficiency (03/2019).   Patient presents today for diabetes follow-up.  Patient is currently taking Glucophage and glipizide.  Patient is compliant with medications.  She is trying to stay active.  She states that overall she has been doing well since her last visit here with [name] this past January.  Patient states that she does need a new glucometer.  Patient does have a new concern today with anxiety.  She would like to be placed on medication for this.  She states that her job is causing extreme anxiety.  She is currently looking for a new job.  We discussed that we can place her on some medication we do have counseling available if needed.  Patient also states that she has been having increased issues with insomnia due to increased anxiety.  She would like to increase her dosage of trazodone.  We discussed that we can do this today we will refer her to sleep medicine for further evaluation of insomnia.  Patient may need sleep study.  Denies f/c/s, n/v/d, hemoptysis, PND, chest pain or edema.     No Known Allergies  Immunization History  Administered Date(s) Administered   PFIZER(Purple Top)SARS-COV-2 Vaccination 11/26/2019    Past Medical History:  Diagnosis Date   Anemia    Anxiety 03/2019   Diabetes mellitus without complication ([place])    Insomnia 04/07/2019    Kidney stone    Low iron stores 12/2019   Migraine    Vitamin D deficiency 03/2019    Tobacco History: Social History   Tobacco Use  Smoking Status Never  Smokeless Tobacco Never   Counseling given: Not Answered   Outpatient Encounter Medications as of 05/25/2021  Medication Sig   Accu-Chek Softclix Lancets lancets USE UP TO 4 TIMES DAILY AS DIRECTED   cetirizine (ZYRTEC) 10 MG tablet Take 1 tablet (10 mg total) by mouth daily.   escitalopram (LEXAPRO) 10 MG tablet Take 1 tablet (10 mg total) by mouth daily.   ferrous sulfate 325 (65 FE) MG EC tablet Take 1 tablet (325 mg total) by mouth daily.   glipiZIDE (GLUCOTROL) 5 MG tablet Take 1 tablet (5 mg total) by mouth 2 (two) times daily before a meal.   glucose blood (ACCU-CHEK GUIDE) test strip USE UP TO 4 TIMES DAILY AS DIRECTED   ibuprofen (ADVIL) 800 MG tablet Take 1 tablet (800 mg total) by mouth 3 (three) times daily.   nystatin (MYCOSTATIN) 100000 UNIT/ML suspension Take 5 mLs (500,000 Units total) by mouth 4 (four) times daily.   traZODone (DESYREL) 100 MG tablet Take 1 tablet (100 mg total) by mouth at bedtime.   Vitamin D, Ergocalciferol, (DRISDOL) 1.25 MG (50000 UNIT) CAPS capsule Take 1 capsule (50,000 Units total) by mouth every 7 (seven) days.   [DISCONTINUED] blood glucose meter kit and supplies KIT Dispense based  on patient and insurance preference. Use up to four times daily as directed.   [DISCONTINUED] cyclobenzaprine (FLEXERIL) 5 MG tablet Take 1 tablet (5 mg total) by mouth at bedtime.   [DISCONTINUED] metFORMIN (GLUCOPHAGE) 1000 MG tablet Take 1 tablet (1,000 mg total) by mouth 2 (two) times daily with a meal.   [DISCONTINUED] traZODone (DESYREL) 50 MG tablet TAKE 1 TABLET BY MOUTH EVERYDAY AT BEDTIME   blood glucose meter kit and supplies KIT Dispense based on patient and insurance preference. Use up to four times daily as directed.   cyclobenzaprine (FLEXERIL) 5 MG tablet Take 1 tablet (5 mg total) by mouth at  bedtime.   metFORMIN (GLUCOPHAGE) 1000 MG tablet Take 1 tablet (1,000 mg total) by mouth 2 (two) times daily with a meal.   [DISCONTINUED] topiramate (TOPAMAX) 25 MG tablet TAKE 1 TABLET BY MOUTH TWICE A DAY   No facility-administered encounter medications on file as of 05/25/2021.     Review of Systems  Review of Systems  Constitutional: Negative.   HENT: Negative.    Cardiovascular: Negative.   Gastrointestinal: Negative.   Allergic/Immunologic: Negative.   Neurological: Negative.   Psychiatric/Behavioral: Negative.        Physical Exam  BP 124/82 (BP Location: Left Arm, Patient Position: Sitting, Cuff Size: Large)   Pulse 87   Temp 98.4 F (36.9 C)   Ht _0  (1.702 m)   Wt 224 lb 12.8 oz (102 kg)   SpO2 99%   BMI 35.21 kg/m   Wt Readings from Last 5 Encounters:  05/25/21 224 lb 12.8 oz (102 kg)  02/21/21 226 lb (102.5 kg)  11/19/20 229 lb 6.4 oz (104.1 kg)  08/13/20 228 lb 0.8 oz (103.4 kg)  12/09/19 229 lb (103.9 kg)     Physical Exam Vitals and nursing note reviewed.  Constitutional:      General: She is not in acute distress.    Appearance: She is well-developed.  Cardiovascular:     Rate and Rhythm: Normal rate and regular rhythm.  Pulmonary:     Effort: Pulmonary effort is normal.     Breath sounds: Normal breath sounds.  Neurological:     Mental Status: She is alert and oriented to person, place, and time.     Lab Results:  CBC    Component Value Date/Time   WBC 10.8 05/25/2021 1618   WBC 7.8 09/25/2016 0857   RBC 5.80 (H) 05/25/2021 1618   RBC 5.63 (H) 09/25/2016 0857   HGB 12.6 05/25/2021 1618   HCT 40.8 05/25/2021 1618   PLT 426 05/25/2021 1618   MCV 70 (L) 05/25/2021 1618   MCH 21.7 (L) 05/25/2021 1618   MCH 21.1 (L) 09/25/2016 0857   MCHC 30.9 (L) 05/25/2021 1618   MCHC 30.2 09/25/2016 0857   RDW 13.6 05/25/2021 1618   LYMPHSABS 3.6 (H) 08/13/2020 1133   MONOABS 0.5 06/15/2013 1952   EOSABS 0.3 08/13/2020 1133   BASOSABS 0.1  08/13/2020 1133    BMET    Component Value Date/Time   NA 139 05/25/2021 1618   K 4.8 05/25/2021 1618   CL 102 05/25/2021 1618   CO2 25 05/25/2021 1618   GLUCOSE 129 (H) 05/25/2021 1618   GLUCOSE 145 (H) 09/25/2016 0857   BUN 7 05/25/2021 1618   CREATININE 0.76 05/25/2021 1618   CALCIUM 9.7 05/25/2021 1618   GFRNONAA 90 12/09/2019 1319   GFRAA 104 12/09/2019 1319    BNP No results found for: BNP  ProBNP No  results found for: PROBNP  Imaging: No results found.   Assessment & Plan:   Uncontrolled type 2 diabetes mellitus with hyperglycemia (HCC)  Lab Results  Component Value Date   HGBA1C 9.1 (A) 05/25/2021   HGBA1C 9.1 05/25/2021   HGBA1C 9.1 (A) 05/25/2021   HGBA1C 9.1 (A) 05/25/2021     - POCT glycosylated hemoglobin (Hb A1C) - metFORMIN (GLUCOPHAGE) 1000 MG tablet; Take 1 tablet (1,000 mg total) by mouth 2 (two) times daily with a meal.  Dispense: 180 tablet; Refill: 3 - cyclobenzaprine (FLEXERIL) 5 MG tablet; Take 1 tablet (5 mg total) by mouth at bedtime.  Dispense: 30 tablet; Refill: 1 - CBC - Comprehensive metabolic panel  2. Night muscle spasms  - cyclobenzaprine (FLEXERIL) 5 MG tablet; Take 1 tablet (5 mg total) by mouth at bedtime.  Dispense: 30 tablet; Refill: 1  3. Seasonal allergies  - cetirizine (ZYRTEC) 10 MG tablet; Take 1 tablet (10 mg total) by mouth daily.  Dispense: 30 tablet; Refill: 11  4. Anxiety  - escitalopram (LEXAPRO) 10 MG tablet; Take 1 tablet (10 mg total) by mouth daily.  Dispense: 30 tablet; Refill: 0  5. Insomnia, unspecified type  - traZODone (DESYREL) 100 MG tablet; Take 1 tablet (100 mg total) by mouth at bedtime.  Dispense: 90 tablet; Refill: 0 - Ambulatory referral to Pulmonology  Follow up:  Follow up in 3 months or sooner if needed     Fenton Foy, NP 05/26/2021

## 2021-05-25 NOTE — Patient Instructions (Signed)
1. Uncontrolled type 2 diabetes mellitus with hyperglycemia (HCC) ? ?Lab Results  ?Component Value Date  ? HGBA1C 9.1 (A) 05/25/2021  ? HGBA1C 9.1 05/25/2021  ? HGBA1C 9.1 (A) 05/25/2021  ? HGBA1C 9.1 (A) 05/25/2021  ? ? ? ?- POCT glycosylated hemoglobin (Hb A1C) ?- metFORMIN (GLUCOPHAGE) 1000 MG tablet; Take 1 tablet (1,000 mg total) by mouth 2 (two) times daily with a meal.  Dispense: 180 tablet; Refill: 3 ?- cyclobenzaprine (FLEXERIL) 5 MG tablet; Take 1 tablet (5 mg total) by mouth at bedtime.  Dispense: 30 tablet; Refill: 1 ?- CBC ?- Comprehensive metabolic panel ? ?2. Night muscle spasms ? ?- cyclobenzaprine (FLEXERIL) 5 MG tablet; Take 1 tablet (5 mg total) by mouth at bedtime.  Dispense: 30 tablet; Refill: 1 ? ?3. Seasonal allergies ? ?- cetirizine (ZYRTEC) 10 MG tablet; Take 1 tablet (10 mg total) by mouth daily.  Dispense: 30 tablet; Refill: 11 ? ?4. Anxiety ? ?- escitalopram (LEXAPRO) 10 MG tablet; Take 1 tablet (10 mg total) by mouth daily.  Dispense: 30 tablet; Refill: 0 ? ?5. Insomnia, unspecified type ? ?- traZODone (DESYREL) 100 MG tablet; Take 1 tablet (100 mg total) by mouth at bedtime.  Dispense: 90 tablet; Refill: 0 ?- Ambulatory referral to Pulmonology ? ?Follow up: ? ?Follow up in 3 months or sooner if needed ? ?

## 2021-05-26 ENCOUNTER — Encounter: Payer: Self-pay | Admitting: Nurse Practitioner

## 2021-05-26 DIAGNOSIS — E1165 Type 2 diabetes mellitus with hyperglycemia: Secondary | ICD-10-CM | POA: Insufficient documentation

## 2021-05-26 LAB — COMPREHENSIVE METABOLIC PANEL
ALT: 13 IU/L (ref 0–32)
AST: 16 IU/L (ref 0–40)
Albumin/Globulin Ratio: 1.3 (ref 1.2–2.2)
Albumin: 4.5 g/dL (ref 3.8–4.8)
Alkaline Phosphatase: 75 IU/L (ref 44–121)
BUN/Creatinine Ratio: 9 (ref 9–23)
BUN: 7 mg/dL (ref 6–24)
Bilirubin Total: 0.4 mg/dL (ref 0.0–1.2)
CO2: 25 mmol/L (ref 20–29)
Calcium: 9.7 mg/dL (ref 8.7–10.2)
Chloride: 102 mmol/L (ref 96–106)
Creatinine, Ser: 0.76 mg/dL (ref 0.57–1.00)
Globulin, Total: 3.5 g/dL (ref 1.5–4.5)
Glucose: 129 mg/dL — ABNORMAL HIGH (ref 70–99)
Potassium: 4.8 mmol/L (ref 3.5–5.2)
Sodium: 139 mmol/L (ref 134–144)
Total Protein: 8 g/dL (ref 6.0–8.5)
eGFR: 100 mL/min/{1.73_m2} (ref >59)

## 2021-05-26 LAB — CBC
Hematocrit: 40.8 % (ref 34.0–46.6)
Hemoglobin: 12.6 g/dL (ref 11.1–15.9)
MCH: 21.7 pg — ABNORMAL LOW (ref 26.6–33.0)
MCHC: 30.9 g/dL — ABNORMAL LOW (ref 31.5–35.7)
MCV: 70 fL — ABNORMAL LOW (ref 79–97)
Platelets: 426 10*3/uL (ref 150–450)
RBC: 5.8 x10E6/uL — ABNORMAL HIGH (ref 3.77–5.28)
RDW: 13.6 % (ref 11.7–15.4)
WBC: 10.8 10*3/uL (ref 3.4–10.8)

## 2021-05-26 NOTE — Assessment & Plan Note (Signed)
?  Lab Results  ?Component Value Date  ? HGBA1C 9.1 (A) 05/25/2021  ? HGBA1C 9.1 05/25/2021  ? HGBA1C 9.1 (A) 05/25/2021  ? HGBA1C 9.1 (A) 05/25/2021  ? ? ? ?- POCT glycosylated hemoglobin (Hb A1C) ?- metFORMIN (GLUCOPHAGE) 1000 MG tablet; Take 1 tablet (1,000 mg total) by mouth 2 (two) times daily with a meal.  Dispense: 180 tablet; Refill: 3 ?- cyclobenzaprine (FLEXERIL) 5 MG tablet; Take 1 tablet (5 mg total) by mouth at bedtime.  Dispense: 30 tablet; Refill: 1 ?- CBC ?- Comprehensive metabolic panel ? ?2. Night muscle spasms ? ?- cyclobenzaprine (FLEXERIL) 5 MG tablet; Take 1 tablet (5 mg total) by mouth at bedtime.  Dispense: 30 tablet; Refill: 1 ? ?3. Seasonal allergies ? ?- cetirizine (ZYRTEC) 10 MG tablet; Take 1 tablet (10 mg total) by mouth daily.  Dispense: 30 tablet; Refill: 11 ? ?4. Anxiety ? ?- escitalopram (LEXAPRO) 10 MG tablet; Take 1 tablet (10 mg total) by mouth daily.  Dispense: 30 tablet; Refill: 0 ? ?5. Insomnia, unspecified type ? ?- traZODone (DESYREL) 100 MG tablet; Take 1 tablet (100 mg total) by mouth at bedtime.  Dispense: 90 tablet; Refill: 0 ?- Ambulatory referral to Pulmonology ? ?Follow up: ? ?Follow up in 3 months or sooner if needed ?

## 2021-06-19 ENCOUNTER — Other Ambulatory Visit: Payer: Self-pay | Admitting: Nurse Practitioner

## 2021-06-19 DIAGNOSIS — E1165 Type 2 diabetes mellitus with hyperglycemia: Secondary | ICD-10-CM

## 2021-06-23 ENCOUNTER — Institutional Professional Consult (permissible substitution): Payer: 59 | Admitting: Pulmonary Disease

## 2021-06-30 ENCOUNTER — Ambulatory Visit: Payer: 59 | Admitting: Nurse Practitioner

## 2021-07-01 ENCOUNTER — Institutional Professional Consult (permissible substitution): Payer: 59 | Admitting: Adult Health

## 2021-07-10 ENCOUNTER — Other Ambulatory Visit: Payer: Self-pay | Admitting: Nurse Practitioner

## 2021-07-10 DIAGNOSIS — E1165 Type 2 diabetes mellitus with hyperglycemia: Secondary | ICD-10-CM

## 2021-07-11 ENCOUNTER — Other Ambulatory Visit: Payer: Self-pay | Admitting: Nurse Practitioner

## 2021-07-11 ENCOUNTER — Telehealth: Payer: Self-pay | Admitting: Nurse Practitioner

## 2021-07-11 DIAGNOSIS — E1165 Type 2 diabetes mellitus with hyperglycemia: Secondary | ICD-10-CM

## 2021-07-11 NOTE — Telephone Encounter (Signed)
Accu check strips refill request  She is out of them right now and feels like her blood pressure is low

## 2021-07-12 NOTE — Telephone Encounter (Signed)
Rx refills were sent to the patients pharmacy on yesterday by Ms. Passmore.

## 2021-07-20 ENCOUNTER — Ambulatory Visit: Payer: 59 | Admitting: Nurse Practitioner

## 2021-07-22 ENCOUNTER — Ambulatory Visit
Admission: EM | Admit: 2021-07-22 | Discharge: 2021-07-22 | Disposition: A | Discharge status: Home / Self Care | Payer: 59 | Attending: Urgent Care | Admitting: Urgent Care

## 2021-07-22 ENCOUNTER — Ambulatory Visit (INDEPENDENT_AMBULATORY_CARE_PROVIDER_SITE_OTHER): Payer: 59

## 2021-07-22 DIAGNOSIS — M545 Low back pain, unspecified: Secondary | ICD-10-CM | POA: Diagnosis not present

## 2021-07-22 DIAGNOSIS — M25531 Pain in right wrist: Secondary | ICD-10-CM

## 2021-07-22 DIAGNOSIS — M25579 Pain in unspecified ankle and joints of unspecified foot: Secondary | ICD-10-CM

## 2021-07-22 DIAGNOSIS — W19XXXA Unspecified fall, initial encounter: Secondary | ICD-10-CM | POA: Diagnosis not present

## 2021-07-22 MED ORDER — TIZANIDINE HCL 4 MG PO TABS: 4.0000 mg | ORAL_TABLET | Freq: Four times a day (QID) | ORAL | 0 refills | Status: DC | PRN

## 2021-07-22 MED ORDER — MELOXICAM 7.5 MG PO TABS: 7.5000 mg | ORAL_TABLET | Freq: Every day | ORAL | 0 refills | Status: DC

## 2021-07-22 MED ORDER — KETOROLAC TROMETHAMINE 60 MG/2ML IM SOLN
60.0000 mg | Freq: Once | INTRAMUSCULAR | Status: AC
  Administered 2021-07-22: 60 mg via INTRAMUSCULAR

## 2021-07-22 NOTE — ED Triage Notes (Signed)
Patient presents to Urgent Care with complaints of bilateral ankle pain, r wrist, and back pain since 5 days ago when she fell attempting to catch her mother from falling. Patient reports pain 25/10.

## 2021-10-22 ENCOUNTER — Other Ambulatory Visit: Payer: Self-pay | Admitting: Nurse Practitioner

## 2021-11-02 ENCOUNTER — Ambulatory Visit (INDEPENDENT_AMBULATORY_CARE_PROVIDER_SITE_OTHER): Payer: 59

## 2021-11-02 ENCOUNTER — Ambulatory Visit
Admission: EM | Admit: 2021-11-02 | Discharge: 2021-11-02 | Disposition: A | Discharge status: Home / Self Care | Payer: 59 | Attending: Internal Medicine | Admitting: Internal Medicine

## 2021-11-02 DIAGNOSIS — M25572 Pain in left ankle and joints of left foot: Secondary | ICD-10-CM

## 2021-11-02 DIAGNOSIS — S99912A Unspecified injury of left ankle, initial encounter: Secondary | ICD-10-CM | POA: Diagnosis not present

## 2021-11-02 MED ORDER — PREDNISONE 20 MG PO TABS: 40.0000 mg | ORAL_TABLET | Freq: Every day | ORAL | 0 refills | Status: AC

## 2021-11-02 NOTE — ED Provider Notes (Signed)
[facility]    CSN: [id number] Arrival date & time: 11/02/21  1235      History   Chief Complaint Chief Complaint  Patient presents with   Ankle Injury    HPI [name] is a 41 y.o. female.   Patient here today for evaluation of pain to her left medial malleolus after accidentally missing steps and falling 2 days ago.  She reports that at that time she felt as if her legs gave out.  She attributes this to fatigue from taking care of her mother, 3 children and 2 dogs.  She notes that she has continued to have some pain with weightbearing and walking and movement of her ankle worsens pain.  She denies any numbness or tingling.  She has tried taking ibuprofen without significant relief.  The history is provided by the patient.  Ankle Injury Pertinent negatives include no abdominal pain.    Past Medical History:  Diagnosis Date   Anemia    Anxiety 03/2019   Diabetes mellitus without complication ([place])    Insomnia 04/07/2019   Kidney stone    Low iron stores 12/2019   Migraine    Vitamin D deficiency 03/2019    Patient Active Problem List   Diagnosis Date Noted   Uncontrolled type 2 diabetes mellitus with hyperglycemia ([place]) 05/26/2021   Nonintractable headache 04/10/2019   Anxiety 04/10/2019   Insomnia 04/10/2019    Past Surgical History:  Procedure Laterality Date   CESAREAN SECTION     TUBAL LIGATION Left June 2012   WISDOM TOOTH EXTRACTION      OB History     Gravida  6   Para  3   Term  3   Preterm      AB  3   Living  3      SAB  1   IAB  1   Ectopic  1   Multiple      Live Births               Home Medications    Prior to Admission medications   Medication Sig Start Date End Date Taking? Authorizing Provider  predniSONE (DELTASONE) 20 MG tablet Take 2 tablets (40 mg total) by mouth daily with breakfast for 5 days. 11/02/21 11/07/21 Yes Francene Finders, PA-C  ACCU-CHEK GUIDE test strip USE UP TO 4 TIMES  DAILY AS DIRECTED 07/11/21   Bo Merino I, NP  Accu-Chek Softclix Lancets lancets USE UP TO 4 TIMES DAILY AS DIRECTED 07/11/21   Bo Merino I, NP  blood glucose meter kit and supplies KIT Dispense based on patient and insurance preference. Use up to four times daily as directed. 05/25/21   Fenton Foy, NP  cetirizine (ZYRTEC) 10 MG tablet Take 1 tablet (10 mg total) by mouth daily. 05/25/21   Fenton Foy, NP  escitalopram (LEXAPRO) 10 MG tablet Take 1 tablet (10 mg total) by mouth daily. 05/25/21 06/24/21  Fenton Foy, NP  ferrous sulfate 325 (65 FE) MG EC tablet Take 1 tablet (325 mg total) by mouth daily. 08/16/20 08/11/21  Vevelyn Francois, NP  glipiZIDE (GLUCOTROL) 5 MG tablet TAKE 1 TABLET (5 MG TOTAL) BY MOUTH TWICE A DAY BEFORE MEALS 10/24/21   Fenton Foy, NP  meloxicam (MOBIC) 7.5 MG tablet Take 1 tablet (7.5 mg total) by mouth daily. 07/22/21   Crain, Whitney L, PA  metFORMIN (GLUCOPHAGE) 1000 MG tablet Take 1 tablet (1,000 mg total)  by mouth 2 (two) times daily with a meal. 05/25/21   Fenton Foy, NP  nystatin (MYCOSTATIN) 100000 UNIT/ML suspension Take 5 mLs (500,000 Units total) by mouth 4 (four) times daily. 10/14/19   Tasia Catchings, Amy V, PA-C  tiZANidine (ZANAFLEX) 4 MG tablet Take 1 tablet (4 mg total) by mouth every 6 (six) hours as needed for muscle spasms. 07/22/21   Crain, Whitney L, PA  traZODone (DESYREL) 100 MG tablet Take 1 tablet (100 mg total) by mouth at bedtime. 05/25/21   Fenton Foy, NP  Vitamin D, Ergocalciferol, (DRISDOL) 1.25 MG (50000 UNIT) CAPS capsule Take 1 capsule (50,000 Units total) by mouth every 7 (seven) days. 08/16/20   Vevelyn Francois, NP  topiramate (TOPAMAX) 25 MG tablet TAKE 1 TABLET BY MOUTH TWICE A DAY 07/01/19 09/24/19  Azzie Glatter, FNP    Family History Family History  Problem Relation Age of Onset   Diabetes Mother    Hypertension Mother    Healthy Father    Breast cancer Maternal Aunt    Breast cancer Maternal  Grandmother    Diabetes Maternal Grandmother    Hypertension Maternal Grandmother    Cancer Maternal Grandmother    Diabetes Maternal Grandfather    Hypertension Maternal Grandfather    Breast cancer Cousin     Social History Social History   Tobacco Use   Smoking status: Never   Smokeless tobacco: Never  Vaping Use   Vaping Use: Never used  Substance Use Topics   Alcohol use: No   Drug use: No     Allergies   Patient has no known allergies.   Review of Systems Review of Systems  Constitutional:  Negative for chills and fever.  Eyes:  Negative for discharge and redness.  Gastrointestinal:  Negative for abdominal pain, nausea and vomiting.  Musculoskeletal:  Positive for arthralgias and joint swelling.  Neurological:  Negative for numbness.     Physical Exam Triage Vital Signs ED Triage Vitals [11/02/21 1411]  Enc Vitals Group     BP 125/85     Pulse Rate 80     Resp 16     Temp 98 F (36.7 C)     Temp Source Oral     SpO2 97 %     Weight      Height      Head Circumference      Peak Flow      Pain Score 10     Pain Loc      Pain Edu?      Excl. in [place]?    No data found.  Updated Vital Signs BP 125/85 (BP Location: Left Arm)   Pulse 80   Temp 98 F (36.7 C) (Oral)   Resp 16   SpO2 97%      Physical Exam Vitals and nursing note reviewed.  Constitutional:      General: She is not in acute distress.    Appearance: Normal appearance. She is not ill-appearing.  HENT:     Head: Normocephalic and atraumatic.  Eyes:     Conjunctiva/sclera: Conjunctivae normal.  Cardiovascular:     Rate and Rhythm: Normal rate.  Pulmonary:     Effort: Pulmonary effort is normal. No respiratory distress.  Musculoskeletal:     Comments: Mild swelling appreciated to medial left malleolus.  Decreased range of motion of left ankle due to pain.  Patient is able to bear weight on left foot/ ankle.   Neurological:  Mental Status: She is alert.     Comments: Gross  sensation intact to left ankle  Psychiatric:        Mood and Affect: Mood normal.        Behavior: Behavior normal.        Thought Content: Thought content normal.      UC Treatments / Results  Labs (all labs ordered are listed, but only abnormal results are displayed) Labs Reviewed - No data to display  EKG   Radiology DG Ankle Complete Left  Result Date: 11/02/2021 CLINICAL DATA:  Trauma, pain EXAM: LEFT ANKLE COMPLETE - 3+ VIEW COMPARISON:  12/29/2020 FINDINGS: No recent fracture or dislocation is seen. 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 There is soft tissue swelling around the ankle. IMPRESSION: No recent fracture or dislocation is seen in left ankle. Electronically Signed   By: Elmer Picker M.D.   On: 11/02/2021 14:21    Procedures Procedures (including critical care time)  Medications Ordered in UC Medications - No data to display  Initial Impression / Assessment and Plan / UC Course  I have reviewed the triage vital signs and the nursing notes.  Pertinent labs & imaging results that were available during my care of the patient were reviewed by me and considered in my medical decision making (see chart for details).   No fracture noted on x-ray.  Will treat to cover sprain with steroids and recommended follow-up with Ortho if no gradual improvement over the next week.   Final Clinical Impressions(s) / UC Diagnoses   Final diagnoses:  Injury of left ankle, initial encounter   Discharge Instructions   None    ED Prescriptions     Medication Sig Dispense Auth. Provider   predniSONE (DELTASONE) 20 MG tablet Take 2 tablets (40 mg total) by mouth daily with breakfast for 5 days. 10 tablet Francene Finders, PA-C      PDMP not reviewed this encounter.   Francene Finders, PA-C 11/02/21 [phone]

## 2021-11-02 NOTE — ED Triage Notes (Signed)
Pt c/o falling down steps at home and twisting left ankle.   Onset ~ Monday

## 2021-11-16 ENCOUNTER — Ambulatory Visit: Payer: Self-pay | Admitting: Nurse Practitioner

## 2021-11-23 ENCOUNTER — Ambulatory Visit: Payer: Self-pay | Admitting: Nurse Practitioner

## 2021-12-20 ENCOUNTER — Ambulatory Visit: Payer: Self-pay | Admitting: Nurse Practitioner

## 2021-12-30 ENCOUNTER — Encounter: Payer: Self-pay | Admitting: Nurse Practitioner

## 2021-12-30 ENCOUNTER — Other Ambulatory Visit: Payer: Self-pay

## 2021-12-30 ENCOUNTER — Ambulatory Visit (INDEPENDENT_AMBULATORY_CARE_PROVIDER_SITE_OTHER): Payer: 59 | Admitting: Nurse Practitioner

## 2021-12-30 VITALS — BP 121/83 | HR 81 | Ht 67.0 in | Wt 229.4 lb

## 2021-12-30 DIAGNOSIS — E559 Vitamin D deficiency, unspecified: Secondary | ICD-10-CM

## 2021-12-30 DIAGNOSIS — G4709 Other insomnia: Secondary | ICD-10-CM

## 2021-12-30 DIAGNOSIS — E1165 Type 2 diabetes mellitus with hyperglycemia: Secondary | ICD-10-CM

## 2021-12-30 DIAGNOSIS — Z23 Encounter for immunization: Secondary | ICD-10-CM

## 2021-12-30 LAB — POCT GLYCOSYLATED HEMOGLOBIN (HGB A1C): Hemoglobin A1C: 10.6 % — AB (ref 4.0–5.6)

## 2021-12-30 MED ORDER — METFORMIN HCL ER 500 MG PO TB24: 500.0000 mg | ORAL_TABLET | Freq: Every day | ORAL | 3 refills | Status: DC

## 2021-12-30 MED ORDER — GLIPIZIDE 10 MG PO TABS: 10.0000 mg | ORAL_TABLET | Freq: Two times a day (BID) | ORAL | 3 refills | Status: DC

## 2021-12-30 MED ORDER — BLOOD GLUCOSE MONITOR KIT: PACK | 0 refills | Status: DC

## 2021-12-30 NOTE — Assessment & Plan Note (Signed)
Continue trazodone 100 mg daily at bedtime

## 2021-12-30 NOTE — Patient Instructions (Addendum)
Goal for fasting blood sugar ranges from 80 to 120 and 2 hours after any meal or at bedtime should be between 130 to 170.  1. Uncontrolled type 2 diabetes mellitus with hyperglycemia (HCC)  - Ambulatory referral to Ophthalmology - Basic Metabolic Panel - Lipid Panel - Urine microalbumin-creatinine with uACR - glipiZIDE (GLUCOTROL) 10 MG tablet; Take 1 tablet (10 mg total) by mouth 2 (two) times daily before a meal.  Dispense: 60 tablet; Refill: 3     It is important that you exercise regularly at least 30 minutes 5 times a week as tolerated  Think about what you will eat, plan ahead. Choose " clean, green, fresh or frozen" over canned, processed or packaged foods which are more sugary, salty and fatty. 70 to 75% of food eaten should be vegetables and fruit. Three meals at set times with snacks allowed between meals, but they must be fruit or vegetables. Aim to eat over a 12 hour period , example 7 am to 7 pm, and STOP after  your last meal of the day. Drink water,generally about 64 ounces per day, no other drink is as healthy. Fruit juice is best enjoyed in a healthy way, by EATING the fruit.  Thanks for choosing [facility] we consider it a privelige to serve you.

## 2021-12-30 NOTE — Assessment & Plan Note (Signed)
Patient educated on CDC recommendation for the TDAP vaccine. Verbal consent was obtained from the patient, vaccine administered by nurse, no sign of adverse reactions noted at this time. Patient education on arm soreness and use of tylenol or ibuprofen (if safe) for this patient  was discussed. Patient educated on the signs and symptoms of adverse effect and advise to contact the office if they occur.  

## 2021-12-30 NOTE — Progress Notes (Signed)
New Patient Office Visit  Subjective:  Patient ID: [name], female    DOB: July 13, 1980  Age: 41 y.o. MRN: [id number]  CC:  Chief Complaint  Patient presents with   Diabetes    Follow up    HPI [name] is a 41 y.o. female with past medical history of uncontrolled type 2 diabetes, obesity, anxiety, insomnia presents for follow-up chronic medical conditions.   Type 2 diabetes.  Currently on glipizide 5 mg twice daily, stated that she stopped taking metformin about 3 weeks ago due to GI upset.  Patient states that she can do better with her diet she has also not been taking her medications consistently I. She denies hypoglycemia states that her blood sugar readings as been between 10 2-1 78.   Due for Tdap vaccine need for Tdap vaccine discussed Tdap vaccine administered in the office today.  She declined flu vaccine.   Past Medical History:  Diagnosis Date   Anemia    Anxiety 03/2019   Diabetes mellitus without complication (HCC)    Insomnia 04/07/2019   Kidney stone    Low iron stores 12/2019   Migraine    Vitamin D deficiency 03/2019    Past Surgical History:  Procedure Laterality Date   CESAREAN SECTION     TUBAL LIGATION Left June 2012   WISDOM TOOTH EXTRACTION      Family History  Problem Relation Age of Onset   Diabetes Mother    Hypertension Mother    Healthy Father    Breast cancer Maternal Aunt    Breast cancer Maternal Grandmother    Diabetes Maternal Grandmother    Hypertension Maternal Grandmother    Cancer Maternal Grandmother    Diabetes Maternal Grandfather    Hypertension Maternal Grandfather    Breast cancer Cousin     Social History   Socioeconomic History   Marital status: Single    Spouse name: Not on file   Number of children: Not on file   Years of education: Not on file   Highest education level: Not on file  Occupational History   Not on file  Tobacco Use   Smoking status: Never   Smokeless tobacco: Never   Vaping Use   Vaping Use: Never used  Substance and Sexual Activity   Alcohol use: No   Drug use: No   Sexual activity: Yes    Birth control/protection: Condom  Other Topics Concern   Not on file  Social History Narrative   Not on file   Social Determinants of Health   Financial Resource Strain: Not on file  Food Insecurity: Not on file  Transportation Needs: Not on file  Physical Activity: Not on file  Stress: Not on file  Social Connections: Not on file  Intimate Partner Violence: Not on file    ROS Review of Systems  Constitutional:  Negative for activity change, appetite change, chills, diaphoresis and fatigue.  Cardiovascular: Negative.  Negative for chest pain, palpitations and leg swelling.  Gastrointestinal: Negative.  Negative for abdominal distention, abdominal pain and anal bleeding.  Endocrine: Negative for polydipsia, polyphagia and polyuria.  Genitourinary: Negative.   Neurological:  Negative for dizziness, facial asymmetry, light-headedness and headaches.  Psychiatric/Behavioral:  Positive for sleep disturbance. Negative for agitation, behavioral problems, confusion, decreased concentration and dysphoric mood. The patient is not nervous/anxious.     Objective:   Today's Vitals: BP 121/83   Pulse 81   Ht _0  (1.702 m)   Wt  229 lb 6.4 oz (104.1 kg)   SpO2 100%   BMI 35.93 kg/m   Physical Exam Constitutional:      General: She is not in acute distress.    Appearance: She is obese. She is not ill-appearing, toxic-appearing or diaphoretic.  Eyes:     General: No scleral icterus.       Right eye: No discharge.        Left eye: No discharge.     Extraocular Movements: Extraocular movements intact.     Conjunctiva/sclera: Conjunctivae normal.  Cardiovascular:     Rate and Rhythm: Normal rate and regular rhythm.     Pulses: Normal pulses.     Heart sounds: No murmur heard.    No friction rub. No gallop.  Pulmonary:     Effort: Pulmonary effort is  normal. No respiratory distress.     Breath sounds: Normal breath sounds. No stridor. No wheezing, rhonchi or rales.  Chest:     Chest wall: No tenderness.  Abdominal:     General: There is no distension.     Palpations: Abdomen is soft.     Tenderness: There is no abdominal tenderness.  Musculoskeletal:        General: No swelling, tenderness, deformity or signs of injury.     Right lower leg: No edema.     Left lower leg: No edema.  Skin:    General: Skin is warm and dry.     Capillary Refill: Capillary refill takes less than 2 seconds.     Coloration: Skin is not jaundiced or pale.  Neurological:     Mental Status: She is alert and oriented to person, place, and time.     Cranial Nerves: No cranial nerve deficit.     Motor: No weakness.     Gait: Gait normal.  Psychiatric:        Mood and Affect: Mood normal.        Behavior: Behavior normal.        Thought Content: Thought content normal.        Judgment: Judgment normal.     Assessment & Plan:   Problem List Items Addressed This Visit       Endocrine   Uncontrolled type 2 diabetes mellitus with hyperglycemia ([place]) - Primary    Lab Results  Component Value Date   HGBA1C 9.1 (A) 05/25/2021   HGBA1C 9.1 05/25/2021   HGBA1C 9.1 (A) 05/25/2021   HGBA1C 9.1 (A) 05/25/2021    Lab Results  Component Value Date   HGBA1C 10.6 (A) 12/30/2021  Uncontrolled condition Changed to metformin XR 1000 mg twice daily, start glipizide 10 mg twice daily. CBG goals discussed with patient Patient counseled on low-carb modified diet She was encouraged to engage in regular moderate exercises at least 150 minutes weekly Check fasting lipid panel, urine creatinine albumin ratio Referral sent for diabetic eye exam, diabetic foot exam completed in the office today Need to get diabetes under control to reduce risk of CVA, heart disease, kidney damage discussed with patient she verbalized understanding. Not on ACE or ARB but her blood  pressure is well controlled. Glucose monitoring device and supply ordered.      Relevant Medications   metFORMIN (GLUCOPHAGE-XR) 500 MG 24 hr tablet   glipiZIDE (GLUCOTROL) 10 MG tablet   Other Relevant Orders   Ambulatory referral to Ophthalmology   Lipid Panel   Urine microalbumin-creatinine with uACR   Basic Metabolic Panel   POCT glycosylated hemoglobin (Hb  A1C) (Completed)     Other   Insomnia    Continue trazodone 100 mg daily at bedtime      Need for Tdap vaccination    Patient educated on CDC recommendation for the TDAP vaccine. Verbal consent was obtained from the patient, vaccine administered by nurse, no sign of adverse reactions noted at this time. Patient education on arm soreness and use of tylenol or ibuprofen (if safe) for this patient  was discussed. Patient educated on the signs and symptoms of adverse effect and advise to contact the office if they occur.       Relevant Orders   Tdap vaccine greater than or equal to 7yo IM (Completed)   Vitamin D deficiency    Last vitamin D Lab Results  Component Value Date   VD25OH 6.2 (L) 12/09/2019  Check vitamin D levels       Outpatient Encounter Medications as of 12/30/2021  Medication Sig   ACCU-CHEK GUIDE test strip USE UP TO 4 TIMES DAILY AS DIRECTED   Accu-Chek Softclix Lancets lancets USE UP TO 4 TIMES DAILY AS DIRECTED   cetirizine (ZYRTEC) 10 MG tablet Take 1 tablet (10 mg total) by mouth daily.   ferrous sulfate 325 (65 FE) MG EC tablet Take 1 tablet (325 mg total) by mouth daily.   glipiZIDE (GLUCOTROL) 10 MG tablet Take 1 tablet (10 mg total) by mouth 2 (two) times daily before a meal.   metFORMIN (GLUCOPHAGE-XR) 500 MG 24 hr tablet Take 1 tablet (500 mg total) by mouth daily with breakfast.   traZODone (DESYREL) 100 MG tablet Take 1 tablet (100 mg total) by mouth at bedtime.   [DISCONTINUED] blood glucose meter kit and supplies KIT Dispense based on patient and insurance preference. Use up to four times  daily as directed.   [DISCONTINUED] glipiZIDE (GLUCOTROL) 5 MG tablet TAKE 1 TABLET (5 MG TOTAL) BY MOUTH TWICE A DAY BEFORE MEALS   [DISCONTINUED] metFORMIN (GLUCOPHAGE) 1000 MG tablet Take 1 tablet (1,000 mg total) by mouth 2 (two) times daily with a meal.   escitalopram (LEXAPRO) 10 MG tablet Take 1 tablet (10 mg total) by mouth daily. (Patient not taking: Reported on 12/30/2021)   meloxicam (MOBIC) 7.5 MG tablet Take 1 tablet (7.5 mg total) by mouth daily. (Patient not taking: Reported on 12/30/2021)   tiZANidine (ZANAFLEX) 4 MG tablet Take 1 tablet (4 mg total) by mouth every 6 (six) hours as needed for muscle spasms. (Patient not taking: Reported on 12/30/2021)   Vitamin D, Ergocalciferol, (DRISDOL) 1.25 MG (50000 UNIT) CAPS capsule Take 1 capsule (50,000 Units total) by mouth every 7 (seven) days. (Patient not taking: Reported on 12/30/2021)   [DISCONTINUED] nystatin (MYCOSTATIN) 100000 UNIT/ML suspension Take 5 mLs (500,000 Units total) by mouth 4 (four) times daily. (Patient not taking: Reported on 12/30/2021)   [DISCONTINUED] topiramate (TOPAMAX) 25 MG tablet TAKE 1 TABLET BY MOUTH TWICE A DAY   No facility-administered encounter medications on file as of 12/30/2021.    Follow-up: Return in about 3 months (around 03/31/2022) for f/u for DM.   Renee Rival, FNP

## 2021-12-30 NOTE — Assessment & Plan Note (Addendum)
Lab Results  Component Value Date   HGBA1C 9.1 (A) 05/25/2021   HGBA1C 9.1 05/25/2021   HGBA1C 9.1 (A) 05/25/2021   HGBA1C 9.1 (A) 05/25/2021    Lab Results  Component Value Date   HGBA1C 10.6 (A) 12/30/2021  Uncontrolled condition Changed to metformin XR 1000 mg twice daily, start glipizide 10 mg twice daily. CBG goals discussed with patient Patient counseled on low-carb modified diet She was encouraged to engage in regular moderate exercises at least 150 minutes weekly Check fasting lipid panel, urine creatinine albumin ratio Referral sent for diabetic eye exam, diabetic foot exam completed in the office today Need to get diabetes under control to reduce risk of CVA, heart disease, kidney damage discussed with patient she verbalized understanding. Not on ACE or ARB but her blood pressure is well controlled. Glucose monitoring device and supply ordered.

## 2021-12-30 NOTE — Assessment & Plan Note (Signed)
Last vitamin D Lab Results  Component Value Date   VD25OH 6.2 (L) 12/09/2019  Check vitamin D levels

## 2022-01-11 ENCOUNTER — Ambulatory Visit: Payer: Self-pay | Admitting: Nurse Practitioner

## 2022-02-09 ENCOUNTER — Telehealth: Payer: Self-pay | Admitting: Nurse Practitioner

## 2022-02-09 ENCOUNTER — Other Ambulatory Visit: Payer: Self-pay

## 2022-02-09 DIAGNOSIS — E1165 Type 2 diabetes mellitus with hyperglycemia: Secondary | ICD-10-CM

## 2022-02-09 MED ORDER — ACCU-CHEK SOFTCLIX LANCETS MISC: 1 refills | Status: DC

## 2022-02-09 MED ORDER — ACCU-CHEK GUIDE VI STRP: ORAL_STRIP | 1 refills | Status: DC

## 2022-02-09 NOTE — Telephone Encounter (Signed)
Caller & Relationship to patient:  MRN #  [id number]   Call Back Number:   Date of Last Office Visit: 12/30/2021     Date of Next Office Visit: 03/31/2022    Medication(s) to be Refilled: Lancet and stripes .She also requested for a large quantity since she uses it 3 times a day   Preferred Pharmacy:   ** Please notify patient to allow 48-72 hours to process** **Let patient know to contact pharmacy at the end of the day to make sure medication is ready. ** **If patient has not been seen in a year or longer, book an appointment **Advise to use MyChart for refill requests OR to contact their pharmacy

## 2022-02-09 NOTE — Telephone Encounter (Signed)
Done KH 

## 2022-02-10 ENCOUNTER — Other Ambulatory Visit: Payer: Self-pay | Admitting: Nurse Practitioner

## 2022-02-10 DIAGNOSIS — E1165 Type 2 diabetes mellitus with hyperglycemia: Secondary | ICD-10-CM

## 2022-02-10 MED ORDER — ACCU-CHEK GUIDE VI STRP: ORAL_STRIP | 1 refills | Status: DC

## 2022-02-10 MED ORDER — ACCU-CHEK SOFTCLIX LANCETS MISC: 1 refills | Status: DC

## 2022-03-31 ENCOUNTER — Ambulatory Visit: Payer: Self-pay | Admitting: Nurse Practitioner

## 2022-05-05 ENCOUNTER — Other Ambulatory Visit: Payer: Self-pay | Admitting: Nurse Practitioner

## 2022-05-12 ENCOUNTER — Ambulatory Visit: Payer: 59 | Admitting: Nurse Practitioner

## 2022-05-29 ENCOUNTER — Ambulatory Visit: Payer: 59 | Admitting: Nurse Practitioner

## 2022-06-03 ENCOUNTER — Ambulatory Visit (INDEPENDENT_AMBULATORY_CARE_PROVIDER_SITE_OTHER): Payer: 59

## 2022-06-03 ENCOUNTER — Ambulatory Visit
Admission: EM | Admit: 2022-06-03 | Discharge: 2022-06-03 | Disposition: A | Discharge status: Home / Self Care | Payer: 59

## 2022-06-03 DIAGNOSIS — M25572 Pain in left ankle and joints of left foot: Secondary | ICD-10-CM | POA: Diagnosis not present

## 2022-06-03 NOTE — ED Provider Notes (Signed)
[facility]    CSN: [id number] Arrival date & time: 06/03/22  1054      History   Chief Complaint Chief Complaint  Patient presents with   Ankle Pain    Left    HPI [name] is a 42 y.o. female.   Patient presents with left ankle pain that started about 2 days ago after an injury.  Patient reports that she was helping her mother take a shower when she stepped backwards wrong and twisted her ankle over.  She also has had previous injury to the ankle in october with negative x-rays per patient reprt.  Reports that her ankle always stays swollen ever since injury.  Has taken ibuprofen for pain.  Denies numbness or tingling.  Patient is able to bear weight.   Ankle Pain   Past Medical History:  Diagnosis Date   Anemia    Anxiety 03/2019   Diabetes mellitus without complication (HCC)    Insomnia 04/07/2019   Kidney stone    Low iron stores 12/2019   Migraine    Vitamin D deficiency 03/2019    Patient Active Problem List   Diagnosis Date Noted   Need for Tdap vaccination 12/30/2021   Vitamin D deficiency 12/30/2021   Uncontrolled type 2 diabetes mellitus with hyperglycemia (HCC) 05/26/2021   Nonintractable headache 04/10/2019   Anxiety 04/10/2019   Insomnia 04/10/2019    Past Surgical History:  Procedure Laterality Date   CESAREAN SECTION     TUBAL LIGATION Left June 2012   WISDOM TOOTH EXTRACTION      OB History     Gravida  6   Para  3   Term  3   Preterm      AB  3   Living  3      SAB  1   IAB  1   Ectopic  1   Multiple      Live Births               Home Medications    Prior to Admission medications   Medication Sig Start Date End Date Taking? Authorizing Provider  Accu-Chek Softclix Lancets lancets Use as instructed to check blood sugar BID 02/10/22  Yes Ivonne Andrew, NP  amoxicillin (AMOXIL) 500 MG capsule Take 500 mg by mouth 3 (three) times daily. 06/02/22  Yes [provider]  blood glucose  meter kit and supplies KIT Dispense based on patient and insurance preference. Use up to four times daily as directed. 12/30/21  Yes Paseda, Phillips Grout R, FNP  glucose blood (ACCU-CHEK GUIDE) test strip Use as instructed to check blood sugars BID 02/10/22  Yes Ivonne Andrew, NP  ibuprofen (ADVIL) 800 MG tablet Take by mouth. 06/02/22  Yes [provider]  metFORMIN (GLUCOPHAGE-XR) 500 MG 24 hr tablet Take 1 tablet (500 mg total) by mouth daily with breakfast. 12/30/21  Yes Paseda, Baird Kay, FNP  cetirizine (ZYRTEC) 10 MG tablet Take 1 tablet (10 mg total) by mouth daily. 05/25/21   Ivonne Andrew, NP  escitalopram (LEXAPRO) 10 MG tablet Take 1 tablet (10 mg total) by mouth daily. Patient not taking: Reported on 12/30/2021 05/25/21 06/24/21  Ivonne Andrew, NP  ferrous sulfate 325 (65 FE) MG EC tablet Take 1 tablet (325 mg total) by mouth daily. 08/16/20 12/30/21  Barbette Merino, NP  glipiZIDE (GLUCOTROL) 10 MG tablet Take 1 tablet (10 mg total) by mouth 2 (two) times daily before a meal.  12/30/21   Paseda, Baird Kay, FNP  glipiZIDE (GLUCOTROL) 5 MG tablet Take 5 mg by mouth 2 (two) times daily. 01/29/22   [provider]  meloxicam (MOBIC) 15 MG tablet Take 15 mg by mouth daily. 01/02/22   [provider]  meloxicam (MOBIC) 7.5 MG tablet Take 1 tablet (7.5 mg total) by mouth daily. 07/22/21   Crain, Whitney L, PA  tiZANidine (ZANAFLEX) 4 MG tablet Take 1 tablet (4 mg total) by mouth every 6 (six) hours as needed for muscle spasms. 07/22/21   Crain, Whitney L, PA  traZODone (DESYREL) 100 MG tablet Take 1 tablet (100 mg total) by mouth at bedtime. 05/25/21   Ivonne Andrew, NP  Vitamin D, Ergocalciferol, (DRISDOL) 1.25 MG (50000 UNIT) CAPS capsule Take 1 capsule (50,000 Units total) by mouth every 7 (seven) days. 08/16/20   Barbette Merino, NP  topiramate (TOPAMAX) 25 MG tablet TAKE 1 TABLET BY MOUTH TWICE A DAY 07/01/19 09/24/19  Kallie Locks, FNP    Family History Family  History  Problem Relation Age of Onset   Diabetes Mother    Hypertension Mother    Healthy Father    Breast cancer Maternal Aunt    Breast cancer Maternal Grandmother    Diabetes Maternal Grandmother    Hypertension Maternal Grandmother    Cancer Maternal Grandmother    Diabetes Maternal Grandfather    Hypertension Maternal Grandfather    Breast cancer Cousin     Social History Social History   Tobacco Use   Smoking status: Never   Smokeless tobacco: Never  Vaping Use   Vaping Use: Never used  Substance Use Topics   Alcohol use: No   Drug use: No     Allergies   Patient has no known allergies.   Review of Systems Review of Systems Per HPI  Physical Exam Triage Vital Signs ED Triage Vitals  Enc Vitals Group     BP 06/03/22 1110 115/79     Pulse Rate 06/03/22 1110 91     Resp 06/03/22 1110 18     Temp 06/03/22 1110 97.9 F (36.6 C)     Temp Source 06/03/22 1110 Oral     SpO2 06/03/22 1110 96 %     Weight 06/03/22 1105 219 lb (99.3 kg)     Height 06/03/22 1105 5\' 7"  (1.702 m)     Head Circumference --      Peak Flow --      Pain Score 06/03/22 1105 10     Pain Loc --      Pain Edu? --      Excl. in GC? --    No data found.  Updated Vital Signs BP 115/79 (BP Location: Left Arm)   Pulse 91   Temp 97.9 F (36.6 C) (Oral)   Resp 18   Ht 5\' 7"  (1.702 m)   Wt 219 lb (99.3 kg)   LMP 05/22/2022 (Exact Date)   SpO2 96%   BMI 34.30 kg/m   Visual Acuity Right Eye Distance:   Left Eye Distance:   Bilateral Distance:    Right Eye Near:   Left Eye Near:    Bilateral Near:     Physical Exam Constitutional:      General: She is not in acute distress.    Appearance: Normal appearance. She is not toxic-appearing or diaphoretic.  HENT:     Head: Normocephalic and atraumatic.  Eyes:     Extraocular Movements: Extraocular movements intact.  Conjunctiva/sclera: Conjunctivae normal.  Pulmonary:     Effort: Pulmonary effort is normal.   Musculoskeletal:     Comments: Patient has circumferential swelling to the left ankle.  Tenderness to palpation throughout anterior, lateral, medial ankle.  No tenderness to foot.  Patient can wiggle toes.  Neurovascular intact.  No abrasions or lacerations noted.  No discoloration noted.  Neurological:     General: No focal deficit present.     Mental Status: She is alert and oriented to person, place, and time. Mental status is at baseline.  Psychiatric:        Mood and Affect: Mood normal.        Behavior: Behavior normal.        Thought Content: Thought content normal.        Judgment: Judgment normal.      UC Treatments / Results  Labs (all labs ordered are listed, but only abnormal results are displayed) Labs Reviewed - No data to display  EKG   Radiology DG Ankle Complete Left  Result Date: 06/03/2022 CLINICAL DATA:  Pain and injury. EXAM: LEFT ANKLE COMPLETE - 3+ VIEW COMPARISON:  01/02/2022 and 11/02/2021 FINDINGS: Chronic ossification or loose body just distal to the medial malleolus. Negative for an acute fracture or dislocation. Enthesopathic changes at the Achilles tendon insertion site on the calcaneus. Probable mild soft tissue swelling. IMPRESSION: 1. No acute bone abnormality to the left ankle. Electronically Signed   By: Richarda Overlie M.D.   On: 06/03/2022 11:34    Procedures Procedures (including critical care time)  Medications Ordered in UC Medications - No data to display  Initial Impression / Assessment and Plan / UC Course  I have reviewed the triage vital signs and the nursing notes.  Pertinent labs & imaging results that were available during my care of the patient were reviewed by me and considered in my medical decision making (see chart for details).     X-ray was negative for any acute bony abnormality.  Patient does have a small loose body which was noted on previous x-ray.  Most likely due to prior injury.  Discussed these findings with patient.   Advised follow-up with orthopedist given patient's pain is more persistent since injury.  Provided patient with contact information for orthopedist.  Lace up ankle brace applied by clinical staff prior to discharge.  Advised patient not to sleep in this.  Advised RICE.  Advised strict return precautions.  Patient verbalized understanding and was agreeable with plan. Final Clinical Impressions(s) / UC Diagnoses   Final diagnoses:  Acute left ankle pain     Discharge Instructions      Brace has been applied.  Nothing is currently broken but suspect ankle sprain and an old injury that is causing your discomfort.  Do not sleep in ankle brace.  Elevate extremity and apply ice.  Follow-up with orthopedist for further evaluation and management.     ED Prescriptions   None    PDMP not reviewed this encounter.   Gustavus Bryant, [place] 06/03/22 1150

## 2022-06-03 NOTE — ED Triage Notes (Signed)
Recurrent Left ankle pain. Pain is continual (previous xray's done here). Recently in putting mother in bath tub and stepping back landed wrong on left ankle twisting it "possible again". "I feel something is wrong".

## 2022-06-03 NOTE — Discharge Instructions (Signed)
Brace has been applied.  Nothing is currently broken but suspect ankle sprain and an old injury that is causing your discomfort.  Do not sleep in ankle brace.  Elevate extremity and apply ice.  Follow-up with orthopedist for further evaluation and management.

## 2022-08-25 ENCOUNTER — Ambulatory Visit: Payer: Self-pay | Admitting: Nurse Practitioner

## 2022-09-20 ENCOUNTER — Ambulatory Visit: Payer: Self-pay | Admitting: Nurse Practitioner

## 2022-09-28 ENCOUNTER — Ambulatory Visit (INDEPENDENT_AMBULATORY_CARE_PROVIDER_SITE_OTHER): Payer: 59 | Admitting: Nurse Practitioner

## 2022-09-28 ENCOUNTER — Encounter: Payer: Self-pay | Admitting: Nurse Practitioner

## 2022-09-28 VITALS — BP 114/73 | HR 86 | Resp 16 | Wt 222.0 lb

## 2022-09-28 DIAGNOSIS — Z13220 Encounter for screening for lipoid disorders: Secondary | ICD-10-CM | POA: Diagnosis not present

## 2022-09-28 DIAGNOSIS — E1165 Type 2 diabetes mellitus with hyperglycemia: Secondary | ICD-10-CM | POA: Diagnosis not present

## 2022-09-28 DIAGNOSIS — G4700 Insomnia, unspecified: Secondary | ICD-10-CM

## 2022-09-28 LAB — POCT GLYCOSYLATED HEMOGLOBIN (HGB A1C): Hemoglobin A1C: 10.7 % — AB (ref 4.0–5.6)

## 2022-09-28 MED ORDER — SEMAGLUTIDE(0.25 OR 0.5MG/DOS) 2 MG/3ML ~~LOC~~ SOPN
0.2500 mg | PEN_INJECTOR | SUBCUTANEOUS | 0 refills | Status: DC
Start: 2022-09-28 — End: 2022-11-08

## 2022-09-28 MED ORDER — TIZANIDINE HCL 4 MG PO TABS: 4.0000 mg | ORAL_TABLET | Freq: Four times a day (QID) | ORAL | 0 refills | Status: DC | PRN

## 2022-09-28 MED ORDER — TRAZODONE HCL 100 MG PO TABS: 100.0000 mg | ORAL_TABLET | Freq: Every day | ORAL | 0 refills | Status: DC

## 2022-09-28 NOTE — Progress Notes (Signed)
Pt presents for diabetes f/u -pt request to start Ozempic for diabetes management  -is not taking anything for diabetes

## 2022-09-28 NOTE — Progress Notes (Signed)
@Patient  ID: [name], female    DOB: Sep 04, 1980, 42 y.o.   MRN: [id number]  Chief Complaint  Patient presents with   Diabetes    Referring provider: Ivonne Andrew, NP   HPI  [name] is a 42 y.o. female with past medical history of uncontrolled type 2 diabetes, obesity, anxiety, insomnia presents for follow-up chronic medical conditions.    Type 2 diabetes.  Currently not taking diabetic medications.  Patient is requesting ozempic. States that she could not tolerate metformin due to GI upset.  A1C is 10.7.  Does not follow diabetic diet and does not exercise.  She is interested in losing weight.  We did have samples of Ozempic in the office today and gave her sample to take home.  We will start her out at 0.25 mg weekly.  Denies f/c/s, n/v/d, hemoptysis, PND, leg swelling. Denies chest pain or edema.   No Known Allergies  Immunization History  Administered Date(s) Administered   PFIZER(Purple Top)SARS-COV-2 Vaccination 11/26/2019   Tdap 12/30/2021    Past Medical History:  Diagnosis Date   Anemia    Anxiety 03/2019   Diabetes mellitus without complication (HCC)    Insomnia 04/07/2019   Kidney stone    Low iron stores 12/2019   Migraine    Vitamin D deficiency 03/2019    Tobacco History: Social History   Tobacco Use  Smoking Status Never  Smokeless Tobacco Never   Counseling given: Not Answered   Outpatient Encounter Medications as of 09/28/2022  Medication Sig   Semaglutide,0.25 or 0.5MG /DOS, 2 MG/3ML SOPN Inject 0.25 mg into the skin once a week.   Accu-Chek Softclix Lancets lancets Use as instructed to check blood sugar BID   blood glucose meter kit and supplies KIT Dispense based on patient and insurance preference. Use up to four times daily as directed.   cetirizine (ZYRTEC) 10 MG tablet Take 1 tablet (10 mg total) by mouth daily.   escitalopram (LEXAPRO) 10 MG tablet Take 1 tablet (10 mg total) by mouth daily. (Patient not taking:  Reported on 12/30/2021)   ferrous sulfate 325 (65 FE) MG EC tablet Take 1 tablet (325 mg total) by mouth daily.   glucose blood (ACCU-CHEK GUIDE) test strip Use as instructed to check blood sugars BID   ibuprofen (ADVIL) 800 MG tablet Take by mouth.   meloxicam (MOBIC) 7.5 MG tablet Take 1 tablet (7.5 mg total) by mouth daily.   tiZANidine (ZANAFLEX) 4 MG tablet Take 1 tablet (4 mg total) by mouth every 6 (six) hours as needed for muscle spasms.   traZODone (DESYREL) 100 MG tablet Take 1 tablet (100 mg total) by mouth at bedtime.   Vitamin D, Ergocalciferol, (DRISDOL) 1.25 MG (50000 UNIT) CAPS capsule Take 1 capsule (50,000 Units total) by mouth every 7 (seven) days.   [DISCONTINUED] amoxicillin (AMOXIL) 500 MG capsule Take 500 mg by mouth 3 (three) times daily.   [DISCONTINUED] glipiZIDE (GLUCOTROL) 10 MG tablet Take 1 tablet (10 mg total) by mouth 2 (two) times daily before a meal.   [DISCONTINUED] glipiZIDE (GLUCOTROL) 5 MG tablet Take 5 mg by mouth 2 (two) times daily.   [DISCONTINUED] meloxicam (MOBIC) 15 MG tablet Take 15 mg by mouth daily.   [DISCONTINUED] metFORMIN (GLUCOPHAGE-XR) 500 MG 24 hr tablet Take 1 tablet (500 mg total) by mouth daily with breakfast.   [DISCONTINUED] tiZANidine (ZANAFLEX) 4 MG tablet Take 1 tablet (4 mg total) by mouth every 6 (six) hours as needed for  muscle spasms.   [DISCONTINUED] topiramate (TOPAMAX) 25 MG tablet TAKE 1 TABLET BY MOUTH TWICE A DAY   [DISCONTINUED] traZODone (DESYREL) 100 MG tablet Take 1 tablet (100 mg total) by mouth at bedtime.   No facility-administered encounter medications on file as of 09/28/2022.     Review of Systems  Review of Systems  Constitutional: Negative.   HENT: Negative.    Cardiovascular: Negative.   Gastrointestinal: Negative.   Allergic/Immunologic: Negative.   Neurological: Negative.   Psychiatric/Behavioral: Negative.         Physical Exam  BP 114/73 (BP Location: Left Arm, Patient Position: Sitting, Cuff  Size: Large)   Pulse 86   Resp 16   Wt 222 lb (100.7 kg)   SpO2 97%   BMI 34.77 kg/m   Wt Readings from Last 5 Encounters:  09/28/22 222 lb (100.7 kg)  06/03/22 219 lb (99.3 kg)  12/30/21 229 lb 6.4 oz (104.1 kg)  05/25/21 224 lb 12.8 oz (102 kg)  02/21/21 226 lb (102.5 kg)     Physical Exam Vitals and nursing note reviewed.  Constitutional:      General: She is not in acute distress.    Appearance: She is well-developed.  Cardiovascular:     Rate and Rhythm: Normal rate and regular rhythm.  Pulmonary:     Effort: Pulmonary effort is normal.     Breath sounds: Normal breath sounds.  Neurological:     Mental Status: She is alert and oriented to person, place, and time.       Assessment & Plan:   Uncontrolled type 2 diabetes mellitus with hyperglycemia (HCC) - POCT glycosylated hemoglobin (Hb A1C) - CBC - Comprehensive metabolic panel - UYQIHKVQQVZ,5.63 or 0.5MG /DOS, 2 MG/3ML SOPN; Inject 0.25 mg into the skin once a week.  Dispense: 3 mL; Refill: 0   2. Lipid screening  - Lipid Panel   Follow up:  Follow up in 3 months     Ivonne Andrew, NP 09/28/2022

## 2022-09-28 NOTE — Assessment & Plan Note (Signed)
-   POCT glycosylated hemoglobin (Hb A1C) - CBC - Comprehensive metabolic panel - UYQIHKVQQVZ,5.63 or 0.5MG /DOS, 2 MG/3ML SOPN; Inject 0.25 mg into the skin once a week.  Dispense: 3 mL; Refill: 0   2. Lipid screening  - Lipid Panel   Follow up:  Follow up in 3 months

## 2022-09-28 NOTE — Patient Instructions (Addendum)
1. Uncontrolled type 2 diabetes mellitus with hyperglycemia (HCC)  - POCT glycosylated hemoglobin (Hb A1C) - CBC - Comprehensive metabolic panel - WNUUVOZDGUY,4.03 or 0.5MG /DOS, 2 MG/3ML SOPN; Inject 0.25 mg into the skin once a week.  Dispense: 3 mL; Refill: 0   2. Lipid screening  - Lipid Panel   Follow up:  Follow up in 3 months

## 2022-09-29 ENCOUNTER — Other Ambulatory Visit: Payer: Self-pay | Admitting: Nurse Practitioner

## 2022-09-29 LAB — CBC
Hematocrit: 43.4 % (ref 34.0–46.6)
Hemoglobin: 13.1 g/dL (ref 11.1–15.9)
MCH: 22.3 pg — ABNORMAL LOW (ref 26.6–33.0)
MCHC: 30.2 g/dL — ABNORMAL LOW (ref 31.5–35.7)
MCV: 74 fL — ABNORMAL LOW (ref 79–97)
Platelets: 391 10*3/uL (ref 150–450)
RBC: 5.87 x10E6/uL — ABNORMAL HIGH (ref 3.77–5.28)
RDW: 13.7 % (ref 11.7–15.4)
WBC: 9.1 10*3/uL (ref 3.4–10.8)

## 2022-09-29 LAB — COMPREHENSIVE METABOLIC PANEL
ALT: 14 IU/L (ref 0–32)
AST: 15 IU/L (ref 0–40)
Albumin: 3.8 g/dL — ABNORMAL LOW (ref 3.9–4.9)
Alkaline Phosphatase: 66 IU/L (ref 44–121)
BUN/Creatinine Ratio: 8 — ABNORMAL LOW (ref 9–23)
BUN: 6 mg/dL (ref 6–24)
Bilirubin Total: 0.4 mg/dL (ref 0.0–1.2)
CO2: 23 mmol/L (ref 20–29)
Calcium: 8.9 mg/dL (ref 8.7–10.2)
Chloride: 102 mmol/L (ref 96–106)
Creatinine, Ser: 0.75 mg/dL (ref 0.57–1.00)
Globulin, Total: 2.8 g/dL (ref 1.5–4.5)
Glucose: 197 mg/dL — ABNORMAL HIGH (ref 70–99)
Potassium: 4 mmol/L (ref 3.5–5.2)
Sodium: 139 mmol/L (ref 134–144)
Total Protein: 6.6 g/dL (ref 6.0–8.5)
eGFR: 101 mL/min/{1.73_m2} (ref >59)

## 2022-09-29 LAB — LIPID PANEL
Chol/HDL Ratio: 4.9 ratio — ABNORMAL HIGH (ref 0.0–4.4)
Cholesterol, Total: 211 mg/dL — ABNORMAL HIGH (ref 100–199)
HDL: 43 mg/dL (ref >39)
LDL Chol Calc (NIH): 126 mg/dL — ABNORMAL HIGH (ref 0–99)
Triglycerides: 236 mg/dL — ABNORMAL HIGH (ref 0–149)
VLDL Cholesterol Cal: 42 mg/dL — ABNORMAL HIGH (ref 5–40)

## 2022-09-29 MED ORDER — ATORVASTATIN CALCIUM 20 MG PO TABS: 20.0000 mg | ORAL_TABLET | Freq: Every day | ORAL | 11 refills | Status: DC

## 2022-10-05 ENCOUNTER — Telehealth: Payer: Self-pay

## 2022-10-05 NOTE — Progress Notes (Unsigned)
   Care Guide Note  10/05/2022 Name: [name] MRN: [id number] DOB: [birth year]  Referred by: Ivonne Andrew, NP Reason for referral : Care Coordination (Outreach to schedule with Pharm d )   [name] is a 42 y.o. year old female who is a primary care patient of Ivonne Andrew, NP. [name] was referred to the pharmacist for assistance related to DM.    An unsuccessful telephone outreach was attempted today to contact the patient who was referred to the pharmacy team for assistance with medication management. Additional attempts will be made to contact the patient.   Penne Lash, RMA Care Guide [facility]  [place], [place] [address] Direct Dial: [phone] [email or web address]

## 2022-10-06 NOTE — Progress Notes (Unsigned)
   Care Guide Note  10/06/2022 Name: [name] MRN: [id number] DOB: 08-Jul-1980  Referred by: Ivonne Andrew, NP Reason for referral : Care Coordination (Outreach to schedule with Pharm d )   [name] is a 42 y.o. year old female who is a primary care patient of Ivonne Andrew, NP. [name] was referred to the pharmacist for assistance related to DM.    A second unsuccessful telephone outreach was attempted today to contact the patient who was referred to the pharmacy team for assistance with medication management. Additional attempts will be made to contact the patient.  Penne Lash, RMA Care Guide [facility]  [place], [place] [address] Direct Dial: [phone] [email or web address]

## 2022-10-09 NOTE — Progress Notes (Signed)
   Care Guide Note  10/09/2022 Name: [name] MRN: [id number] DOB: [birth year]  Referred by: Ivonne Andrew, NP Reason for referral : Care Coordination (Outreach to schedule with Pharm d )   [name] is a 42 y.o. year old female who is a primary care patient of Ivonne Andrew, NP. [name] was referred to the pharmacist for assistance related to DM.    Successful contact was made with the patient to discuss pharmacy services. Patient declines engagement at this time. Contact information was provided to the patient should they wish to reach out for assistance at a later time.  Penne Lash, RMA Care Guide [facility]  [place], [place] [address] Direct Dial: [phone] [email or web address]

## 2022-11-08 ENCOUNTER — Other Ambulatory Visit: Payer: Self-pay | Admitting: Nurse Practitioner

## 2022-11-08 DIAGNOSIS — E1165 Type 2 diabetes mellitus with hyperglycemia: Secondary | ICD-10-CM

## 2022-12-05 ENCOUNTER — Ambulatory Visit: Payer: 59

## 2022-12-05 ENCOUNTER — Ambulatory Visit
Admission: EM | Admit: 2022-12-05 | Discharge: 2022-12-05 | Disposition: A | Discharge status: Home / Self Care | Payer: 59 | Attending: Physician Assistant | Admitting: Physician Assistant

## 2022-12-05 DIAGNOSIS — M25572 Pain in left ankle and joints of left foot: Secondary | ICD-10-CM

## 2022-12-05 DIAGNOSIS — M25571 Pain in right ankle and joints of right foot: Secondary | ICD-10-CM

## 2022-12-05 NOTE — ED Provider Notes (Signed)
[facility]    CSN: [id number] Arrival date & time: 12/05/22  1255      History   Chief Complaint Chief Complaint  Patient presents with   Ankle Pain (Bilateral)    New Injury.     HPI [name] is a 42 y.o. female.   Patient here today for evaluation of bilateral ankle pain.  She reports that she does have history of injury to her right ankle.  She notes that she was recently trying to help get her grandmother out of a bed and she fell twisting her left ankle and hit to the lateral aspect of her right ankle on a side table.  Injury occurred a few days ago.  She notes she continues to have pain and swelling which concerned her.  She is able to ambulate.  She denies any numbness or tingling.  The history is provided by the patient.    Past Medical History:  Diagnosis Date   Anemia    Anxiety 03/2019   Diabetes mellitus without complication (HCC)    Insomnia 04/07/2019   Kidney stone    Low iron stores 12/2019   Migraine    Vitamin D deficiency 03/2019    Patient Active Problem List   Diagnosis Date Noted   Need for Tdap vaccination 12/30/2021   Vitamin D deficiency 12/30/2021   Uncontrolled type 2 diabetes mellitus with hyperglycemia (HCC) 05/26/2021   Nonintractable headache 04/10/2019   Anxiety 04/10/2019   Insomnia 04/10/2019    Past Surgical History:  Procedure Laterality Date   CESAREAN SECTION     TUBAL LIGATION Left June 2012   WISDOM TOOTH EXTRACTION      OB History     Gravida  6   Para  3   Term  3   Preterm      AB  3   Living  3      SAB  1   IAB  1   Ectopic  1   Multiple      Live Births               Home Medications    Prior to Admission medications   Medication Sig Start Date End Date Taking? Authorizing Provider  cetirizine (ZYRTEC) 10 MG tablet Take 1 tablet (10 mg total) by mouth daily. 05/25/21  Yes Ivonne Andrew, NP  Semaglutide,0.25 or 0.5MG /DOS, (OZEMPIC, 0.25 OR 0.5 MG/DOSE,) 2  MG/3ML SOPN INJECT 0.25MG  INTO THE SKIN ONE TIME PER WEEK 11/08/22  Yes Ivonne Andrew, NP  traZODone (DESYREL) 100 MG tablet Take 1 tablet (100 mg total) by mouth at bedtime. 09/28/22  Yes Ivonne Andrew, NP  Accu-Chek Softclix Lancets lancets Use as instructed to check blood sugar BID 02/10/22   Ivonne Andrew, NP  atorvastatin (LIPITOR) 20 MG tablet Take 1 tablet (20 mg total) by mouth daily. 09/29/22 09/29/23  Ivonne Andrew, NP  blood glucose meter kit and supplies KIT Dispense based on patient and insurance preference. Use up to four times daily as directed. 12/30/21   Paseda, Baird Kay, FNP  escitalopram (LEXAPRO) 10 MG tablet Take 1 tablet (10 mg total) by mouth daily. Patient not taking: Reported on 12/30/2021 05/25/21 06/24/21  Ivonne Andrew, NP  ferrous sulfate 325 (65 FE) MG EC tablet Take 1 tablet (325 mg total) by mouth daily. 08/16/20 12/30/21  Barbette Merino, NP  glucose blood (ACCU-CHEK GUIDE) test strip Use as instructed to check blood sugars  BID 02/10/22   Ivonne Andrew, NP  ibuprofen (ADVIL) 800 MG tablet Take by mouth. 06/02/22   [provider]  meloxicam (MOBIC) 7.5 MG tablet Take 1 tablet (7.5 mg total) by mouth daily. 07/22/21   Crain, Whitney L, PA  tiZANidine (ZANAFLEX) 4 MG tablet TAKE 1 TABLET BY MOUTH EVERY 6 HOURS AS NEEDED FOR MUSCLE SPASMS. 12/11/22   Ivonne Andrew, NP  Vitamin D, Ergocalciferol, (DRISDOL) 1.25 MG (50000 UNIT) CAPS capsule Take 1 capsule (50,000 Units total) by mouth every 7 (seven) days. 08/16/20   Barbette Merino, NP  topiramate (TOPAMAX) 25 MG tablet TAKE 1 TABLET BY MOUTH TWICE A DAY 07/01/19 09/24/19  Kallie Locks, FNP    Family History Family History  Problem Relation Age of Onset   Diabetes Mother    Hypertension Mother    Healthy Father    Breast cancer Maternal Aunt    Breast cancer Maternal Grandmother    Diabetes Maternal Grandmother    Hypertension Maternal Grandmother    Cancer Maternal Grandmother    Diabetes  Maternal Grandfather    Hypertension Maternal Grandfather    Breast cancer Cousin     Social History Social History   Tobacco Use   Smoking status: Never   Smokeless tobacco: Never  Vaping Use   Vaping status: Never Used  Substance Use Topics   Alcohol use: Yes    Comment: Occassionally.   Drug use: Never     Allergies   Patient has no known allergies.   Review of Systems Review of Systems  Constitutional:  Negative for chills and fever.  Eyes:  Negative for discharge and redness.  Gastrointestinal:  Negative for abdominal pain, nausea and vomiting.  Musculoskeletal:  Positive for arthralgias.  Neurological:  Negative for numbness.     Physical Exam Triage Vital Signs ED Triage Vitals  Encounter Vitals Group     BP 12/05/22 1321 106/67     Systolic BP Percentile --      Diastolic BP Percentile --      Pulse Rate 12/05/22 1321 83     Resp 12/05/22 1321 18     Temp 12/05/22 1321 98.4 F (36.9 C)     Temp Source 12/05/22 1321 Oral     SpO2 12/05/22 1321 96 %     Weight 12/05/22 1319 228 lb (103.4 kg)     Height 12/05/22 1319 5\' 7"  (1.702 m)     Head Circumference --      Peak Flow --      Pain Score 12/05/22 1318 10     Pain Loc --      Pain Education --      Exclude from Growth Chart --    No data found.  Updated Vital Signs BP 106/67 (BP Location: Left Arm)   Pulse 83   Temp 98.4 F (36.9 C) (Oral)   Resp 18   Ht 5\' 7"  (1.702 m)   Wt 228 lb (103.4 kg)   LMP 11/13/2022 (Exact Date)   SpO2 96%   BMI 35.71 kg/m   Visual Acuity Right Eye Distance:   Left Eye Distance:   Bilateral Distance:    Right Eye Near:   Left Eye Near:    Bilateral Near:     Physical Exam Vitals and nursing note reviewed.  Constitutional:      General: She is not in acute distress.    Appearance: Normal appearance. She is not ill-appearing.  HENT:  Head: Normocephalic and atraumatic.  Eyes:     Conjunctiva/sclera: Conjunctivae normal.  Cardiovascular:      Rate and Rhythm: Normal rate.  Pulmonary:     Effort: Pulmonary effort is normal.  Musculoskeletal:     Comments: Minimal swelling appreciated to left ankle, mildly decreased range of motion of both ankles left worse than right.  Neurological:     Mental Status: She is alert.  Psychiatric:        Mood and Affect: Mood normal.        Behavior: Behavior normal.        Thought Content: Thought content normal.      UC Treatments / Results  Labs (all labs ordered are listed, but only abnormal results are displayed) Labs Reviewed - No data to display  EKG   Radiology No results found.  Procedures Procedures (including critical care time)  Medications Ordered in UC Medications - No data to display  Initial Impression / Assessment and Plan / UC Course  I have reviewed the triage vital signs and the nursing notes.  Pertinent labs & imaging results that were available during my care of the patient were reviewed by me and considered in my medical decision making (see chart for details).    X-rays without acute findings.  Recommend continued monitoring of symptoms, symptomatic treatment, ice and elevation as needed.  Encouraged follow-up if symptoms do not improve or worsen.  Final Clinical Impressions(s) / UC Diagnoses   Final diagnoses:  Acute bilateral ankle pain   Discharge Instructions   None    ED Prescriptions   None    PDMP not reviewed this encounter.   Tomi Bamberger, PA-C 12/27/22 1051

## 2022-12-05 NOTE — ED Triage Notes (Signed)
"  I had an old injury in my right ankle some time ago, recently moved into my new home and have air mattress's, I was trying to get Maternal Grandmother out/off of this mattress and fell twisting the left and hit the right ankle on the side table". DOI: 12-02-2022. Still having "pain and with swelling".

## 2022-12-11 ENCOUNTER — Other Ambulatory Visit: Payer: Self-pay | Admitting: Nurse Practitioner

## 2022-12-13 ENCOUNTER — Ambulatory Visit: Payer: Self-pay | Admitting: Nurse Practitioner

## 2023-01-09 ENCOUNTER — Other Ambulatory Visit: Payer: Self-pay | Admitting: Nurse Practitioner

## 2023-01-09 DIAGNOSIS — G4700 Insomnia, unspecified: Secondary | ICD-10-CM

## 2023-01-09 NOTE — Telephone Encounter (Signed)
Please advise KH 

## 2023-01-25 ENCOUNTER — Encounter: Payer: Self-pay | Admitting: Nurse Practitioner

## 2023-03-12 ENCOUNTER — Encounter: Payer: Self-pay | Admitting: Nurse Practitioner

## 2023-03-14 ENCOUNTER — Ambulatory Visit: Payer: 59

## 2023-03-14 ENCOUNTER — Ambulatory Visit
Admission: EM | Admit: 2023-03-14 | Discharge: 2023-03-14 | Disposition: A | Discharge status: Home / Self Care | Payer: 59 | Attending: Physician Assistant | Admitting: Physician Assistant

## 2023-03-14 ENCOUNTER — Encounter: Payer: Self-pay | Admitting: Emergency Medicine

## 2023-03-14 DIAGNOSIS — S161XXA Strain of muscle, fascia and tendon at neck level, initial encounter: Secondary | ICD-10-CM | POA: Diagnosis not present

## 2023-03-14 DIAGNOSIS — M898X1 Other specified disorders of bone, shoulder: Secondary | ICD-10-CM

## 2023-03-14 DIAGNOSIS — S93402A Sprain of unspecified ligament of left ankle, initial encounter: Secondary | ICD-10-CM

## 2023-03-14 DIAGNOSIS — M542 Cervicalgia: Secondary | ICD-10-CM

## 2023-03-14 DIAGNOSIS — W19XXXA Unspecified fall, initial encounter: Secondary | ICD-10-CM

## 2023-03-14 MED ORDER — IBUPROFEN 800 MG PO TABS: 800.0000 mg | ORAL_TABLET | Freq: Three times a day (TID) | ORAL | 0 refills | Status: DC

## 2023-03-14 MED ORDER — METHOCARBAMOL 500 MG PO TABS: 500.0000 mg | ORAL_TABLET | Freq: Two times a day (BID) | ORAL | 0 refills | Status: DC

## 2023-03-14 NOTE — ED Provider Notes (Signed)
[facility]    CSN: [id number] Arrival date & time: 03/14/23  1059      History   Chief Complaint Chief Complaint  Patient presents with   Shoulder Pain   Ankle Pain    HPI HILMA STEINHILBER is a 43 y.o. female.   Patient presents today with a 4-day history of ongoing worsening pain in her left neck/anterior shoulder and left ankle.  Reports that she was helping her disabled mother out of the shower to prevent her mother from falling when she fell down and hit her shoulder and ankle.  She did not hit her head and denies any loss of consciousness, severe headache, dizziness, nausea, vomiting, amnesia surrounding event.  She does not take any blood thinning medication.  She reports that the pain is rated "15" in her left anterior shoulder along her clavicle and neck.  She has difficulty rotating her neck as a result of the pain which has prevented her from sleeping through the night.  She is right-handed.  Denies any numbness or paresthesias in her extremities.  Denies previous injury or surgery involving her neck or shoulder.  She also reports pain that is rated "11" of her left ankle.  This is localized to the lateral ankle without radiation, described as sharp, worse with ambulation.  She has had previous sprains of her ankle but denies previous surgery.  She has been taking over-the-counter medication including previously prescribed tizanidine without improvement of symptoms.  She is confident that she is not pregnant.  She is concerned that there might be something broken given her ongoing and persistent pain.    Past Medical History:  Diagnosis Date   Anemia    Anxiety 03/2019   Diabetes mellitus without complication (HCC)    Insomnia 04/07/2019   Kidney stone    Low iron stores 12/2019   Migraine    Vitamin D deficiency 03/2019    Patient Active Problem List   Diagnosis Date Noted   Need for Tdap vaccination 12/30/2021   Vitamin D deficiency 12/30/2021    Uncontrolled type 2 diabetes mellitus with hyperglycemia (HCC) 05/26/2021   Nonintractable headache 04/10/2019   Anxiety 04/10/2019   Insomnia 04/10/2019    Past Surgical History:  Procedure Laterality Date   CESAREAN SECTION     TUBAL LIGATION Left June 2012   WISDOM TOOTH EXTRACTION      OB History     Gravida  6   Para  3   Term  3   Preterm      AB  3   Living  3      SAB  1   IAB  1   Ectopic  1   Multiple      Live Births               Home Medications    Prior to Admission medications   Medication Sig Start Date End Date Taking? Authorizing Provider  Accu-Chek Softclix Lancets lancets Use as instructed to check blood sugar BID 02/10/22  Yes Ivonne Andrew, NP  blood glucose meter kit and supplies KIT Dispense based on patient and insurance preference. Use up to four times daily as directed. 12/30/21  Yes Paseda, Phillips Grout R, FNP  glucose blood (ACCU-CHEK GUIDE) test strip Use as instructed to check blood sugars BID 02/10/22  Yes Ivonne Andrew, NP  ibuprofen (ADVIL) 800 MG tablet Take 1 tablet (800 mg total) by mouth 3 (three) times daily. 03/14/23  Yes Zettie Gootee K, PA-C  methocarbamol (ROBAXIN) 500 MG tablet Take 1 tablet (500 mg total) by mouth 2 (two) times daily. 03/14/23  Yes Suzie Vandam K, PA-C  Semaglutide,0.25 or 0.5MG /DOS, (OZEMPIC, 0.25 OR 0.5 MG/DOSE,) 2 MG/3ML SOPN INJECT 0.25MG  INTO THE SKIN ONE TIME PER WEEK 11/08/22  Yes Ivonne Andrew, NP  traZODone (DESYREL) 100 MG tablet TAKE 1 TABLET BY MOUTH EVERYDAY AT BEDTIME 01/09/23  Yes Ivonne Andrew, NP  atorvastatin (LIPITOR) 20 MG tablet Take 1 tablet (20 mg total) by mouth daily. Patient not taking: Reported on 03/14/2023 09/29/22 09/29/23  Ivonne Andrew, NP  cetirizine (ZYRTEC) 10 MG tablet Take 1 tablet (10 mg total) by mouth daily. 05/25/21   Ivonne Andrew, NP  escitalopram (LEXAPRO) 10 MG tablet Take 1 tablet (10 mg total) by mouth daily. Patient not taking: Reported on  12/30/2021 05/25/21 06/24/21  Ivonne Andrew, NP  ferrous sulfate 325 (65 FE) MG EC tablet Take 1 tablet (325 mg total) by mouth daily. 08/16/20 12/30/21  Barbette Merino, NP  Vitamin D, Ergocalciferol, (DRISDOL) 1.25 MG (50000 UNIT) CAPS capsule Take 1 capsule (50,000 Units total) by mouth every 7 (seven) days. Patient not taking: Reported on 03/14/2023 08/16/20   Barbette Merino, NP  topiramate (TOPAMAX) 25 MG tablet TAKE 1 TABLET BY MOUTH TWICE A DAY 07/01/19 09/24/19  Kallie Locks, FNP    Family History Family History  Problem Relation Age of Onset   Diabetes Mother    Hypertension Mother    Healthy Father    Breast cancer Maternal Aunt    Breast cancer Maternal Grandmother    Diabetes Maternal Grandmother    Hypertension Maternal Grandmother    Cancer Maternal Grandmother    Diabetes Maternal Grandfather    Hypertension Maternal Grandfather    Breast cancer Cousin     Social History Social History   Tobacco Use   Smoking status: Never   Smokeless tobacco: Never  Vaping Use   Vaping status: Never Used  Substance Use Topics   Alcohol use: Yes    Comment: Occassionally.   Drug use: Never     Allergies   Patient has no known allergies.   Review of Systems Review of Systems  Constitutional:  Positive for activity change. Negative for appetite change, fatigue and fever.  Respiratory:  Negative for cough and shortness of breath.   Cardiovascular:  Negative for chest pain.  Gastrointestinal:  Negative for abdominal pain, diarrhea, nausea and vomiting.  Musculoskeletal:  Positive for arthralgias, gait problem, joint swelling and neck pain. Negative for back pain and myalgias.  Skin:  Negative for color change and wound.  Neurological:  Negative for dizziness, weakness, light-headedness, numbness and headaches.     Physical Exam Triage Vital Signs ED Triage Vitals [03/14/23 1407]  Encounter Vitals Group     BP 121/78     Systolic BP Percentile      Diastolic BP  Percentile      Pulse Rate 92     Resp 14     Temp 97.9 F (36.6 C)     Temp Source Oral     SpO2 98 %     Weight      Height      Head Circumference      Peak Flow      Pain Score 10     Pain Loc      Pain Education      Exclude from [employer]  Chart    No data found.  Updated Vital Signs BP 121/78 (BP Location: Left Arm)   Pulse 92   Temp 97.9 F (36.6 C) (Oral)   Resp 14   LMP 03/13/2023 (Exact Date)   SpO2 98%   Visual Acuity Right Eye Distance:   Left Eye Distance:   Bilateral Distance:    Right Eye Near:   Left Eye Near:    Bilateral Near:     Physical Exam Vitals reviewed.  Constitutional:      General: She is awake. She is not in acute distress.    Appearance: Normal appearance. She is well-developed. She is not ill-appearing.     Comments: Very pleasant female appears stated age in no acute distress sitting comfortably in exam room  HENT:     Head: Normocephalic and atraumatic.  Cardiovascular:     Rate and Rhythm: Normal rate and regular rhythm.     Heart sounds: Normal heart sounds, S1 normal and S2 normal. No murmur heard.    Comments: Capillary refill within 2 seconds left toes Pulmonary:     Effort: Pulmonary effort is normal.     Breath sounds: Normal breath sounds. No wheezing, rhonchi or rales.     Comments: Clear to auscultation bilaterally Musculoskeletal:     Left shoulder: Tenderness (Along clavicle and anterior shoulder.) present. No swelling, deformity or bony tenderness. Normal range of motion.     Cervical back: Tenderness and bony tenderness present. No spasms. Pain with movement present.     Thoracic back: No tenderness or bony tenderness.     Lumbar back: No tenderness or bony tenderness.     Left ankle: Swelling present. Tenderness present over the lateral malleolus. Decreased range of motion.     Comments: Neck/back: Pain percussion of cervical vertebrae particularly between C5 and C7.  No deformity or step-off noted.  Decreased  range of motion with rotation secondary to pain.  Tenderness palpation along left trapezius.  Left shoulder: Decreased range of motion with overhead flexion, internal and external rotation secondary to pain.  Tenderness along clavicle but no bony tenderness in the shoulder or upper arm.  No deformity noted.  Hand is neurovascularly intact.  Left ankle: Tender to palpation over lateral malleolus.  Foot is neurovascularly intact.  Normal dorsiflexion and plantarflexion.  No erythema or wounds noted.    Psychiatric:        Behavior: Behavior is cooperative.      UC Treatments / Results  Labs (all labs ordered are listed, but only abnormal results are displayed) Labs Reviewed - No data to display  EKG   Radiology No results found.  Procedures Procedures (including critical care time)  Medications Ordered in UC Medications - No data to display  Initial Impression / Assessment and Plan / UC Course  I have reviewed the triage vital signs and the nursing notes.  Pertinent labs & imaging results that were available during my care of the patient were reviewed by me and considered in my medical decision making (see chart for details).     Patient is well-appearing, afebrile, nontoxic, nontachycardic.  No indication for head or neck CT based on [place] rules.  X-ray of cervical spine, clavicle, ankle obtained that showed degenerative changes in cervical spine as well as ankle without acute osseous abnormality.  This is based on my primary read and we are waiting for radiologist over read at the time of discharge so we will contact her if this differs  and changes her treatment plan.  Will start ibuprofen up to 3 times a day for pain relief.  Discussed that she is not to take NSAIDs with this medication due to risk of GI bleeding.  She can use acetaminophen/Tylenol for additional pain relief.  Recommended she keep affected areas elevated and use heat as well as gentle stretch for symptom  relief.  She was given methocarbamol for additional pain relief with instruction not to drive or drink alcohol taking this medication as drowsiness is a common side effect.  Recommended close follow-up with orthopedics if her symptoms are not improving.  She was given the contact information for local provider with instruction to call to schedule an appointment.  Discussed that if she has any worsening or changing symptoms she needs to be seen immediately.  Strict return precautions given.  Excuse note provided.  Final Clinical Impressions(s) / UC Diagnoses   Final diagnoses:  Neck pain  Fall, initial encounter  Sprain of left ankle, unspecified ligament, initial encounter  Strain of neck muscle, initial encounter  Pain of left clavicle     Discharge Instructions      I did not see anything broken or out of place on your x-rays.  If the radiologist sees something that I did not see I will call you.  Take ibuprofen 800 mg up to 3 times a day.  Do not take additional NSAIDs with this medication including aspirin, ibuprofen/Advil, naproxen/Aleve.  You can use acetaminophen/Tylenol as needed.  Use heat and gentle stretch to help with your pain.  Start methocarbamol twice a day.  This will make you sleepy so do not drive or drink alcohol with taking it.  This replaces your tizanidine.  If your symptoms are not improving with this medication regimen and a few days of rest please follow-up with orthopedics; call to schedule an appointment.  If anything worsens and you have increasing pain, numbness or tingling in your hands or legs, difficulty walking without assistance you need to be seen immediately.     ED Prescriptions     Medication Sig Dispense Auth. Provider   ibuprofen (ADVIL) 800 MG tablet Take 1 tablet (800 mg total) by mouth 3 (three) times daily. 21 tablet Zakiya Sporrer K, PA-C   methocarbamol (ROBAXIN) 500 MG tablet Take 1 tablet (500 mg total) by mouth 2 (two) times daily. 20 tablet  Ziare Orrick, Noberto Retort, PA-C      PDMP not reviewed this encounter.   Jeani Hawking, PA-C 03/14/23 1519

## 2023-03-14 NOTE — ED Triage Notes (Signed)
Pt reports left shoulder pain and L ankle pain x4 days after she fell trying to help her mom out of shower where she had fallen. Reports aching, sharp, and throbbing pain to both area. Very limited ROM in L shoulder with swelling to area. Pain with ROM in ankle. Pt reports no relief with ankle brace at home. Pt reports tizanidine available at home gives temporary relief. Requesting xrays.

## 2023-03-14 NOTE — Discharge Instructions (Signed)
I did not see anything broken or out of place on your x-rays.  If the radiologist sees something that I did not see I will call you.  Take ibuprofen 800 mg up to 3 times a day.  Do not take additional NSAIDs with this medication including aspirin, ibuprofen/Advil, naproxen/Aleve.  You can use acetaminophen/Tylenol as needed.  Use heat and gentle stretch to help with your pain.  Start methocarbamol twice a day.  This will make you sleepy so do not drive or drink alcohol with taking it.  This replaces your tizanidine.  If your symptoms are not improving with this medication regimen and a few days of rest please follow-up with orthopedics; call to schedule an appointment.  If anything worsens and you have increasing pain, numbness or tingling in your hands or legs, difficulty walking without assistance you need to be seen immediately.

## 2023-04-05 ENCOUNTER — Other Ambulatory Visit: Payer: Self-pay | Admitting: Nurse Practitioner

## 2023-04-05 DIAGNOSIS — E1165 Type 2 diabetes mellitus with hyperglycemia: Secondary | ICD-10-CM

## 2023-04-09 ENCOUNTER — Encounter: Payer: Self-pay | Admitting: Nurse Practitioner

## 2023-04-27 IMAGING — DX DG SACRUM/COCCYX 2+V
3 series · 3 of 3 positions shown · non-contrast
Comparison: CT 09/25/2016

CLINICAL DATA: Fell down stairs

EXAM:
SACRUM AND COCCYX - 2+ VIEW

[sacrum 20° caudo-cranial ap]
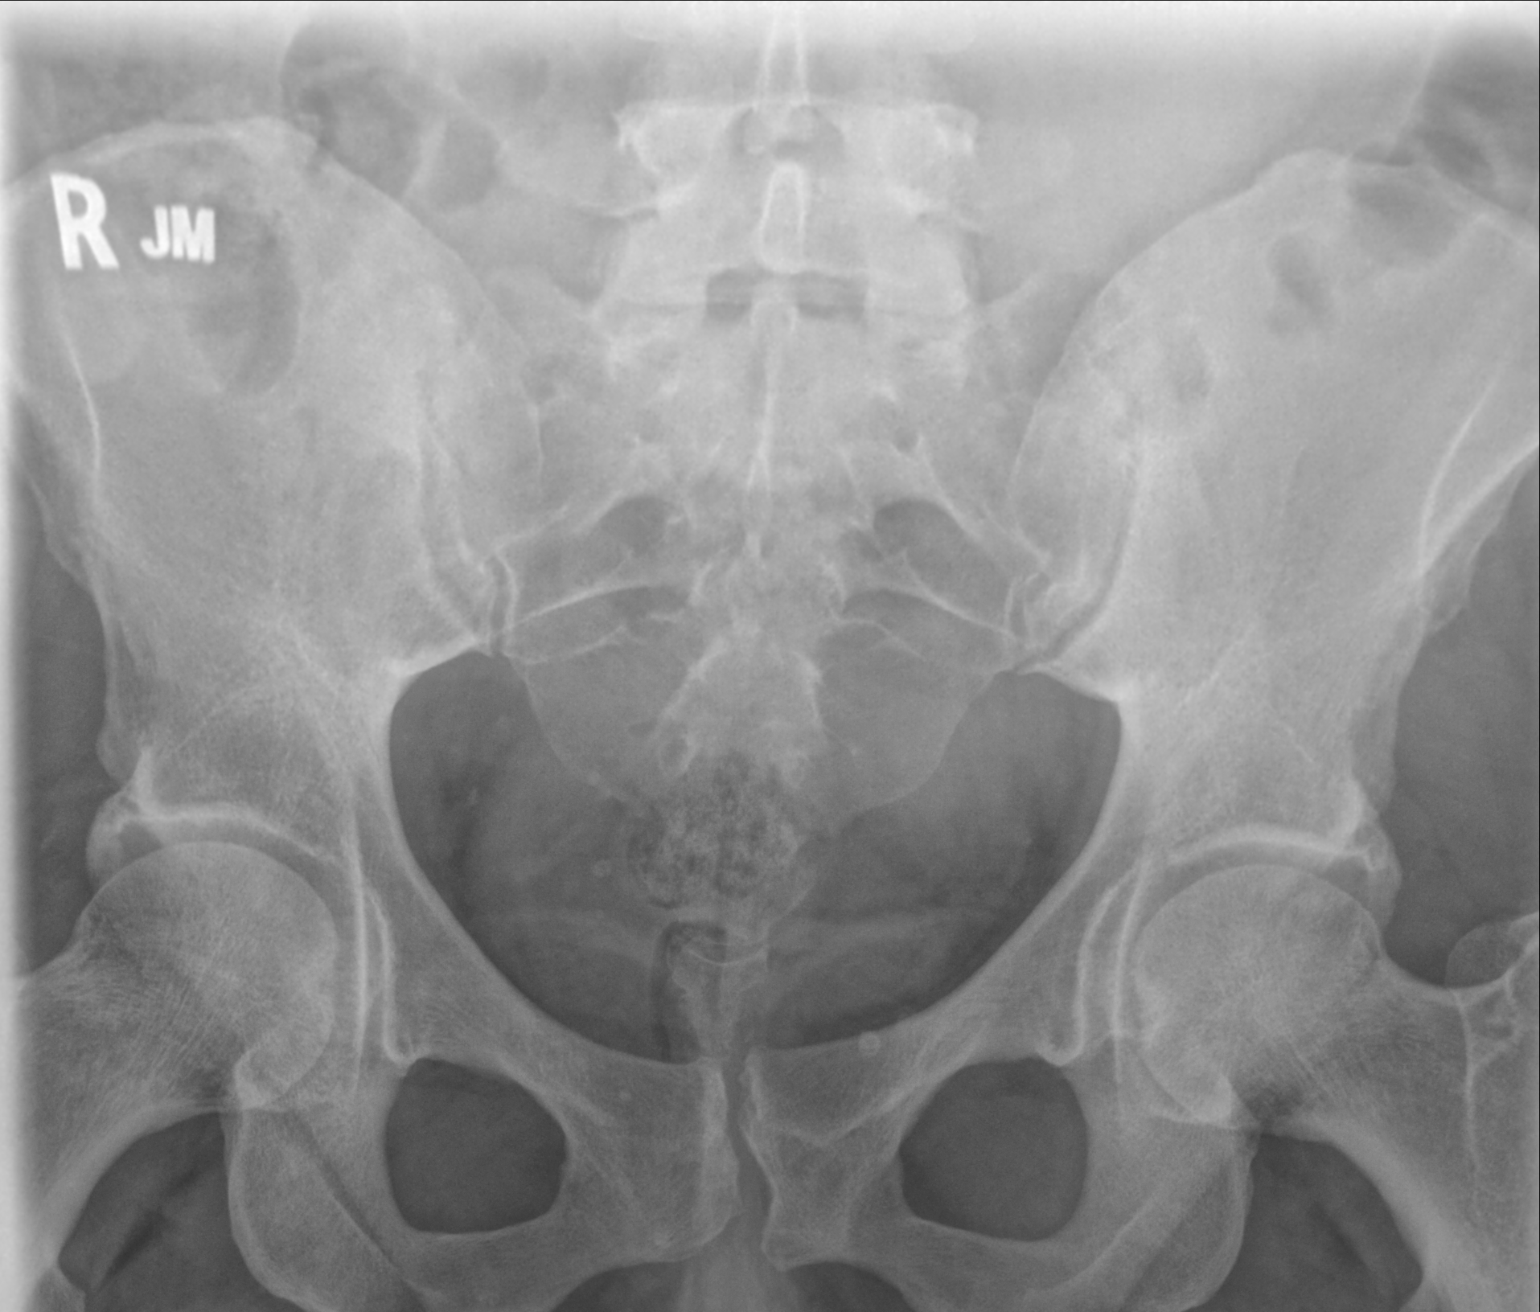

[coccyx 20° cranio-caudal ap]
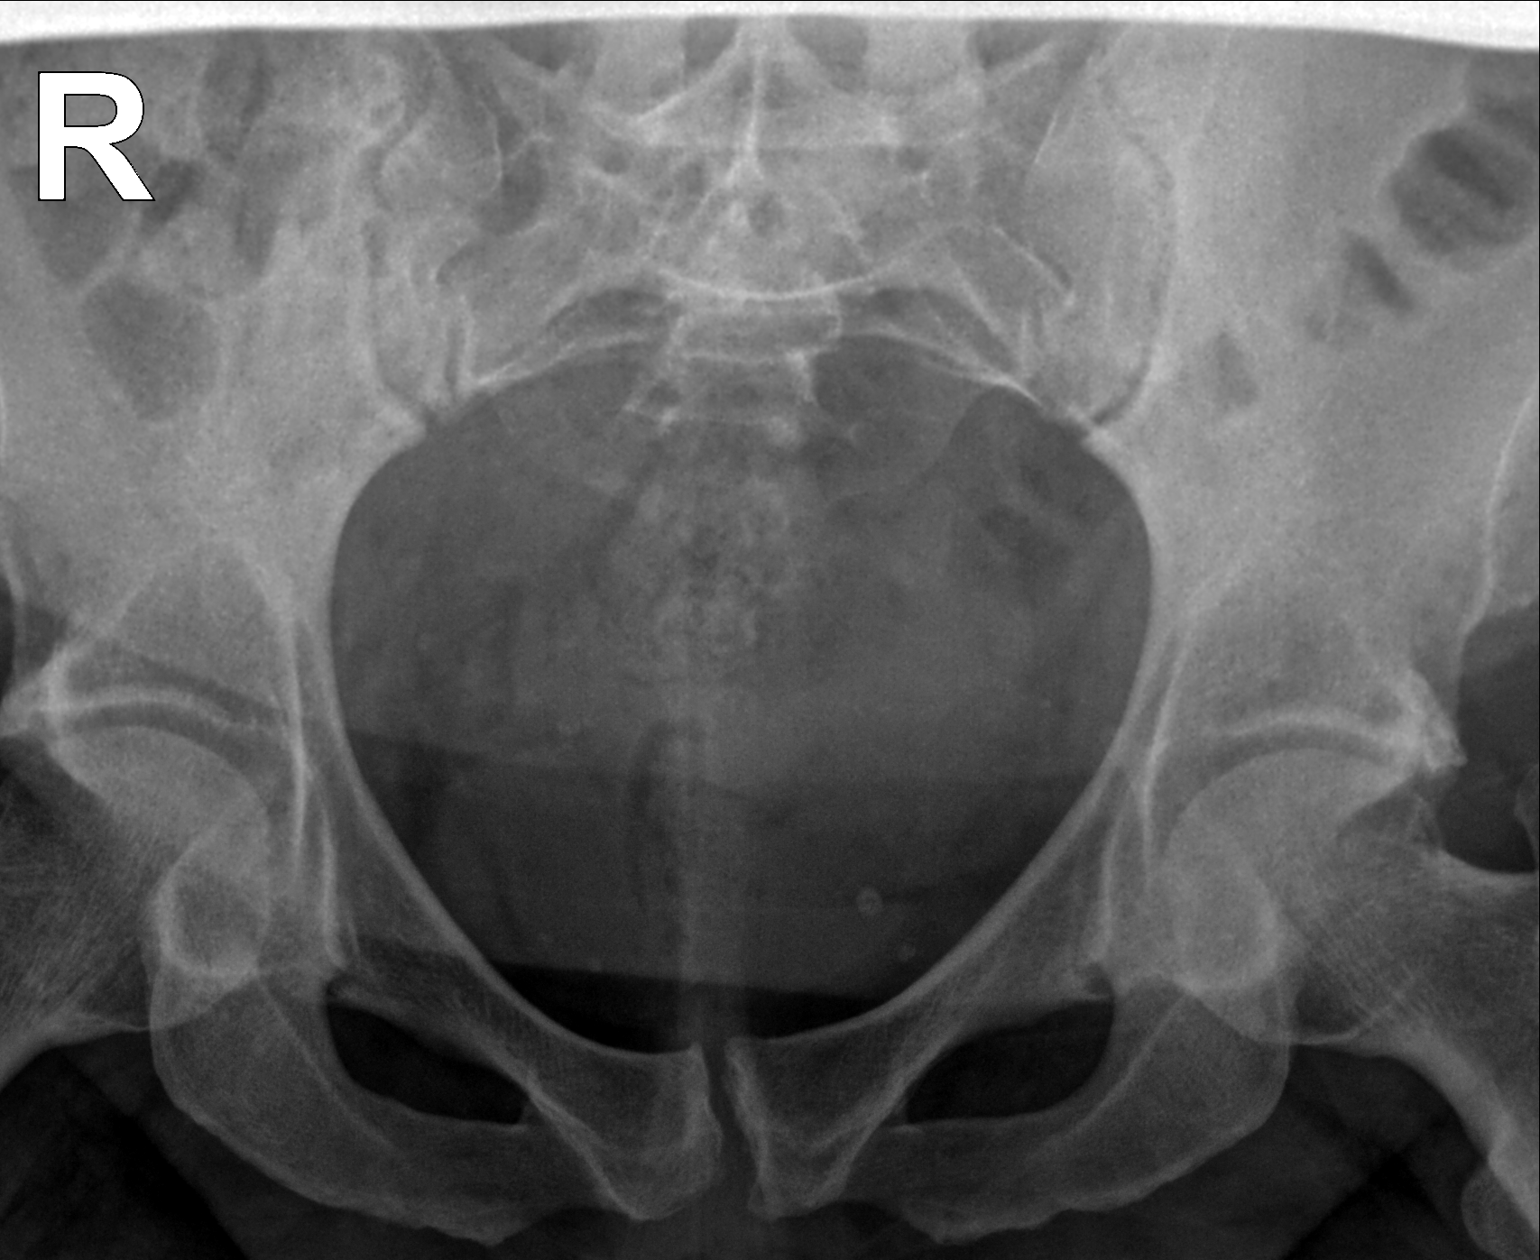

[sacrum lat]
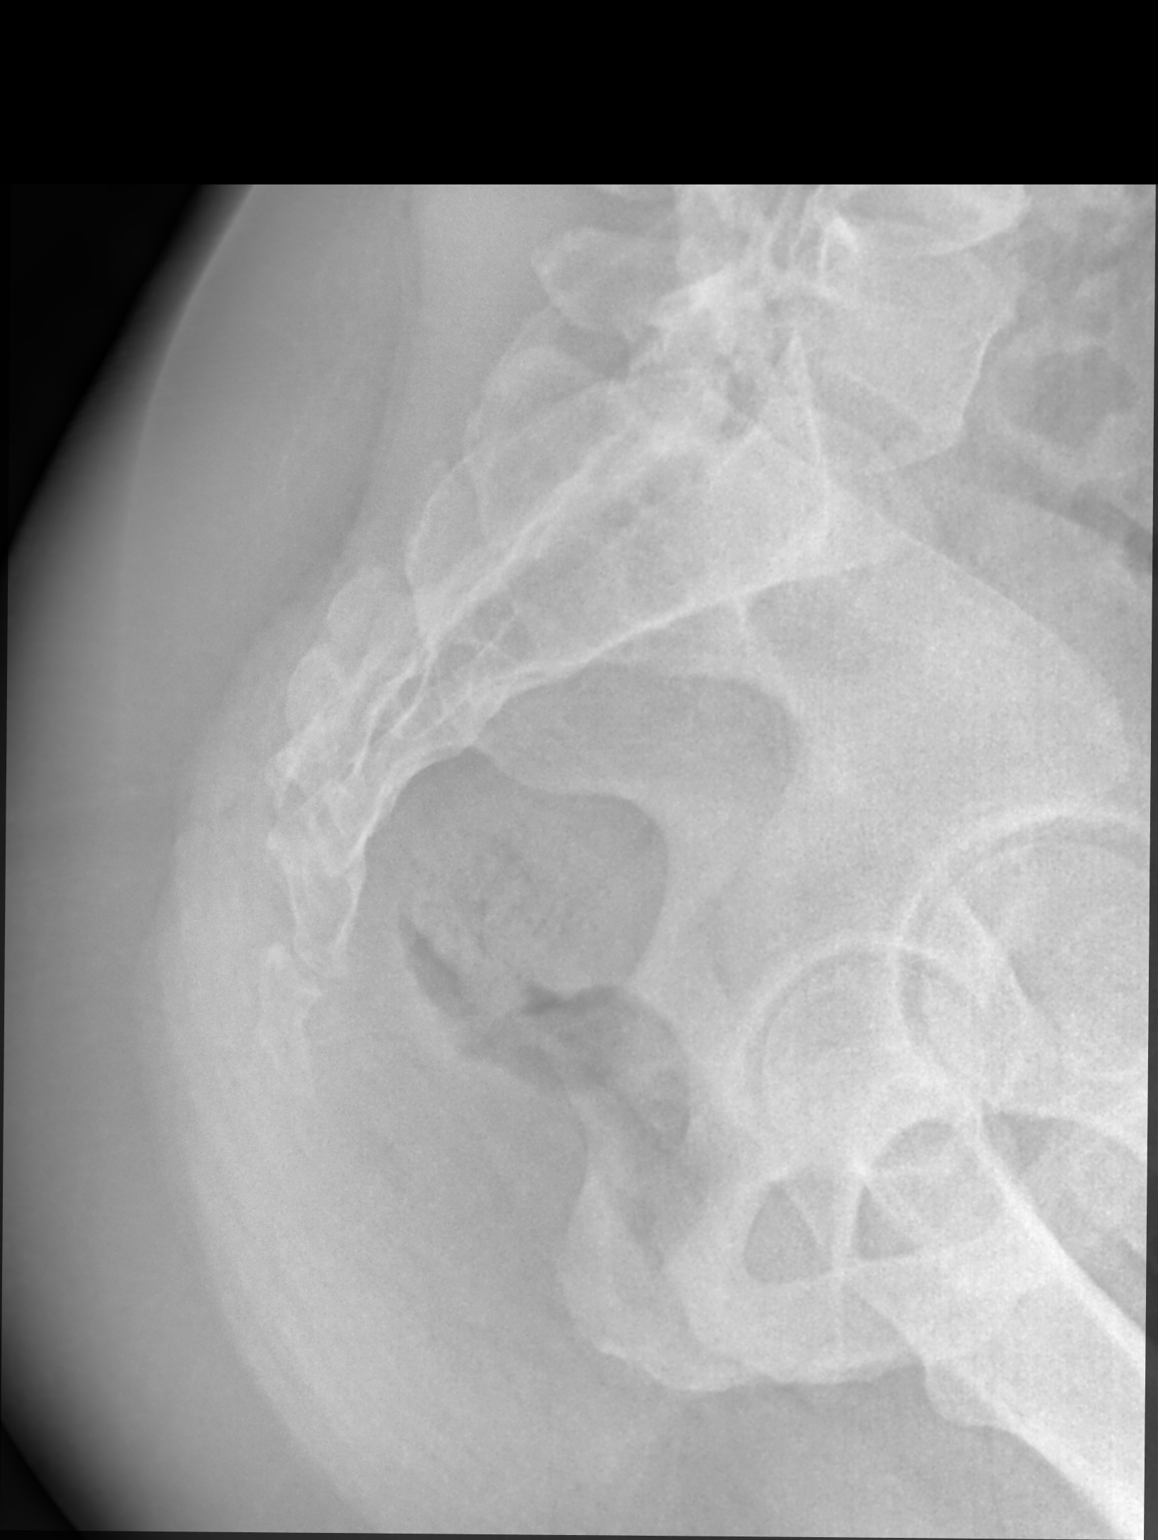

[3 of 3 positions shown; findings below may reference images not displayed]

FINDINGS: There is no evidence of fracture or other focal bone lesions.
Minimal chronic deformity at the sacrococcygeal region.
IMPRESSION: No acute osseous abnormality

## 2023-04-27 IMAGING — DX DG ANKLE COMPLETE 3+V*R*
3 series · 3 of 3 positions shown · non-contrast
Comparison: 05/20/2020

CLINICAL DATA: Fell down stairs

EXAM:
RIGHT ANKLE - COMPLETE 3+ VIEW

[ankle ap]
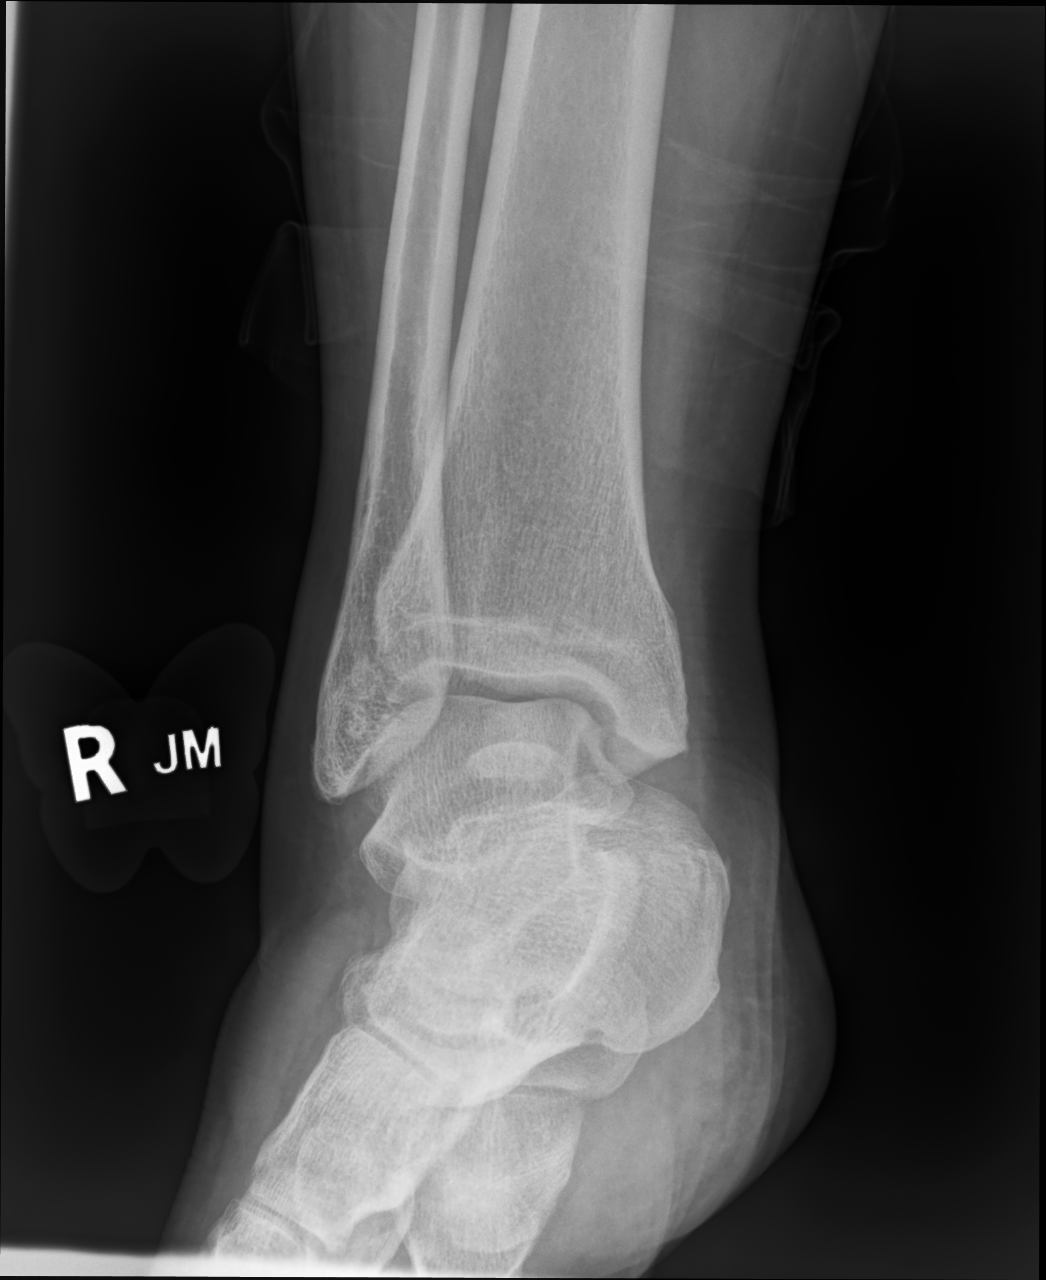

[ankle medial oblique]
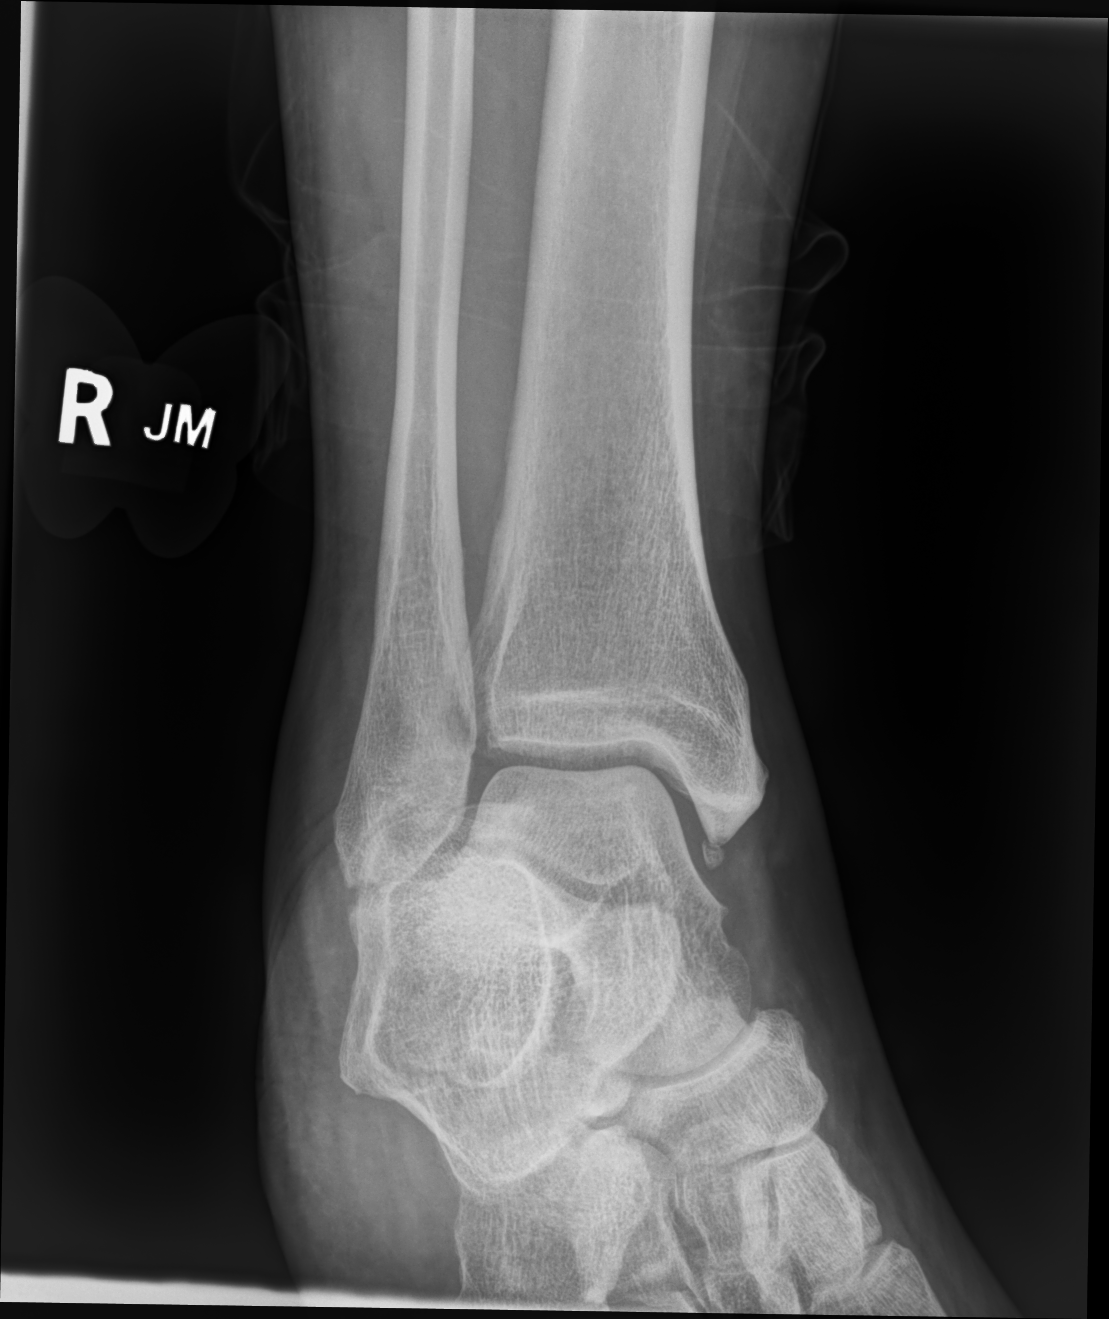

[ankle lat]
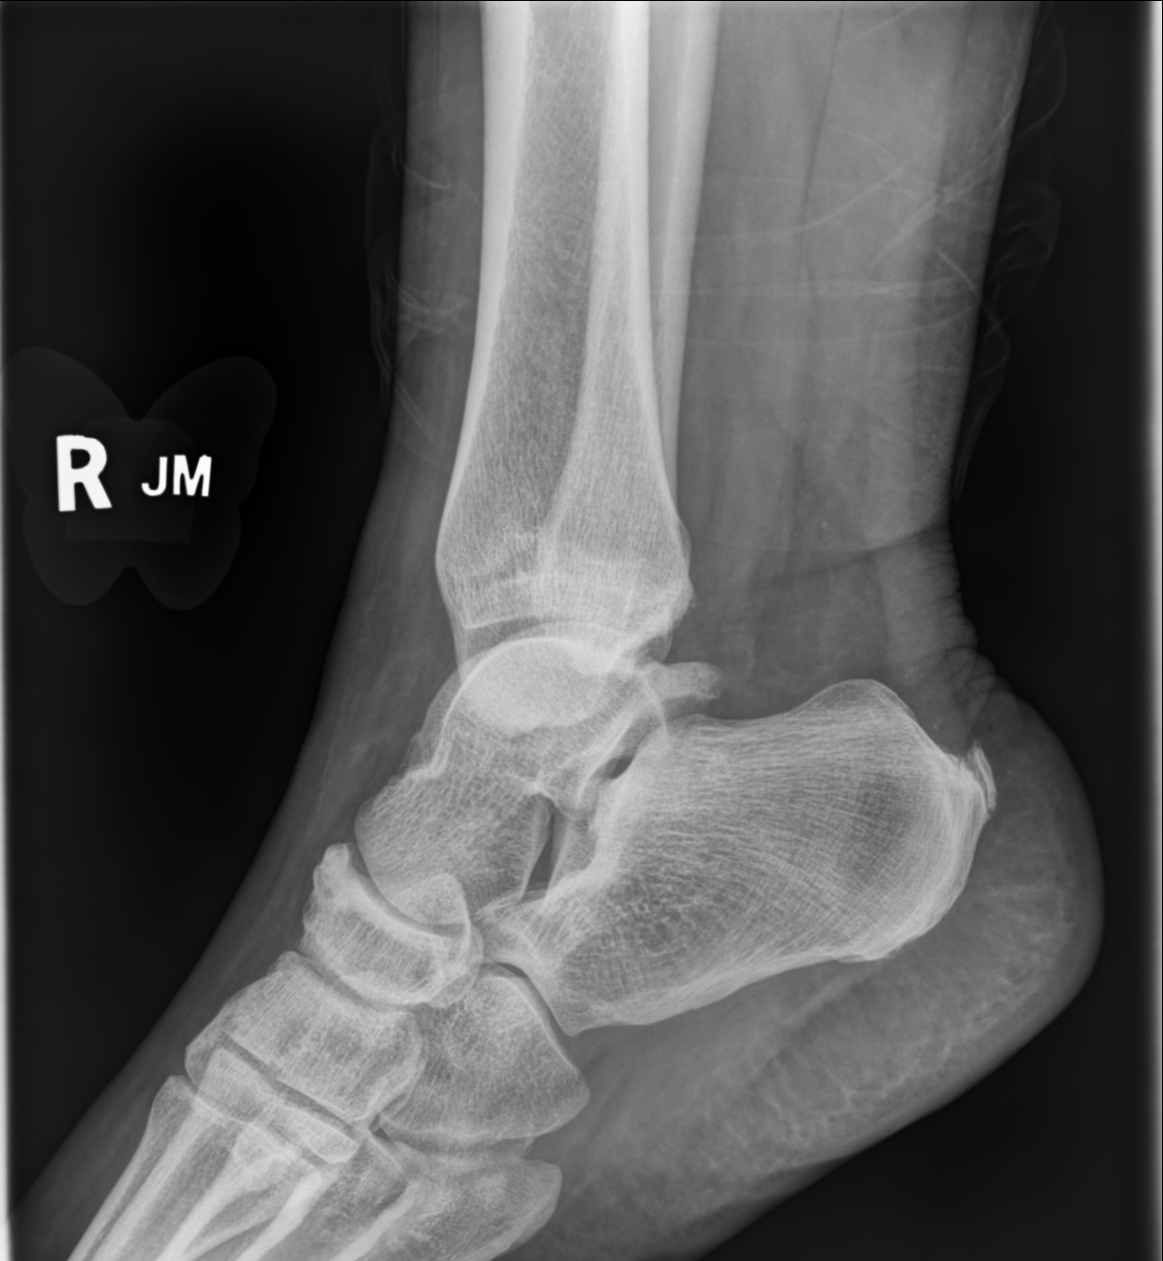

[3 of 3 positions shown; findings below may reference images not displayed]

FINDINGS: No acute displaced fracture or malalignment. Chronic ossicle or
fracture adjacent to the medial malleolar tip. Ankle mortise is
symmetric.
IMPRESSION: No acute osseous abnormality

## 2023-05-19 ENCOUNTER — Ambulatory Visit
Admission: EM | Admit: 2023-05-19 | Discharge: 2023-05-19 | Disposition: A | Discharge status: Home / Self Care | Attending: Family Medicine | Admitting: Family Medicine

## 2023-05-19 ENCOUNTER — Ambulatory Visit (INDEPENDENT_AMBULATORY_CARE_PROVIDER_SITE_OTHER)

## 2023-05-19 DIAGNOSIS — S20212A Contusion of left front wall of thorax, initial encounter: Secondary | ICD-10-CM | POA: Diagnosis not present

## 2023-05-19 DIAGNOSIS — R0781 Pleurodynia: Secondary | ICD-10-CM

## 2023-05-19 DIAGNOSIS — R0789 Other chest pain: Secondary | ICD-10-CM | POA: Diagnosis not present

## 2023-05-19 MED ORDER — DOXYCYCLINE HYCLATE 100 MG PO CAPS: 100.0000 mg | ORAL_CAPSULE | Freq: Two times a day (BID) | ORAL | 0 refills | Status: DC

## 2023-05-19 MED ORDER — HYDROCODONE-ACETAMINOPHEN 5-325 MG PO TABS: 1.0000 | ORAL_TABLET | Freq: Four times a day (QID) | ORAL | 0 refills | Status: DC | PRN

## 2023-05-19 NOTE — ED Triage Notes (Signed)
 Pt c/o bruise on underarm x 4 days. Pt denies injury but states she collided with her moms wheelchair handle. Pt states the affected area started as a bruise and grew into a knot.

## 2023-05-19 NOTE — Discharge Instructions (Signed)
 Be aware, you have been prescribed pain medications that may cause drowsiness. While taking this medication, do not take any other medications containing acetaminophen (Tylenol). Do not combine with alcohol or recreational drugs. Please do not drive, operate heavy machinery, or take part in activities that require making important decisions while on this medication as your judgement may be clouded.

## 2023-05-21 NOTE — ED Provider Notes (Signed)
 [facility]   [id number] 05/19/23 Arrival Time: 1012  ASSESSMENT & PLAN:  1. Rib pain on right side   2. Chest wall pain   3. Chest wall hematoma, left, initial encounter    Area over presumed small hematoma of L chest is a little red and warm. Will cover for possibility of skin infection also.  I have personally viewed and independently interpreted the imaging studies ordered this visit. R ribs with PA chest: no acute changes; no appreciable rib fx; no pneumothorax.  Begin: Meds ordered this encounter  Medications   doxycycline  (VIBRAMYCIN ) 100 MG capsule    Sig: Take 1 capsule (100 mg total) by mouth 2 (two) times daily.    Dispense:  14 capsule    Refill:  0   HYDROcodone -acetaminophen  (NORCO/VICODIN) 5-325 MG tablet    Sig: Take 1 tablet by mouth every 6 (six) hours as needed for moderate pain (pain score 4-6) or severe pain (pain score 7-10).    Dispense:  10 tablet    Refill:  0    Controlled Substances Registry consulted for this patient. I feel the risk/benefit ratio today is favorable for proceeding with this prescription for a controlled substance. Medication sedation precautions given.   Chest pain precautions given. Reviewed expectations re: course of current medical issues. Questions answered. Outlined signs and symptoms indicating need for more acute intervention. Patient verbalized understanding. After Visit Summary given.   SUBJECTIVE:  History from: patient. [name] is a 43 y.o. female who presents with complaint of pain of upper R chest wall s/p falling against her mother's wheelchair; immediate discomfort; scraped skin; now area a little red. OTC analgesics without much relief. Denies SOB.  Social History   Tobacco Use  Smoking Status Never  Smokeless Tobacco Never    OBJECTIVE:  Vitals:   05/19/23 1041  BP: 126/84  Pulse: 90  Resp: 18  Temp: 98.4 F (36.9 C)  TempSrc: Oral  SpO2: 97%    General appearance: alert,  oriented, no acute distress Eyes: PERRLA; EOMI; conjunctivae normal HENT: normocephalic; atraumatic Neck: supple with FROM Lungs: without labored respirations; speaks full sentences without difficulty; CTAB Heart: regular rate and rhythm Chest Wall: with tenderness to palpation over L upper mid-axillary line; mild break in skin with overlying erythema that is warm to touch; without bruising Abdomen: soft, non-tender; no guarding or rebound tenderness Extremities: without edema; without calf swelling or tenderness; symmetrical without gross deformities Skin: warm and dry; without rash or lesions Neuro: normal gait Psychological: alert and cooperative; normal mood and affect   Imaging: DG Ribs Unilateral W/Chest Right Result Date: 05/19/2023 CLINICAL DATA:  Pain.  Complains of bruise on under arm for 4 days. EXAM: RIGHT RIBS AND CHEST - 3+ VIEW COMPARISON:  02/14/2018 FINDINGS: Heart size and mediastinal contours are normal. No pleural fluid, interstitial edema or airspace disease. No rib fractures identified. IMPRESSION: No acute findings. Electronically Signed   By: Kimberley Penman M.D.   On: 05/19/2023 13:26     No Known Allergies  Past Medical History:  Diagnosis Date   Anemia    Anxiety 03/2019   Diabetes mellitus without complication (HCC)    Insomnia 04/07/2019   Kidney stone    Low iron stores 12/2019   Migraine    Vitamin D  deficiency 03/2019   Social History   Socioeconomic History   Marital status: Single    Spouse name: Not on file   Number of children: Not on file  Years of education: Not on file   Highest education level: Not on file  Occupational History   Not on file  Tobacco Use   Smoking status: Never   Smokeless tobacco: Never  Vaping Use   Vaping status: Never Used  Substance and Sexual Activity   Alcohol use: Yes    Comment: Occassionally.   Drug use: Never   Sexual activity: Yes    Birth control/protection: Surgical  Other Topics Concern    Not on file  Social History Narrative   Not on file   Social Drivers of Health   Financial Resource Strain: Not on file  Food Insecurity: Not on file  Transportation Needs: Not on file  Physical Activity: Not on file  Stress: Not on file  Social Connections: Not on file  Intimate Partner Violence: Not on file   Family History  Problem Relation Age of Onset   Diabetes Mother    Hypertension Mother    Healthy Father    Breast cancer Maternal Aunt    Breast cancer Maternal Grandmother    Diabetes Maternal Grandmother    Hypertension Maternal Grandmother    Cancer Maternal Grandmother    Diabetes Maternal Grandfather    Hypertension Maternal Grandfather    Breast cancer Cousin    Past Surgical History:  Procedure Laterality Date   CESAREAN SECTION     TUBAL LIGATION Left June 2012   WISDOM TOOTH EXTRACTION        Afton Albright, MD 05/21/23 1202

## 2023-06-04 ENCOUNTER — Encounter: Payer: Self-pay | Admitting: Nurse Practitioner

## 2023-06-11 ENCOUNTER — Encounter: Payer: Self-pay | Admitting: Nurse Practitioner

## 2023-06-21 ENCOUNTER — Other Ambulatory Visit: Payer: Self-pay | Admitting: Nurse Practitioner

## 2023-06-21 DIAGNOSIS — G4700 Insomnia, unspecified: Secondary | ICD-10-CM

## 2023-07-24 ENCOUNTER — Other Ambulatory Visit: Payer: Self-pay | Admitting: Nurse Practitioner

## 2023-08-14 ENCOUNTER — Ambulatory Visit
Admission: EM | Admit: 2023-08-14 | Discharge: 2023-08-14 | Disposition: A | Discharge status: Home / Self Care | Attending: Family Medicine | Admitting: Family Medicine

## 2023-08-14 DIAGNOSIS — L03111 Cellulitis of right axilla: Secondary | ICD-10-CM | POA: Diagnosis not present

## 2023-08-14 MED ORDER — KETOROLAC TROMETHAMINE 30 MG/ML IJ SOLN
30.0000 mg | Freq: Once | INTRAMUSCULAR | Status: AC
  Administered 2023-08-14: 30 mg via INTRAMUSCULAR

## 2023-08-14 MED ORDER — KETOROLAC TROMETHAMINE 10 MG PO TABS: 10.0000 mg | ORAL_TABLET | Freq: Four times a day (QID) | ORAL | 0 refills | Status: DC | PRN

## 2023-08-14 MED ORDER — AMOXICILLIN-POT CLAVULANATE 875-125 MG PO TABS: 1.0000 | ORAL_TABLET | Freq: Two times a day (BID) | ORAL | 0 refills | Status: AC

## 2023-08-14 NOTE — ED Triage Notes (Signed)
 I was moving this weekend, pushing dresser onto moving truck, I fell and my arm hit the truck, red, swollen, painful, I have tried a few things on it and nothing is helping. It is in my armpit (right arm).

## 2023-08-14 NOTE — ED Provider Notes (Signed)
 [facility]    CSN: [id number] Arrival date & time: 08/14/23  1732      History   Chief Complaint Chief Complaint  Patient presents with   Skin Problem    HPI [name] is a 43 y.o. female.   HPI Here for swelling and soreness in her right axilla.  3 days ago she was pushing something onto a moving truck and she fell in her right upper arm and under her arm edge of the moving truck platform.  She has noted bumps since then and it is swollen and nothing is helping the pain.  No fever  NKDA  She is on her menstrual cycle now.  Past surgical history significant for bilateral tubal ligation Past Medical History:  Diagnosis Date   Anemia    Anxiety 03/2019   Diabetes mellitus without complication (HCC)    Insomnia 04/07/2019   Kidney stone    Low iron stores 12/2019   Migraine    Vitamin D  deficiency 03/2019    Patient Active Problem List   Diagnosis Date Noted   Need for Tdap vaccination 12/30/2021   Vitamin D  deficiency 12/30/2021   Uncontrolled type 2 diabetes mellitus with hyperglycemia (HCC) 05/26/2021   Nonintractable headache 04/10/2019   Anxiety 04/10/2019   Insomnia 04/10/2019    Past Surgical History:  Procedure Laterality Date   CESAREAN SECTION     TUBAL LIGATION Left June 2012   WISDOM TOOTH EXTRACTION      OB History     Gravida  6   Para  3   Term  3   Preterm      AB  3   Living  3      SAB  1   IAB  1   Ectopic  1   Multiple      Live Births               Home Medications    Prior to Admission medications   Medication Sig Start Date End Date Taking? Authorizing Provider  amoxicillin -clavulanate (AUGMENTIN ) 875-125 MG tablet Take 1 tablet by mouth 2 (two) times daily for 7 days. 08/14/23 08/21/23 Yes Vonna Sharlet POUR, MD  ketorolac  (TORADOL ) 10 MG tablet Take 1 tablet (10 mg total) by mouth every 6 (six) hours as needed (pain). 08/14/23  Yes Cathren Sween, Sharlet POUR, MD  Semaglutide ,0.25 or  0.5MG /DOS, (OZEMPIC , 0.25 OR 0.5 MG/DOSE,) 2 MG/3ML SOPN INJECT 0.25 MG SUBCUTANEOUSLY ONE TIME PER WEEK Patient taking differently: Inject 0.5 mg into the skin once a week. 04/05/23  Yes Oley Bascom RAMAN, NP  Accu-Chek Softclix Lancets lancets Use as instructed to check blood sugar BID 02/10/22   Nichols, Tonya S, NP  atorvastatin  (LIPITOR) 20 MG tablet Take 1 tablet (20 mg total) by mouth daily. Patient not taking: Reported on 03/14/2023 09/29/22 09/29/23  Oley Bascom RAMAN, NP  blood glucose meter kit and supplies KIT Dispense based on patient and insurance preference. Use up to four times daily as directed. 12/30/21   Paseda, Folashade R, FNP  cetirizine  (ZYRTEC ) 10 MG tablet Take 1 tablet (10 mg total) by mouth daily. 05/25/21   Oley Bascom RAMAN, NP  escitalopram  (LEXAPRO ) 10 MG tablet Take 1 tablet (10 mg total) by mouth daily. Patient not taking: Reported on 12/30/2021 05/25/21 06/24/21  Oley Bascom RAMAN, NP  ferrous sulfate  325 (65 FE) MG EC tablet Take 1 tablet (325 mg total) by mouth daily. 08/16/20 12/30/21  Myrna Camelia HERO, NP  glucose blood (ACCU-CHEK GUIDE) test strip Use as instructed to check blood sugars BID 02/10/22   Nichols, Tonya S, NP  HYDROcodone -acetaminophen  (NORCO/VICODIN) 5-325 MG tablet Take 1 tablet by mouth every 6 (six) hours as needed for moderate pain (pain score 4-6) or severe pain (pain score 7-10). 05/19/23   Rolinda Rogue, MD  tiZANidine  (ZANAFLEX ) 4 MG tablet TAKE 1 TABLET BY MOUTH EVERY 6 HOURS AS NEEDED FOR MUSCLE SPASMS. 07/25/23   Oley Bascom RAMAN, NP  traZODone  (DESYREL ) 100 MG tablet TAKE 1 TABLET BY MOUTH EVERYDAY AT BEDTIME 06/22/23   Nichols, Tonya S, NP  Vitamin D , Ergocalciferol , (DRISDOL ) 1.25 MG (50000 UNIT) CAPS capsule Take 1 capsule (50,000 Units total) by mouth every 7 (seven) days. Patient not taking: Reported on 03/14/2023 08/16/20   Myrna Camelia HERO, NP  topiramate  (TOPAMAX ) 25 MG tablet TAKE 1 TABLET BY MOUTH TWICE A DAY 07/01/19 09/24/19  Joan Laneta HERO, FNP     Family History Family History  Problem Relation Age of Onset   Diabetes Mother    Hypertension Mother    Healthy Father    Breast cancer Maternal Aunt    Breast cancer Maternal Grandmother    Diabetes Maternal Grandmother    Hypertension Maternal Grandmother    Cancer Maternal Grandmother    Diabetes Maternal Grandfather    Hypertension Maternal Grandfather    Breast cancer Cousin     Social History Social History   Tobacco Use   Smoking status: Never    Passive exposure: Never   Smokeless tobacco: Never  Vaping Use   Vaping status: Never Used  Substance Use Topics   Alcohol use: Yes    Comment: Occassionally.   Drug use: Never     Allergies   Patient has no known allergies.   Review of Systems Review of Systems   Physical Exam Triage Vital Signs ED Triage Vitals  Encounter Vitals Group     BP 08/14/23 1745 113/75     Girls Systolic BP Percentile --      Girls Diastolic BP Percentile --      Boys Systolic BP Percentile --      Boys Diastolic BP Percentile --      Pulse Rate 08/14/23 1745 85     Resp 08/14/23 1745 18     Temp 08/14/23 1745 98.4 F (36.9 C)     Temp Source 08/14/23 1745 Oral     SpO2 08/14/23 1745 95 %     Weight 08/14/23 1743 210 lb (95.3 kg)     Height 08/14/23 1743 5' 7 (1.702 m)     Head Circumference --      Peak Flow --      Pain Score 08/14/23 1740 10     Pain Loc --      Pain Education --      Exclude from Growth Chart --    No data found.  Updated Vital Signs BP 113/75 (BP Location: Left Arm)   Pulse 85   Temp 98.4 F (36.9 C) (Oral)   Resp 18   Ht 5' 7 (1.702 m)   Wt 95.3 kg   LMP  (LMP Unknown)   SpO2 95%   BMI 32.89 kg/m   Visual Acuity Right Eye Distance:   Left Eye Distance:   Bilateral Distance:    Right Eye Near:   Left Eye Near:    Bilateral Near:     Physical Exam Vitals reviewed.  Constitutional:  General: She is not in acute distress.    Appearance: She is not ill-appearing,  toxic-appearing or diaphoretic.  Skin:    Coloration: Skin is not pale.     Comments: There is induration and tenderness in the right axilla, mainly in the lower pole.  I do not feel any fluctuance.  There are 3 separate sites that are fairly indurated, all of them about 2 cm in diameter.  Neurological:     Mental Status: She is alert and oriented to person, place, and time.  Psychiatric:        Behavior: Behavior normal.      UC Treatments / Results  Labs (all labs ordered are listed, but only abnormal results are displayed) Labs Reviewed - No data to display  EKG   Radiology No results found.  Procedures Procedures (including critical care time)  Medications Ordered in UC Medications  ketorolac  (TORADOL ) 30 MG/ML injection 30 mg (has no administration in time range)    Initial Impression / Assessment and Plan / UC Course  I have reviewed the triage vital signs and the nursing notes.  Pertinent labs & imaging results that were available during my care of the patient were reviewed by me and considered in my medical decision making (see chart for details).     This seems to be abscess formation and possible the beginnings of hidradenitis.  Augmentin  is sent in along with Toradol  tablets and Toradol  injection is given here for pain.  I have asked her to follow-up with her primary care Final Clinical Impressions(s) / UC Diagnoses   Final diagnoses:  Cellulitis of axilla, right     Discharge Instructions      Take amoxicillin -clavulanate 875 mg--1 tab twice daily with food for 7 days  You have been given a shot of Toradol  30 mg today.  Ketorolac  10 mg tablets--take 1 tablet every 6 hours as needed for pain.  This is the same medicine that is in the shot we just gave you  Please follow-up with your primary care about this issue     ED Prescriptions     Medication Sig Dispense Auth. Provider   amoxicillin -clavulanate (AUGMENTIN ) 875-125 MG tablet Take 1  tablet by mouth 2 (two) times daily for 7 days. 14 tablet Breann Losano, Sharlet POUR, MD   ketorolac  (TORADOL ) 10 MG tablet Take 1 tablet (10 mg total) by mouth every 6 (six) hours as needed (pain). 20 tablet Aaryav Hopfensperger K, MD      PDMP not reviewed this encounter.   Vonna Sharlet POUR, MD 08/14/23 [phone]

## 2023-08-14 NOTE — Discharge Instructions (Signed)
 Take amoxicillin -clavulanate 875 mg--1 tab twice daily with food for 7 days  You have been given a shot of Toradol  30 mg today.  Ketorolac  10 mg tablets--take 1 tablet every 6 hours as needed for pain.  This is the same medicine that is in the shot we just gave you  Please follow-up with your primary care about this issue

## 2023-08-17 ENCOUNTER — Other Ambulatory Visit: Payer: Self-pay | Admitting: Nurse Practitioner

## 2023-08-17 DIAGNOSIS — E1165 Type 2 diabetes mellitus with hyperglycemia: Secondary | ICD-10-CM

## 2023-08-23 ENCOUNTER — Encounter: Payer: Self-pay | Admitting: Nurse Practitioner

## 2023-08-23 ENCOUNTER — Ambulatory Visit (INDEPENDENT_AMBULATORY_CARE_PROVIDER_SITE_OTHER): Payer: Self-pay | Admitting: Nurse Practitioner

## 2023-08-23 ENCOUNTER — Other Ambulatory Visit: Payer: Self-pay | Admitting: Nurse Practitioner

## 2023-08-23 VITALS — BP 116/67 | HR 93 | Temp 98.6°F | Wt 225.2 lb

## 2023-08-23 DIAGNOSIS — R232 Flushing: Secondary | ICD-10-CM

## 2023-08-23 DIAGNOSIS — E1165 Type 2 diabetes mellitus with hyperglycemia: Secondary | ICD-10-CM | POA: Diagnosis not present

## 2023-08-23 DIAGNOSIS — Z124 Encounter for screening for malignant neoplasm of cervix: Secondary | ICD-10-CM

## 2023-08-23 DIAGNOSIS — Z113 Encounter for screening for infections with a predominantly sexual mode of transmission: Secondary | ICD-10-CM

## 2023-08-23 DIAGNOSIS — Z1231 Encounter for screening mammogram for malignant neoplasm of breast: Secondary | ICD-10-CM

## 2023-08-23 DIAGNOSIS — Z13220 Encounter for screening for lipoid disorders: Secondary | ICD-10-CM

## 2023-08-23 DIAGNOSIS — Z1329 Encounter for screening for other suspected endocrine disorder: Secondary | ICD-10-CM

## 2023-08-23 LAB — POCT GLYCOSYLATED HEMOGLOBIN (HGB A1C): Hemoglobin A1C: 8 % — AB (ref 4.0–5.6)

## 2023-08-23 MED ORDER — SEMAGLUTIDE (1 MG/DOSE) 4 MG/3ML ~~LOC~~ SOPN: 1.0000 mg | PEN_INJECTOR | SUBCUTANEOUS | 2 refills | Status: DC

## 2023-08-23 NOTE — Progress Notes (Signed)
 Subjective   Patient ID: [name], female    DOB: January 12, 1981, 43 y.o.   MRN: [id number]  Chief Complaint  Patient presents with   Medical Management of Chronic Issues    Referring provider: Oley Bascom RAMAN, NP  Loretto SHAUNNA Province is a 43 y.o. female with Past Medical History: No date: Anemia 03/2019: Anxiety No date: Diabetes mellitus without complication (HCC) 04/07/2019: Insomnia No date: Kidney stone 12/2019: Low iron stores No date: Migraine 03/2019: Vitamin D  deficiency   HPI  [name] is a 43 y.o. female with past medical history of uncontrolled type 2 diabetes, obesity, anxiety, insomnia presents for physical and follow-up chronic medical conditions.    Type 2 diabetes.  Currently not taking diabetic medications.    A1C is 8.0.  Does not follow diabetic diet and does not exercise.  She is interested in losing weight.   Will increase ozempic  to 1 mg weekly.  Will place a referral for pharmacy for medication management.  Denies f/c/s, n/v/d, hemoptysis, PND, leg swelling. Denies chest pain or edema.  Patient does need Pap smear today and also needs mammogram ordered today.  No Known Allergies  Immunization History  Administered Date(s) Administered   PFIZER(Purple Top)SARS-COV-2 Vaccination 11/26/2019   Tdap 12/30/2021    Tobacco History: Social History   Tobacco Use  Smoking Status Never   Passive exposure: Never  Smokeless Tobacco Never   Counseling given: Not Answered   Outpatient Encounter Medications as of 08/23/2023  Medication Sig   Accu-Chek Softclix Lancets lancets Use as instructed to check blood sugar BID   blood glucose meter kit and supplies KIT Dispense based on patient and insurance preference. Use up to four times daily as directed.   cetirizine  (ZYRTEC ) 10 MG tablet Take 1 tablet (10 mg total) by mouth daily.   glucose blood (ACCU-CHEK GUIDE) test strip Use as instructed to check blood sugars BID   HYDROcodone -acetaminophen   (NORCO/VICODIN) 5-325 MG tablet Take 1 tablet by mouth every 6 (six) hours as needed for moderate pain (pain score 4-6) or severe pain (pain score 7-10).   ketorolac  (TORADOL ) 10 MG tablet Take 1 tablet (10 mg total) by mouth every 6 (six) hours as needed (pain).   Semaglutide , 1 MG/DOSE, 4 MG/3ML SOPN Inject 1 mg as directed once a week.   tiZANidine  (ZANAFLEX ) 4 MG tablet TAKE 1 TABLET BY MOUTH EVERY 6 HOURS AS NEEDED FOR MUSCLE SPASMS.   [DISCONTINUED] Semaglutide ,0.25 or 0.5MG /DOS, (OZEMPIC , 0.25 OR 0.5 MG/DOSE,) 2 MG/3ML SOPN INJECT 0.25 MG SUBCUTANEOUSLY ONE TIME PER WEEK   atorvastatin  (LIPITOR) 20 MG tablet Take 1 tablet (20 mg total) by mouth daily. (Patient not taking: Reported on 03/14/2023)   escitalopram  (LEXAPRO ) 10 MG tablet Take 1 tablet (10 mg total) by mouth daily. (Patient not taking: Reported on 12/30/2021)   ferrous sulfate  325 (65 FE) MG EC tablet Take 1 tablet (325 mg total) by mouth daily.   traZODone  (DESYREL ) 100 MG tablet TAKE 1 TABLET BY MOUTH EVERYDAY AT BEDTIME   Vitamin D , Ergocalciferol , (DRISDOL ) 1.25 MG (50000 UNIT) CAPS capsule Take 1 capsule (50,000 Units total) by mouth every 7 (seven) days. (Patient not taking: Reported on 03/14/2023)   [DISCONTINUED] topiramate  (TOPAMAX ) 25 MG tablet TAKE 1 TABLET BY MOUTH TWICE A DAY   No facility-administered encounter medications on file as of 08/23/2023.    Review of Systems  Review of Systems  Constitutional: Negative.   HENT: Negative.    Cardiovascular: Negative.  Gastrointestinal: Negative.   Allergic/Immunologic: Negative.   Neurological: Negative.   Psychiatric/Behavioral: Negative.       Objective:   BP 116/67   Pulse 93   Temp 98.6 F (37 C) (Oral)   Wt 225 lb 3.2 oz (102.2 kg)   LMP  (LMP Unknown)   SpO2 98%   BMI 35.27 kg/m   Wt Readings from Last 5 Encounters:  08/23/23 225 lb 3.2 oz (102.2 kg)  08/14/23 210 lb (95.3 kg)  12/05/22 228 lb (103.4 kg)  09/28/22 222 lb (100.7 kg)  06/03/22 219  lb (99.3 kg)     Physical Exam Vitals and nursing note reviewed.  Constitutional:      General: She is not in acute distress.    Appearance: She is well-developed.  Cardiovascular:     Rate and Rhythm: Normal rate and regular rhythm.  Pulmonary:     Effort: Pulmonary effort is normal.     Breath sounds: Normal breath sounds.  Neurological:     Mental Status: She is alert and oriented to person, place, and time.       Assessment & Plan:   Hot flashes -     FSH/LH  Uncontrolled type 2 diabetes mellitus with hyperglycemia (HCC) -     POCT glycosylated hemoglobin (Hb A1C) -     Microalbumin / creatinine urine ratio -     CBC -     Comprehensive metabolic panel with GFR -     AMB Referral VBCI Care Management -     Semaglutide  (1 MG/DOSE); Inject 1 mg as directed once a week.  Dispense: 3 mL; Refill: 2  Encounter for screening mammogram for malignant neoplasm of breast -     3D Screening Mammogram, Left and Right; Future  Screen for STD (sexually transmitted disease) -     Cytology - PAP -     RPR+HIV+GC+CT Panel  Cervical cancer screening -     Cytology - PAP  Lipid screening -     Lipid panel  Thyroid disorder screen -     TSH     Return in about 3 months (around 11/23/2023).   Bascom GORMAN Borer, NP 08/23/2023

## 2023-08-24 LAB — COMPREHENSIVE METABOLIC PANEL WITH GFR
ALT: 10 IU/L (ref 0–32)
AST: 14 IU/L (ref 0–40)
Albumin: 4.3 g/dL (ref 3.9–4.9)
Alkaline Phosphatase: 67 IU/L (ref 44–121)
BUN/Creatinine Ratio: 9 (ref 9–23)
BUN: 7 mg/dL (ref 6–24)
Bilirubin Total: 0.4 mg/dL (ref 0.0–1.2)
CO2: 17 mmol/L — ABNORMAL LOW (ref 20–29)
Calcium: 9.7 mg/dL (ref 8.7–10.2)
Chloride: 103 mmol/L (ref 96–106)
Creatinine, Ser: 0.79 mg/dL (ref 0.57–1.00)
Globulin, Total: 3.3 g/dL (ref 1.5–4.5)
Glucose: 139 mg/dL — ABNORMAL HIGH (ref 70–99)
Potassium: 4.3 mmol/L (ref 3.5–5.2)
Sodium: 137 mmol/L (ref 134–144)
Total Protein: 7.6 g/dL (ref 6.0–8.5)
eGFR: 95 mL/min/1.73 (ref >59)

## 2023-08-24 LAB — CBC
Hematocrit: 42.9 % (ref 34.0–46.6)
Hemoglobin: 12.3 g/dL (ref 11.1–15.9)
MCH: 21.2 pg — ABNORMAL LOW (ref 26.6–33.0)
MCHC: 28.7 g/dL — ABNORMAL LOW (ref 31.5–35.7)
MCV: 74 fL — ABNORMAL LOW (ref 79–97)
Platelets: 429 x10E3/uL (ref 150–450)
RBC: 5.81 x10E6/uL — ABNORMAL HIGH (ref 3.77–5.28)
RDW: 15.1 % (ref 11.7–15.4)
WBC: 9.6 x10E3/uL (ref 3.4–10.8)

## 2023-08-24 LAB — MICROALBUMIN / CREATININE URINE RATIO
Creatinine, Urine: 277.7 mg/dL
Microalb/Creat Ratio: 11 mg/g{creat} (ref 0–29)
Microalbumin, Urine: 29.4 ug/mL

## 2023-08-24 LAB — LIPID PANEL
Chol/HDL Ratio: 5.2 ratio — ABNORMAL HIGH (ref 0.0–4.4)
Cholesterol, Total: 196 mg/dL (ref 100–199)
HDL: 38 mg/dL — ABNORMAL LOW (ref >39)
LDL Chol Calc (NIH): 119 mg/dL — ABNORMAL HIGH (ref 0–99)
Triglycerides: 223 mg/dL — ABNORMAL HIGH (ref 0–149)
VLDL Cholesterol Cal: 39 mg/dL (ref 5–40)

## 2023-08-24 LAB — FSH/LH
FSH: 4.4 m[IU]/mL
LH: 8.6 m[IU]/mL

## 2023-08-24 LAB — TSH: TSH: 1.07 u[IU]/mL (ref 0.450–4.500)

## 2023-08-26 ENCOUNTER — Ambulatory Visit: Payer: Self-pay | Admitting: Nurse Practitioner

## 2023-08-26 LAB — RPR+HIV+GC+CT PANEL
Chlamydia trachomatis, NAA: NEGATIVE
HIV Screen 4th Generation wRfx: NONREACTIVE
Neisseria Gonorrhoeae by PCR: NEGATIVE
RPR Ser Ql: NONREACTIVE

## 2023-08-27 ENCOUNTER — Telehealth: Payer: Self-pay

## 2023-08-27 NOTE — Progress Notes (Signed)
 Care Guide Pharmacy Note  08/27/2023 Name: MAISEN SCHMIT MRN: [id number] DOB: 1980/10/23  Referred By: Oley Bascom RAMAN, NP Reason for referral: Complex Care Management (Outreach to schedule with Pharm d )   FELIZA DIVEN is a 43 y.o. year old female who is a primary care patient of Oley Bascom RAMAN, NP.  Loretto P Hojnacki was referred to the pharmacist for assistance related to: DMII  Successful contact was made with the patient to discuss pharmacy services including being ready for the pharmacist to call at least 5 minutes before the scheduled appointment time and to have medication bottles and any blood pressure readings ready for review. The patient agreed to meet with the pharmacist via telephone visit on (date/time).09/14/2023  Jeoffrey Buffalo , RMA     [place]  [facility], [facility] Guide  Direct Dial: [phone]  Website: [email or web address]

## 2023-09-10 ENCOUNTER — Ambulatory Visit: Payer: Self-pay | Admitting: *Deleted

## 2023-09-10 NOTE — Telephone Encounter (Signed)
 FYI Only or Action Required?: Action required by provider: clinical question for provider and update on patient condition.  Patient was last seen in primary care on 08/23/2023 by Oley Bascom RAMAN, NP.  Called Nurse Triage reporting Vaginal Bleeding.  Symptoms began several days ago.  Interventions attempted: Prescription medications: ibuprofen  800 mg  and Rest, hydration, or home remedies.  Symptoms are: gradually worsening.  Triage Disposition: See PCP When Office is Open (Within 3 Days)  Patient/caregiver understands and will follow disposition?: Yes              Copied from CRM #[id number]. Topic: Clinical - Red Word Triage >> Sep 10, 2023  9:55 AM Selinda RAMAN wrote: Red Word that prompted transfer to Nurse Triage: The patient called in stating she has been having extremely intense periods lately. This last one started last Thursday and she has very heavy bleeding and abdominal pain. She also says she is extremely lethargic and feels like she can't move. I will transfer her to E2C2 NT Reason for Disposition  [1] Heavier than normal periods (e.g., doubling up on pads to prevent leaking, needing to change pads overnight, unable to do normal activities) AND [2] last > 7 days  Answer Assessment - Initial Assessment Questions Appt scheduled tomorrow. Requesting hysterectomy due to sx consistently occurring. Recommended if sx worsen go to ED.       1. BLEEDING SEVERITY: Describe the bleeding that you are having. How much bleeding is there?      Heavy bleeding  2. ONSET: When did the bleeding begin? Is it continuing now?     Thursday and continuing now  3. MENSTRUAL PERIOD: When was the last normal menstrual period? How is this different than your period?     More than normal bleeding  4. REGULARITY: How regular are your periods?     Started earlier than normal  5. ABDOMEN PAIN: Do you have any pain? How bad is the pain?  (e.g., Scale 0-10; none, mild,  moderate, or severe)     Mild now severe 1st 3 days of period. 6. PREGNANCY: Is there any chance you are pregnant? When was your last menstrual period?     Na  7. BREASTFEEDING: Are you breastfeeding?     na 8. HORMONE MEDICINES: Are you taking any hormone medicines, prescription or over-the-counter? (e.g., birth control pills, estrogen)     Na  9. BLOOD THINNER MEDICINES: Do you take any blood thinners? (e.g., Coumadin / warfarin, Pradaxa / dabigatran, aspirin)     na 10. CAUSE: What do you think is causing the bleeding? (e.g., recent gyn surgery, recent gyn procedure; known bleeding disorder, cervical cancer, polycystic ovarian disease, fibroids)         Hx fibroids with family  50. HEMODYNAMIC STATUS: Are you weak or feeling lightheaded? If Yes, ask: Can you stand and walk normally?         Feeling weak but can stand and walk  12. OTHER SYMPTOMS: What other symptoms are you having with the bleeding? (e.g., passed tissue, vaginal discharge, fever, menstrual-type cramps)       Grape and cherry size blood clots, bleeding so much needs to wear diapers. Abdominal pain mild now but comes and goes. Requesting hysterectomy due to significant bleeding with periods .  Protocols used: Vaginal Bleeding - Abnormal-A-AH

## 2023-09-11 ENCOUNTER — Ambulatory Visit: Payer: Self-pay | Admitting: Nurse Practitioner

## 2023-09-11 ENCOUNTER — Ambulatory Visit: Payer: Self-pay

## 2023-09-11 NOTE — Telephone Encounter (Signed)
   FYI Only or Action Required?: FYI only for provider.  Patient was last seen in primary care on 08/23/2023 by Oley Bascom RAMAN, NP.  Called Nurse Triage reporting Vaginal Bleeding and Advice Only.  Symptoms began information only.  Interventions attempted: Other: information only .  Symptoms are: information only .  Triage Disposition: Information or Advice Only Call  Patient/caregiver understands and will follow disposition?: Yes                             Copied from CRM [id number]. Topic: Clinical - Red Word Triage >> Sep 11, 2023 11:22 AM Turkey B wrote: [facility] that prompted transfer to Nurse Triage: pt spoke with NT earlier and has appt at 1 todaay,  but bleeding from vaginal is getting worse Pt says she is going to the ER Reason for Disposition  Health information question, no triage required and triager able to answer question  Answer Assessment - Initial Assessment Questions 1. REASON FOR CALL: What is the main reason for your call? or How can I best help you? Patient was triaged earlier this morning for abnormal vaginal bleeding and was scheduled in office for appointment today. Patient is calling back to cancel appointment. Patient stated she is going to ED for symptoms at this time. Appointment cancelled by this RN. This RN advised patient to contact office after ED visit to arrange a HFU. Patient verbalized understanding.  Protocols used: Information Only Call - No Triage-A-AH

## 2023-09-14 ENCOUNTER — Other Ambulatory Visit: Payer: Self-pay

## 2023-09-14 DIAGNOSIS — E1165 Type 2 diabetes mellitus with hyperglycemia: Secondary | ICD-10-CM

## 2023-09-14 MED ORDER — ACCU-CHEK GUIDE TEST VI STRP: ORAL_STRIP | 12 refills | Status: AC

## 2023-09-14 MED ORDER — ACCU-CHEK SOFTCLIX LANCETS MISC
11 refills | Status: AC
Start: 2023-09-14 — End: ?

## 2023-09-14 MED ORDER — ACCU-CHEK GUIDE ME W/DEVICE KIT: PACK | 0 refills | Status: AC

## 2023-09-14 MED ORDER — ROSUVASTATIN CALCIUM 5 MG PO TABS: 5.0000 mg | ORAL_TABLET | Freq: Every day | ORAL | 3 refills | Status: AC

## 2023-09-14 NOTE — Progress Notes (Signed)
 09/14/2023 Name: [name] MRN: [id number] DOB: Dec 15, 1980  Chief Complaint  Patient presents with   Diabetes    [name] is a 43 y.o. year old female who presented for a telephone visit.   They were referred to the pharmacist by their PCP for assistance in managing diabetes. PMH includes T2DM.   Subjective: Patient was last seen by PCP, Bascom Borer, NP, on 08/23/23. At last visit, BP was well controlled. A1C imprvoed from 10.7% to 8.0% on Ozempic  0.5 mg weekly. She was instructed to increase to Ozempic  1 mg weekly for further glycemic control and weight loss.   Today, patient reports doing well. She states she just finished her current supply of Ozempic  0.5 mg weekly and plans to pick up the Ozempic  1 mg pens today.    Care Team: Primary Care Provider: Borer Bascom RAMAN, NP ; Next Scheduled Visit: 09/20/23   Medication Access/Adherence  Current Pharmacy:  [id number] - [place], [place] - [address] AT [facility] [address] [place] [address] Phone: [phone] Fax: [phone]   Patient reports affordability concerns with their medications: No  Patient reports access/transportation concerns to their pharmacy: No  Patient reports adherence concerns with their medications:  Yes   - stopped taking atorvastatin  due to AE of HA and nausea   Diabetes:  Current medications: Ozempic  1 mg weekly (reports that she finished her last 0.5 mg dose last Wednesday, plans to pick up 1 mg pens today)  Denies abdominal pain, nausea, vomiting with Ozempic . Endorses a little bit of appetite suppresion. Reports that she has had worsening cramping and pain with her menstrual cycle and is hoping to look into the possibility of having a hysterectomy.  Current glucose readings: reports that she needs a new glucometer - lost her Accu Chek Meter.  Patient denies hypoglycemic s/sx including dizziness, shakiness, sweating. Patient  denies hyperglycemic symptoms including polyuria, polydipsia, polyphagia, nocturia, neuropathy. Reports stable blurred vision.  Current meal patterns: Reports that she has been cutting back on sweets. Mainly gets cravings on her menstrual period. Tries to eat 3 meals per day and one snack. - Breakfast: eggs, [place] bacon, fruit - Vegetables: squash, zucchini, okra - has been trying to fill up on these - Drinks: Stays hydrated with water throughout the day, drinks grapefruit juice, apple juice but has tried to cut back. Dilutes juice with water. Occasional soda.  Current physical activity: Has increased exercise recently  Current medication access support: commercial insurance  Hyperlipidemia/ASCVD Risk Reduction  Current lipid lowering medications: atorvastatin  20 mg daily (not taking frequently - reports side effects including nausea/HA) Medications tried in the past: none  Clinical ASCVD: No  The 10-year ASCVD risk score (Arnett DK, et al., 2019) is: 2.9%   Values used to calculate the score:     Age: 11 years     Clincally relevant sex: Female     Is Non-Hispanic African American: Yes     Diabetic: Yes     Tobacco smoker: No     Systolic Blood Pressure: 116 mmHg     Is BP treated: No     HDL Cholesterol: 38 mg/dL     Total Cholesterol: 196 mg/dL    Objective:  BP Readings from Last 3 Encounters:  08/23/23 116/67  08/14/23 113/75  05/19/23 126/84    Lab Results  Component Value Date   HGBA1C 8.0 (A) 08/23/2023   HGBA1C 10.7 (A) 09/28/2022   HGBA1C  10.6 (A) 12/30/2021       Latest Ref Rng & Units 08/23/2023    3:45 PM 09/28/2022    3:14 PM 05/25/2021    4:18 PM  BMP  Glucose 70 - 99 mg/dL 860  802  870   BUN 6 - 24 mg/dL 7  6  7    Creatinine 0.57 - 1.00 mg/dL 9.20  9.24  9.23   BUN/Creat Ratio 9 - 23 9  8  9    Sodium 134 - 144 mmol/L 137  139  139   Potassium 3.5 - 5.2 mmol/L 4.3  4.0  4.8   Chloride 96 - 106 mmol/L 103  102  102   CO2 20 - 29 mmol/L 17  23  25     Calcium  8.7 - 10.2 mg/dL 9.7  8.9  9.7     Lab Results  Component Value Date   CHOL 196 08/23/2023   HDL 38 (L) 08/23/2023   LDLCALC 119 (H) 08/23/2023   TRIG 223 (H) 08/23/2023   CHOLHDL 5.2 (H) 08/23/2023    Medications Reviewed Today     Reviewed by Brinda Lorain SQUIBB, RPH (Pharmacist) on 09/14/23 at 1323  Med List Status: <None>   Medication Order Taking? Sig Documenting Provider Last Dose Status Informant  Accu-Chek Softclix Lancets lancets [id number]  Use as instructed to check blood sugar BID Nichols, Tonya S, NP  Active    Patient not taking:   Discontinued 09/14/23 1025 (Change in therapy)            Med Note>> Brinda Lorain SQUIBB, [facility]   09/14/2023 10:25 AM side effects    blood glucose meter kit and supplies KIT [id number]  Dispense based on patient and insurance preference. Use up to four times daily as directed. Paseda, Folashade R, FNP  Active   cetirizine  (ZYRTEC ) 10 MG tablet [id number]  Take 1 tablet (10 mg total) by mouth daily. Oley Bascom RAMAN, NP  Active    Patient not taking:   Discontinued 09/14/23 1019 (Expired Prescription)   ferrous sulfate  325 (65 FE) MG EC tablet [id number]  Take 1 tablet (325 mg total) by mouth daily. Myrna Salines M, NP  Expired 12/30/21 2359   glucose blood (ACCU-CHEK GUIDE) test strip [id number]  Use as instructed to check blood sugars BID Oley Bascom RAMAN, NP  Active     Discontinued 09/14/23 1020 (Completed Course)     Discontinued 09/14/23 1021 (Completed Course)   rosuvastatin  (CRESTOR ) 5 MG tablet [id number] Yes Take 1 tablet (5 mg total) by mouth daily. Oley Bascom RAMAN, NP  Active   Semaglutide , 1 MG/DOSE, 4 MG/3ML SOPN [id number] Yes Inject 1 mg as directed once a week. Oley Bascom RAMAN, NP  Active   tiZANidine  (ZANAFLEX ) 4 MG tablet [id number] Yes TAKE 1 TABLET BY MOUTH EVERY 6 HOURS AS NEEDED FOR MUSCLE SPASMS. Oley Bascom RAMAN, NP  Active     Discontinued 09/24/19 1131   traZODone  (DESYREL ) 100 MG tablet [id number] Yes TAKE 1 TABLET BY MOUTH  EVERYDAY AT BEDTIME Nichols, Tonya S, NP  Active    Patient not taking:   Discontinued 09/14/23 1021 (Expired Prescription)   Med List Note Michaeleen Cathlyn LABOR, [place] 02/01/12 1646): Verified with RX, she has not filled since 05/2011.               Assessment/Plan:   Diabetes: - Currently uncontrolled with most recent A1C of 8.0% above goal <7%, but much improved from 10.7% previously. Medication adherence appears appropriate. Patient  has been tolerating Ozempic  0.5 mg well. Agree with plan to increase to 1 mg weekly. Counseled on importance of diet and lifestyle in controlling BG in addition to medication.  - Last UACR July 2025: 11 mg/g - Reviewed long term cardiovascular and renal outcomes of uncontrolled blood sugar - Reviewed goal A1c, goal fasting, and goal 2 hour post prandial glucose - Reviewed dietary modifications including  utilizing the healthy plate method, limiting portion size of carbohydrate foods, increasing intake of protein and non-starchy vegetables. Counseled patient to stay hydrated with water throughout the day. - Reviewed lifestyle modifications including: aiming for 150 minutes of moderate intensity exercise every week.  - Recommend to start Ozempic  1 mg once weekly. Counseled on potential AE with dose increase  - Will send Rx for new BG monitoring supplies - Next A1C due Oct 2025     Hyperlipidemia/ASCVD Risk Reduction: - Currently uncontrolled with most recent LDL-C of 119 mg/dL above goal < 70 mg/dL given U7IF + comorbidities. Moderate intensity statin indicated. Patient is not taking atorvastatin  due to tolerability issues. She is willing to trial low-dose rosuvastatin . - Reviewed long term complications of uncontrolled cholesterol - Recommend to stop atorvastatin  and START rosuvastatin  5 mg daily    Patient verbalized understanding of treatment plan.    Follow Up Plan:  Pharmacist 10/26/23 PCP clinic visit 09/20/23, 11/23/23   Lorain Baseman, PharmD [facility] [phone]

## 2023-09-17 ENCOUNTER — Other Ambulatory Visit: Payer: Self-pay | Admitting: Nurse Practitioner

## 2023-09-17 DIAGNOSIS — G4700 Insomnia, unspecified: Secondary | ICD-10-CM

## 2023-09-17 NOTE — Telephone Encounter (Signed)
 Please advise [facility]

## 2023-09-20 ENCOUNTER — Ambulatory Visit: Payer: Self-pay | Admitting: Nurse Practitioner

## 2023-10-03 LAB — CYTOLOGY - PAP
Adequacy: ABSENT
Chlamydia: NEGATIVE
Comment: NEGATIVE
Comment: NEGATIVE
Comment: NEGATIVE
Comment: NEGATIVE
Comment: NORMAL
Diagnosis: NEGATIVE
HSV1: NEGATIVE
HSV2: NEGATIVE
High risk HPV: NEGATIVE
Neisseria Gonorrhea: NEGATIVE
Trichomonas: NEGATIVE

## 2023-10-04 ENCOUNTER — Ambulatory Visit: Payer: Self-pay | Admitting: Nurse Practitioner

## 2023-10-22 ENCOUNTER — Ambulatory Visit: Payer: Self-pay | Admitting: Nurse Practitioner

## 2023-10-26 ENCOUNTER — Other Ambulatory Visit: Payer: Self-pay

## 2023-10-26 DIAGNOSIS — E1165 Type 2 diabetes mellitus with hyperglycemia: Secondary | ICD-10-CM

## 2023-10-26 NOTE — Progress Notes (Signed)
 10/26/2023 Name: [name] MRN: [id number] DOB: 02-19-1980  Chief Complaint  Patient presents with   Diabetes    [name] is a 43 y.o. year old female who presented for a telephone visit.   They were referred to the pharmacist by their PCP for assistance in managing diabetes. PMH includes T2DM.   Subjective: Patient was last seen by PCP, Bascom Borer, NP, on 08/23/23. At last visit, BP was well controlled. A1C imprvoed from 10.7% to 8.0% on Ozempic  0.5 mg weekly. She was instructed to increase to Ozempic  1 mg weekly for further glycemic control and weight loss. She was engaged by pharmacy via telephone on 09/14/23 and reported she had just finished Ozempic  0.5 mg weekly and planned to increase to 1 mg as prescribed. She was also instructed to start rosuvastatin  5 mg daily for HLD, as she was previously intolerant to atorvastatin .  Today, patient reports doing well. She is tolerating Ozempic  1 mg weekly well and is also taking rosuvastatin  5 mg daily without AE.   Care Team: Primary Care Provider: Borer Bascom RAMAN, NP ; Next Scheduled Visit: 11/08/23 and 11/23/23 - reports she wants to keep both of these appointments.   Medication Access/Adherence  Current Pharmacy:  [id number] - [place], [place] - [address] AT [facility] [address] [place] [address] Phone: [phone] Fax: [phone]   Patient reports affordability concerns with their medications: No  Patient reports access/transportation concerns to their pharmacy: No  Patient reports adherence concerns with their medications:  No      Diabetes:  Current medications: Ozempic  1 mg weekly (reports that she finished 1 full pen of Ozempic  1 mg dose)  Denies abdominal pain, nausea, vomiting with Ozempic . Endorses a little bit of appetite suppression, but still eating regularly throughout the day.  Current glucose readings: Got a Reli-On meter from pharmacy  (was not able to fill Accu chek - too soon on insurance) - last BG she can recall is 109 mg/dL fasting.   Patient denies hypoglycemic s/sx including dizziness, shakiness, sweating. Patient denies hyperglycemic symptoms including polyuria, polydipsia, polyphagia, nocturia, neuropathy. Reports stable blurred vision.  Current meal patterns: Reports that she has been cutting back on sweets. Mainly gets cravings on her menstrual period. Tries to eat 3 meals per day and one snack. - Breakfast: eggs, [place] bacon, fruit - Vegetables: squash, zucchini, okra - has been trying to fill up on these - Drinks: Stays hydrated with water throughout the day, drinks grapefruit juice, apple juice but has tried to cut back. Dilutes juice with water. Occasional soda. Also has gatorade (zero sugar, and regular)  Current physical activity: Has increased exercise recently  Current medication access support: commercial insurance  Hyperlipidemia/ASCVD Risk Reduction  Current lipid lowering medications: rosuvastatin  5 mg daily (reports she is taking and tolerating well) Medications tried in the past: atorvastatin  20 mg daily (HA, nausea)  Clinical ASCVD: No  The 10-year ASCVD risk score (Arnett DK, et al., 2019) is: 2.9%   Values used to calculate the score:     Age: 89 years     Clincally relevant sex: Female     Is Non-Hispanic African American: Yes     Diabetic: Yes     Tobacco smoker: No     Systolic Blood Pressure: 116 mmHg     Is BP treated: No     HDL Cholesterol: 38 mg/dL     Total Cholesterol: 196 mg/dL  Objective:  BP Readings from Last 3 Encounters:  08/23/23 116/67  08/14/23 113/75  05/19/23 126/84    Lab Results  Component Value Date   HGBA1C 8.0 (A) 08/23/2023   HGBA1C 10.7 (A) 09/28/2022   HGBA1C 10.6 (A) 12/30/2021       Latest Ref Rng & Units 08/23/2023    3:45 PM 09/28/2022    3:14 PM 05/25/2021    4:18 PM  BMP  Glucose 70 - 99 mg/dL 860  802  870   BUN 6 - 24 mg/dL 7  6   7    Creatinine 0.57 - 1.00 mg/dL 9.20  9.24  9.23   BUN/Creat Ratio 9 - 23 9  8  9    Sodium 134 - 144 mmol/L 137  139  139   Potassium 3.5 - 5.2 mmol/L 4.3  4.0  4.8   Chloride 96 - 106 mmol/L 103  102  102   CO2 20 - 29 mmol/L 17  23  25    Calcium  8.7 - 10.2 mg/dL 9.7  8.9  9.7     Lab Results  Component Value Date   CHOL 196 08/23/2023   HDL 38 (L) 08/23/2023   LDLCALC 119 (H) 08/23/2023   TRIG 223 (H) 08/23/2023   CHOLHDL 5.2 (H) 08/23/2023    Medications Reviewed Today     Reviewed by Brinda Lorain SQUIBB, RPH (Pharmacist) on 10/26/23 at 1539  Med List Status: <None>   Medication Order Taking? Sig Documenting Provider Last Dose Status Informant  Accu-Chek Softclix Lancets lancets [id number]  Use as instructed to check blood sugar up to twice daily Nichols, Tonya S, NP  Active   Blood Glucose Monitoring Suppl (ACCU-CHEK GUIDE ME) w/Device KIT [id number]  Use as directed to monitor blood sugar Oley Bascom RAMAN, NP  Active   cetirizine  (ZYRTEC ) 10 MG tablet [id number]  Take 1 tablet (10 mg total) by mouth daily. Oley Bascom RAMAN, NP  Active   ferrous sulfate  325 (65 FE) MG EC tablet [id number]  Take 1 tablet (325 mg total) by mouth daily. Myrna Salines M, NP  Expired 12/30/21 2359   glucose blood (ACCU-CHEK GUIDE TEST) test strip [id number]  Use as instructed to monitor blood sugar up to twice daily Nichols, Tonya S, NP  Active   rosuvastatin  (CRESTOR ) 5 MG tablet [id number] Yes Take 1 tablet (5 mg total) by mouth daily. Oley Bascom RAMAN, NP  Active   Semaglutide , 1 MG/DOSE, 4 MG/3ML SOPN [id number] Yes Inject 1 mg as directed once a week. Oley Bascom RAMAN, NP  Active   tiZANidine  (ZANAFLEX ) 4 MG tablet [id number]  TAKE 1 TABLET BY MOUTH EVERY 6 HOURS AS NEEDED FOR MUSCLE SPASMS. Oley Bascom RAMAN, NP  Active     Discontinued 09/24/19 1131   traZODone  (DESYREL ) 100 MG tablet [id number]  TAKE 1 TABLET BY MOUTH EVERYDAY AT BEDTIME Nichols, Tonya S, NP  Active   Med List Note Michaeleen Cathlyn LABOR, [place]  02/01/12 1646): Verified with RX, she has not filled since 05/2011.               Assessment/Plan:   Diabetes: - Currently uncontrolled with most recent A1C of 8.0% above goal <7%, but SMBG demonstrates improvement since increasing to Ozempic  1 mg weekly. Medication adherence appears appropriate. Counseled on importance of diet and lifestyle in controlling BG in addition to medication, as patient desires additional weight loss. Discussed that after a few months, we can consider increasing to 2 mg weekly if she would  like. - Last UACR July 2025: 11 mg/g - Reviewed long term cardiovascular and renal outcomes of uncontrolled blood sugar - Reviewed goal A1c, goal fasting, and goal 2 hour post prandial glucose - Reviewed dietary modifications including  utilizing the healthy plate method, limiting portion size of carbohydrate foods, increasing intake of protein and non-starchy vegetables. Counseled patient to stay hydrated with water throughout the day. - Reviewed lifestyle modifications including: aiming for 150 minutes of moderate intensity exercise every week.  - Recommend to continue Ozempic  1 mg once weekly.  - Advised to check FBG and 2 hr PPG a couple times per week - Next A1C due Oct 2025     Hyperlipidemia/ASCVD Risk Reduction: - Currently uncontrolled with most recent LDL-C of 119 mg/dL above goal < 70 mg/dL given U7IF + comorbidities. Moderate intensity statin indicated. Appropriate to continue current regimen, given improved tolerability with rosuvastatin . - Reviewed long term complications of uncontrolled cholesterol - Recommend to continue rosuvastatin  5 mg daily    Patient verbalized understanding of treatment plan.    Follow Up Plan:  Pharmacist 12/07/23 PCP clinic visit 11/23/23 (A1C)   Lorain Baseman, PharmD [facility] [phone]

## 2023-10-29 ENCOUNTER — Ambulatory Visit: Payer: Self-pay | Admitting: Nurse Practitioner

## 2023-11-08 ENCOUNTER — Ambulatory Visit: Payer: Self-pay | Admitting: Nurse Practitioner

## 2023-11-21 ENCOUNTER — Other Ambulatory Visit: Payer: Self-pay | Admitting: Nurse Practitioner

## 2023-11-21 NOTE — Telephone Encounter (Signed)
 Please advise [facility]

## 2023-11-22 ENCOUNTER — Ambulatory Visit: Payer: Self-pay | Admitting: Nurse Practitioner

## 2023-11-22 NOTE — Telephone Encounter (Unsigned)
 Copied from CRM #[id number]. Topic: Clinical - Medication Refill >> Nov 22, 2023 11:00 AM Lonell PEDLAR wrote: Medication: Ibuprofen    Has the patient contacted their pharmacy? Yes, advised pt to call office  This is the patient's preferred pharmacy:  [id number] [place], [place] - [address] AT [facility] [address] [place] [address] Phone: [phone] Fax: [phone]  Is this the correct pharmacy for this prescription? Yes If no, delete pharmacy and type the correct one.   Has the prescription been filled recently? Yes  Is the patient out of the medication? Yes  Has the patient been seen for an appointment in the last year OR does the patient have an upcoming appointment? Yes  Can we respond through MyChart? No  Agent: Please be advised that Rx refills may take up to 3 business days. We ask that you follow-up with your pharmacy.

## 2023-11-23 ENCOUNTER — Ambulatory Visit: Payer: Self-pay | Admitting: Nurse Practitioner

## 2023-12-07 ENCOUNTER — Other Ambulatory Visit: Payer: Self-pay

## 2023-12-07 DIAGNOSIS — E1165 Type 2 diabetes mellitus with hyperglycemia: Secondary | ICD-10-CM

## 2023-12-07 MED ORDER — OZEMPIC (2 MG/DOSE) 8 MG/3ML ~~LOC~~ SOPN: 2.0000 mg | PEN_INJECTOR | SUBCUTANEOUS | 5 refills | Status: AC

## 2023-12-07 NOTE — Progress Notes (Signed)
 12/07/2023 Name: [name] MRN: [id number] DOB: Jul 21, 1980  Chief Complaint  Patient presents with   Diabetes    [name] is a 43 y.o. year old female who presented for a telephone visit.   They were referred to the pharmacist by their PCP for assistance in managing diabetes. PMH includes T2DM.   Subjective: Patient was last seen by PCP, Bascom Borer, NP, on 08/23/23. At last visit, BP was well controlled. A1C imprvoed from 10.7% to 8.0% on Ozempic  0.5 mg weekly. She was instructed to increase to Ozempic  1 mg weekly for further glycemic control and weight loss. She was engaged by pharmacy via telephone on 09/14/23 and reported she had just finished Ozempic  0.5 mg weekly and planned to increase to 1 mg as prescribed. She was also instructed to start rosuvastatin  5 mg daily for HLD, as she was previously intolerant to atorvastatin . At call on 10/26/23, patient was tolerating Ozempic  1 mg well and taking rosuvastatin  without issue.  Today, patient reports doing well. She has noticed that it has been harder for her to make healthy food choices recently and she feels her appetite/cravings have returned. She is interested in increasing the dose of Ozempic .    Care Team: Primary Care Provider: Borer Bascom RAMAN, NP ; Next Scheduled Visit: 12/17/23  Medication Access/Adherence  Current Pharmacy:  [id number] - [place], [place] - [address] AT [facility] [address] [place] [address] Phone: [phone] Fax: [phone]   Patient reports affordability concerns with their medications: No  Patient reports access/transportation concerns to their pharmacy: No  Patient reports adherence concerns with their medications:  No      Diabetes:  Current medications: Ozempic  1 mg weekly   Denies abdominal pain, nausea, vomiting with Ozempic . She is not feeling any appetite suppression, would like to increase to Ozempic  2 mg weekly.  Still eating 3 meals/day + snakcs  Current glucose readings: Got a Reli-On meter from pharmacy (was not able to fill Accu chek - too soon on insurance) - recalls recent FBG of 121 mg/dL, 891 mg/dL, 883 mg/dL  Patient denies hypoglycemic s/sx including dizziness, shakiness, sweating. Patient denies hyperglycemic symptoms including polyuria, polydipsia, polyphagia, nocturia, neuropathy. Reports stable blurred vision.  Current meal patterns: Tries to eat 3 meals per day and one snack. She is still tempted by sweets, can be hard to make healthy food choices, but she likes eating healthy foods. - Breakfast: eggs, turkey bacon, fruit - Lunch: salad, crab meat - Vegetables: squash, zucchini, okra - has been trying to fill up on these - Snacks: at night she has grapes, orange, strawberries. Trying to stay away from cake and ice cream.  - Drinks: Stays hydrated with water throughout the day, occasionally having pineapple juice. Also has gatorade (zero sugar, and regular)  Current physical activity: Has increased exercise recently - walking in the mornings, has increased up to 1.5 miles at a time.  Current medication access support: commercial insurance  Hyperlipidemia/ASCVD Risk Reduction  Current lipid lowering medications: rosuvastatin  5 mg daily  Medications tried in the past: atorvastatin  20 mg daily (HA, nausea)  Clinical ASCVD: No  The 10-year ASCVD risk score (Arnett DK, et al., 2019) is: 2.9%   Values used to calculate the score:     Age: 23 years     Clincally relevant sex: Female     Is Non-Hispanic African American: Yes     Diabetic: Yes  Tobacco smoker: No     Systolic Blood Pressure: 116 mmHg     Is BP treated: No     HDL Cholesterol: 38 mg/dL     Total Cholesterol: 196 mg/dL    Objective:  BP Readings from Last 3 Encounters:  08/23/23 116/67  08/14/23 113/75  05/19/23 126/84    Lab Results  Component Value Date   HGBA1C 8.0 (A) 08/23/2023   HGBA1C 10.7 (A)  09/28/2022   HGBA1C 10.6 (A) 12/30/2021       Latest Ref Rng & Units 08/23/2023    3:45 PM 09/28/2022    3:14 PM 05/25/2021    4:18 PM  BMP  Glucose 70 - 99 mg/dL 860  802  870   BUN 6 - 24 mg/dL 7  6  7    Creatinine 0.57 - 1.00 mg/dL 9.20  9.24  9.23   BUN/Creat Ratio 9 - 23 9  8  9    Sodium 134 - 144 mmol/L 137  139  139   Potassium 3.5 - 5.2 mmol/L 4.3  4.0  4.8   Chloride 96 - 106 mmol/L 103  102  102   CO2 20 - 29 mmol/L 17  23  25    Calcium  8.7 - 10.2 mg/dL 9.7  8.9  9.7     Lab Results  Component Value Date   CHOL 196 08/23/2023   HDL 38 (L) 08/23/2023   LDLCALC 119 (H) 08/23/2023   TRIG 223 (H) 08/23/2023   CHOLHDL 5.2 (H) 08/23/2023    Medications Reviewed Today     Reviewed by Brinda Lorain SQUIBB, RPH (Pharmacist) on 12/07/23 at 1053  Med List Status: <None>   Medication Order Taking? Sig Documenting Provider Last Dose Status Informant  Accu-Chek Softclix Lancets lancets [id number]  Use as instructed to check blood sugar up to twice daily Nichols, Tonya S, NP  Active   Blood Glucose Monitoring Suppl (ACCU-CHEK GUIDE ME) w/Device KIT [id number]  Use as directed to monitor blood sugar Oley Bascom RAMAN, NP  Active   cetirizine  (ZYRTEC ) 10 MG tablet [id number]  Take 1 tablet (10 mg total) by mouth daily. Oley Bascom RAMAN, NP  Active   ferrous sulfate  325 (65 FE) MG EC tablet [id number]  Take 1 tablet (325 mg total) by mouth daily. Myrna Salines M, NP  Expired 12/30/21 2359   glucose blood (ACCU-CHEK GUIDE TEST) test strip [id number]  Use as instructed to monitor blood sugar up to twice daily Nichols, Tonya S, NP  Active   rosuvastatin  (CRESTOR ) 5 MG tablet [id number] Yes Take 1 tablet (5 mg total) by mouth daily. Oley Bascom RAMAN, NP  Active    Discontinued 12/07/23 1053 (Dose change)   Semaglutide , 2 MG/DOSE, (OZEMPIC , 2 MG/DOSE,) 8 MG/3ML SOPN [id number] Yes Inject 2 mg into the skin once a week. Oley Bascom RAMAN, NP  Active   tiZANidine  (ZANAFLEX ) 4 MG tablet [id number]  TAKE 1  TABLET BY MOUTH EVERY 6 HOURS AS NEEDED FOR MUSCLE SPASMS. Oley Bascom RAMAN, NP  Active     Discontinued 09/24/19 1131   traZODone  (DESYREL ) 100 MG tablet [id number]  TAKE 1 TABLET BY MOUTH EVERYDAY AT BEDTIME Nichols, Tonya S, NP  Active   Med List Note Michaeleen Cathlyn LABOR, [place] 02/01/12 1646): Verified with RX, she has not filled since 05/2011.               Assessment/Plan:   Diabetes: - Currently uncontrolled with most recent A1C of 8.0% above goal <7%, but  FBG appear to be consistently at goal. No PPG information to review. Patient would like to increase Ozempic  to 2 mg weekly to aid her in making healthy food and lifestyle choices, as she feels she is no longer feeling the effects of the 1 mg dose. Discussed that we want her to continue having a regular appetite, and eating regularly throughout the day. She expresses understanding. Counseled on diet/lifestyle today. - Last UACR July 2025: 11 mg/g - Reviewed long term cardiovascular and renal outcomes of uncontrolled blood sugar - Reviewed goal A1c, goal fasting, and goal 2 hour post prandial glucose - Reviewed dietary modifications including  utilizing the healthy plate method, limiting portion size of carbohydrate foods, increasing intake of protein and non-starchy vegetables. Counseled patient to stay hydrated with water throughout the day. - Reviewed lifestyle modifications including: aiming for 150 minutes of moderate intensity exercise every week.  - Recommend to increase Ozempic  to 2 mg weekly at next fill - Advised to check FBG and 2 hr PPG a couple times per week - Next A1C due now   Hyperlipidemia/ASCVD Risk Reduction: - Currently uncontrolled with most recent LDL-C of 119 mg/dL above goal < 70 mg/dL given U7IF + comorbidities. Moderate intensity statin indicated. Appropriate to continue current regimen, given improved tolerability with rosuvastatin . - Reviewed long term complications of uncontrolled cholesterol - Recommend to  continue rosuvastatin  5 mg daily  - Recheck lipid panel at upcoming PCP appt to assess efficacy of rosuvastatin    Patient verbalized understanding of treatment plan.    Follow Up Plan:  Pharmacist 02/08/23 PCP clinic visit 12/17/23   Lorain Baseman, PharmD [facility] [phone]

## 2023-12-17 ENCOUNTER — Encounter: Payer: Self-pay | Admitting: Nurse Practitioner

## 2023-12-17 ENCOUNTER — Ambulatory Visit: Payer: Self-pay | Admitting: Nurse Practitioner

## 2023-12-17 VITALS — BP 113/69 | HR 80 | Wt 224.0 lb

## 2023-12-17 DIAGNOSIS — N926 Irregular menstruation, unspecified: Secondary | ICD-10-CM

## 2023-12-17 DIAGNOSIS — R102 Pelvic and perineal pain unspecified side: Secondary | ICD-10-CM | POA: Diagnosis not present

## 2023-12-17 DIAGNOSIS — E1165 Type 2 diabetes mellitus with hyperglycemia: Secondary | ICD-10-CM | POA: Diagnosis not present

## 2023-12-17 DIAGNOSIS — G4700 Insomnia, unspecified: Secondary | ICD-10-CM

## 2023-12-17 DIAGNOSIS — Z1231 Encounter for screening mammogram for malignant neoplasm of breast: Secondary | ICD-10-CM

## 2023-12-17 LAB — POCT GLYCOSYLATED HEMOGLOBIN (HGB A1C): Hemoglobin A1C: 7.2 % — AB (ref 4.0–5.6)

## 2023-12-17 MED ORDER — TRAZODONE HCL 100 MG PO TABS: 100.0000 mg | ORAL_TABLET | Freq: Every evening | ORAL | 2 refills | Status: AC | PRN

## 2023-12-17 MED ORDER — TIZANIDINE HCL 4 MG PO TABS: 4.0000 mg | ORAL_TABLET | Freq: Four times a day (QID) | ORAL | 0 refills | Status: AC | PRN

## 2023-12-17 MED ORDER — IBUPROFEN 600 MG PO TABS
600.0000 mg | ORAL_TABLET | Freq: Three times a day (TID) | ORAL | 0 refills | Status: AC | PRN
Start: 2023-12-17 — End: ?

## 2023-12-17 NOTE — Progress Notes (Signed)
 Subjective   Patient ID: [name], female    DOB: 09-07-1980, 43 y.o.   MRN: [id number]  Chief Complaint  Patient presents with   Follow-up    Abdominal pain, A1C, hot flashes, lipids, irregular cycles     Referring provider: Oley Bascom RAMAN, NP  [name] is a 43 y.o. female with Past Medical History: No date: Anemia 03/2019: Anxiety No date: Diabetes mellitus without complication (HCC) 04/07/2019: Insomnia No date: Kidney stone 12/2019: Low iron stores No date: Migraine 03/2019: Vitamin D  deficiency  HPI  [name] is a 43 y.o. female with past medical history of uncontrolled type 2 diabetes, obesity, anxiety, insomnia presents for physical and follow-up chronic medical conditions.    Type 2 diabetes.  Currently not taking diabetic medications. A1C is 7.2.  Does not follow diabetic diet and does not exercise.  She is interested in losing weight.   Will increase ozempic  to 1 mg weekly.  Will place a referral for pharmacy for medication management.    Note: Patient is having irregular menstrual cycles with heavy bleeding.  Her menstrual cycles are coming every 2 weeks now.  She does need a referral to OB/GYN today.  Denies f/c/s, n/v/d, hemoptysis, PND, leg swelling. Denies chest pain or edema.     No Known Allergies  Immunization History  Administered Date(s) Administered   PFIZER(Purple Top)SARS-COV-2 Vaccination 11/26/2019   Tdap 12/30/2021    Tobacco History: Social History   Tobacco Use  Smoking Status Never   Passive exposure: Never  Smokeless Tobacco Never   Counseling given: Not Answered   Outpatient Encounter Medications as of 12/17/2023  Medication Sig   Accu-Chek Softclix Lancets lancets Use as instructed to check blood sugar up to twice daily   Blood Glucose Monitoring Suppl (ACCU-CHEK GUIDE ME) w/Device KIT Use as directed to monitor blood sugar   cetirizine  (ZYRTEC ) 10 MG tablet Take 1 tablet (10 mg total) by mouth  daily.   ferrous sulfate  325 (65 FE) MG EC tablet Take 1 tablet (325 mg total) by mouth daily.   glucose blood (ACCU-CHEK GUIDE TEST) test strip Use as instructed to monitor blood sugar up to twice daily   ibuprofen  (ADVIL ) 600 MG tablet Take 1 tablet (600 mg total) by mouth every 8 (eight) hours as needed.   rosuvastatin  (CRESTOR ) 5 MG tablet Take 1 tablet (5 mg total) by mouth daily.   Semaglutide , 2 MG/DOSE, (OZEMPIC , 2 MG/DOSE,) 8 MG/3ML SOPN Inject 2 mg into the skin once a week.   [DISCONTINUED] tiZANidine  (ZANAFLEX ) 4 MG tablet TAKE 1 TABLET BY MOUTH EVERY 6 HOURS AS NEEDED FOR MUSCLE SPASMS.   [DISCONTINUED] traZODone  (DESYREL ) 100 MG tablet TAKE 1 TABLET BY MOUTH EVERYDAY AT BEDTIME   tiZANidine  (ZANAFLEX ) 4 MG tablet Take 1 tablet (4 mg total) by mouth every 6 (six) hours as needed for muscle spasms.   traZODone  (DESYREL ) 100 MG tablet Take 1 tablet (100 mg total) by mouth at bedtime as needed for sleep.   [DISCONTINUED] topiramate  (TOPAMAX ) 25 MG tablet TAKE 1 TABLET BY MOUTH TWICE A DAY   No facility-administered encounter medications on file as of 12/17/2023.    Review of Systems  Review of Systems  Constitutional: Negative.   HENT: Negative.    Cardiovascular: Negative.   Gastrointestinal: Negative.   Allergic/Immunologic: Negative.   Neurological: Negative.   Psychiatric/Behavioral: Negative.       Objective:   BP 113/69 (BP Location: Right Arm, Patient Position: Sitting, Cuff  Size: Large)   Pulse 80   Wt 224 lb (101.6 kg)   SpO2 97%   BMI 35.08 kg/m   Wt Readings from Last 5 Encounters:  12/17/23 224 lb (101.6 kg)  08/23/23 225 lb 3.2 oz (102.2 kg)  08/14/23 210 lb (95.3 kg)  12/05/22 228 lb (103.4 kg)  09/28/22 222 lb (100.7 kg)     Physical Exam Vitals and nursing note reviewed.  Constitutional:      General: She is not in acute distress.    Appearance: She is well-developed.  Cardiovascular:     Rate and Rhythm: Normal rate and regular rhythm.   Pulmonary:     Effort: Pulmonary effort is normal.     Breath sounds: Normal breath sounds.  Neurological:     Mental Status: She is alert and oriented to person, place, and time.       Assessment & Plan:   Uncontrolled type 2 diabetes mellitus with hyperglycemia (HCC) -     POCT glycosylated hemoglobin (Hb A1C)  Irregular periods/menstrual cycles -     Ambulatory referral to Obstetrics / Gynecology  Pelvic pain -     Ambulatory referral to Obstetrics / Gynecology  Insomnia, unspecified type -     traZODone  HCl; Take 1 tablet (100 mg total) by mouth at bedtime as needed for sleep.  Dispense: 90 tablet; Refill: 2  Encounter for screening mammogram for malignant neoplasm of breast -     3D Screening Mammogram, Left and Right  Other orders -     tiZANidine  HCl; Take 1 tablet (4 mg total) by mouth every 6 (six) hours as needed for muscle spasms.  Dispense: 30 tablet; Refill: 0 -     Ibuprofen ; Take 1 tablet (600 mg total) by mouth every 8 (eight) hours as needed.  Dispense: 30 tablet; Refill: 0     Return in about 3 months (around 03/18/2024).   Bascom GORMAN Borer, NP 12/17/2023

## 2023-12-20 ENCOUNTER — Telehealth: Payer: Self-pay | Admitting: Nurse Practitioner

## 2023-12-20 NOTE — Telephone Encounter (Signed)
 Copied from CRM #[id number]. Topic: Referral - Question >> Dec 18, 2023  2:53 PM [name] wrote: Reason for CRM: Patient calling in wanting to know if a  a diagnostic mammogram & ultrasound on right breast could be sent and not a routine mammogram. Please call to confim

## 2023-12-20 NOTE — Telephone Encounter (Signed)
 Copied from CRM #[id number]. Topic: Referral - Question >> Dec 18, 2023  2:53 PM Deaijah H wrote: Reason for CRM: Patient calling in wanting to know if a  a diagnostic mammogram & ultrasound on right breast could be sent and not a routine mammogram. Please call to confim

## 2023-12-25 NOTE — Telephone Encounter (Signed)
 Per [facility]  Comments  [employer] PF; 11/16/2020@[facility] - PT SHOULD HAVE CAME BACK FOR DIAG MAMMO AND U/S FOR FOCAL ASYMM?? EPIC ORDER    Kh

## 2023-12-26 ENCOUNTER — Other Ambulatory Visit: Payer: Self-pay | Admitting: Nurse Practitioner

## 2024-01-02 ENCOUNTER — Other Ambulatory Visit: Payer: Self-pay

## 2024-01-02 ENCOUNTER — Other Ambulatory Visit: Payer: Self-pay | Admitting: Nurse Practitioner

## 2024-01-02 DIAGNOSIS — Z1231 Encounter for screening mammogram for malignant neoplasm of breast: Secondary | ICD-10-CM

## 2024-01-02 DIAGNOSIS — N6489 Other specified disorders of breast: Secondary | ICD-10-CM

## 2024-01-02 NOTE — Telephone Encounter (Signed)
 Done [facility]

## 2024-01-09 ENCOUNTER — Encounter (HOSPITAL_BASED_OUTPATIENT_CLINIC_OR_DEPARTMENT_OTHER): Admitting: Certified Nurse Midwife

## 2024-01-12 ENCOUNTER — Other Ambulatory Visit: Payer: Self-pay | Admitting: Nurse Practitioner

## 2024-01-15 NOTE — Telephone Encounter (Signed)
 Please advise [facility]

## 2024-02-01 ENCOUNTER — Other Ambulatory Visit

## 2024-02-05 ENCOUNTER — Other Ambulatory Visit

## 2024-02-05 ENCOUNTER — Inpatient Hospital Stay: Admission: RE | Admit: 2024-02-05 | Source: Ambulatory Visit

## 2024-02-06 ENCOUNTER — Other Ambulatory Visit (INDEPENDENT_AMBULATORY_CARE_PROVIDER_SITE_OTHER)

## 2024-02-06 DIAGNOSIS — E1165 Type 2 diabetes mellitus with hyperglycemia: Secondary | ICD-10-CM

## 2024-02-06 NOTE — Progress Notes (Signed)
 "  02/06/2024 Name: [name] MRN: [id number] DOB: 01/17/1981  Chief Complaint  Patient presents with   Diabetes   Hyperlipidemia    [name] is a 44 y.o. year old female who presented for a telephone visit.   They were referred to the pharmacist by their PCP for assistance in managing diabetes. PMH includes T2DM.   Subjective: Patient was seen by PCP, Bascom Borer, NP, on 08/23/23. At last visit, BP was well controlled. A1C imprvoed from 10.7% to 8.0% on Ozempic  0.5 mg weekly. She was instructed to increase to Ozempic  1 mg weekly for further glycemic control and weight loss. She was engaged by pharmacy via telephone on 09/14/23 and reported she had just finished Ozempic  0.5 mg weekly and planned to increase to 1 mg as prescribed. She was also instructed to start rosuvastatin  5 mg daily for HLD, as she was previously intolerant to atorvastatin . At call on 10/26/23, patient was tolerating Ozempic  1 mg well and taking rosuvastatin  without issue. At pharmacy call on 12/07/23, we increased Ozempic  to 2 mg weekly. She saw PCP on 12/17/23. A1C had improved to 7.2%.   Today, patient reports doing well. Tolerating Ozempic  2 mg weekly without issue, and reports she is noticing weight loss.Says she is down to 211 lbs at home (224 lbs at last OV on 12/17/23).   Care Team: Primary Care Provider: Borer Bascom RAMAN, NP ; Next Scheduled Visit: 12/17/23  Medication Access/Adherence  Current Pharmacy:  [id number] - [place], [place] - [address] AT [facility] [address] [place] [address] Phone: [phone] Fax: [phone]   Patient reports affordability concerns with their medications: No  Patient reports access/transportation concerns to their pharmacy: No  Patient reports adherence concerns with their medications:  No      Diabetes:  Current medications: Ozempic  2 mg weekly   Denies abdominal pain, nausea, vomiting with  Ozempic .  Still eating 3 meals/day + snacks.  Current glucose readings: Got a Reli-On meter from pharmacy (was not able to fill Accu chek - too soon on insurance)   Patient denies hypoglycemic s/sx including dizziness, shakiness, sweating. Patient denies hyperglycemic symptoms including polyuria, polydipsia, polyphagia, nocturia, neuropathy.   Current meal patterns: Tries to eat 3 meals per day and one snack. She is still tempted by sweets, can be hard to make healthy food choices, but she likes eating healthy foods. - Breakfast: eggs, turkey bacon, fruit - Lunch: salad, crab meat - Vegetables: squash, zucchini, okra - has been trying to fill up on these - Snacks: at night she has grapes, orange, strawberries. Trying to stay away from cake and ice cream.  - Drinks: Stays hydrated with water throughout the day, occasionally having pineapple juice. Also has gatorade (zero sugar, and regular)  Current physical activity: Has increased exercise recently - walking in the mornings, has increased up to 1.5 miles at a time.  Current medication access support: commercial insurance  Hyperlipidemia/ASCVD Risk Reduction  Current lipid lowering medications: rosuvastatin  5 mg daily  Medications tried in the past: atorvastatin  20 mg daily (HA, nausea)  Clinical ASCVD: No  The 10-year ASCVD risk score (Arnett DK, et al., 2019) is: 2.6%   Values used to calculate the score:     Age: 66 years     Clinically relevant sex: Female     Is Non-Hispanic African American: Yes     Diabetic: Yes     Tobacco smoker: No  Systolic Blood Pressure: 113 mmHg     Is BP treated: No     HDL Cholesterol: 38 mg/dL     Total Cholesterol: 196 mg/dL    Objective:  BP Readings from Last 3 Encounters:  12/17/23 113/69  08/23/23 116/67  08/14/23 113/75    Lab Results  Component Value Date   HGBA1C 7.2 (A) 12/17/2023   HGBA1C 8.0 (A) 08/23/2023   HGBA1C 10.7 (A) 09/28/2022       Latest Ref Rng & Units  08/23/2023    3:45 PM 09/28/2022    3:14 PM 05/25/2021    4:18 PM  BMP  Glucose 70 - 99 mg/dL 860  802  870   BUN 6 - 24 mg/dL 7  6  7    Creatinine 0.57 - 1.00 mg/dL 9.20  9.24  9.23   BUN/Creat Ratio 9 - 23 9  8  9    Sodium 134 - 144 mmol/L 137  139  139   Potassium 3.5 - 5.2 mmol/L 4.3  4.0  4.8   Chloride 96 - 106 mmol/L 103  102  102   CO2 20 - 29 mmol/L 17  23  25    Calcium  8.7 - 10.2 mg/dL 9.7  8.9  9.7     Lab Results  Component Value Date   CHOL 196 08/23/2023   HDL 38 (L) 08/23/2023   LDLCALC 119 (H) 08/23/2023   TRIG 223 (H) 08/23/2023   CHOLHDL 5.2 (H) 08/23/2023    Medications Reviewed Today     Reviewed by Brinda Lorain SQUIBB, RPH-CPP (Pharmacist) on 02/06/24 at 1031  Med List Status: <None>   Medication Order Taking? Sig Documenting Provider Last Dose Status Informant  Accu-Chek Softclix Lancets lancets [id number]  Use as instructed to check blood sugar up to twice daily Oley Bascom RAMAN, NP  Active     Discontinued 02/06/24 1031 (Change in therapy)            Med Note>> Brinda Lorain SQUIBB, RPH-CPP   02/06/2024 10:31 AM switch to rosuvastatin     Blood Glucose Monitoring Suppl (ACCU-CHEK GUIDE ME) w/Device KIT [id number]  Use as directed to monitor blood sugar Oley Bascom RAMAN, NP  Active   cetirizine  (ZYRTEC ) 10 MG tablet [id number]  Take 1 tablet (10 mg total) by mouth daily. Oley Bascom RAMAN, NP  Active   ferrous sulfate  325 (65 FE) MG EC tablet [id number]  Take 1 tablet (325 mg total) by mouth daily. Myrna Salines M, NP  Expired 12/17/23 2359   glucose blood (ACCU-CHEK GUIDE TEST) test strip [id number]  Use as instructed to monitor blood sugar up to twice daily Nichols, Tonya S, NP  Active   ibuprofen  (ADVIL ) 600 MG tablet [id number]  TAKE 1 TABLET BY MOUTH EVERY 8 HOURS AS NEEDED. Oley Bascom RAMAN, NP  Active   rosuvastatin  (CRESTOR ) 5 MG tablet [id number] Yes Take 1 tablet (5 mg total) by mouth daily. Oley Bascom RAMAN, NP  Active   Semaglutide , 2 MG/DOSE, (OZEMPIC , 2 MG/DOSE,) 8  MG/3ML SOPN [id number] Yes Inject 2 mg into the skin once a week. Oley Bascom RAMAN, NP  Active   tiZANidine  (ZANAFLEX ) 4 MG tablet [id number]  Take 1 tablet (4 mg total) by mouth every 6 (six) hours as needed for muscle spasms. Oley Bascom RAMAN, NP  Active     Discontinued 09/24/19 1131   traZODone  (DESYREL ) 100 MG tablet [id number]  Take 1 tablet (100 mg total) by mouth at bedtime as needed for sleep. Oley,  Bascom RAMAN, NP  Active   Med List Note Michaeleen Cathlyn LABOR, [place] 02/01/12 1646): Verified with RX, she has not filled since 05/2011.               Assessment/Plan:   Diabetes: - Currently uncontrolled with most recent A1C of 7.2% above goal <7%. Commended patient for improvement in diet and lifestyle. Expect next A1C to be < 7% given recent increase in Ozempic  to 2 mg weekly and lifestyle changes. If additional glycemic control needed in the future, could consider switch to Mounjaro and/or addition of SGLT2i. - Last UACR July 2025: 11 mg/g - Reviewed long term cardiovascular and renal outcomes of uncontrolled blood sugar - Reviewed goal A1c, goal fasting, and goal 2 hour post prandial glucose - Reviewed dietary modifications including  utilizing the healthy plate method, limiting portion size of carbohydrate foods, increasing intake of protein and non-starchy vegetables. Counseled patient to stay hydrated with water throughout the day. - Reviewed lifestyle modifications including: aiming for 150 minutes of moderate intensity exercise every week.  - Recommend to continue Ozempic  2 mg weekly - Advised to check FBG and 2 hr PPG a couple times per week - Next A1C due 03/18/24   Hyperlipidemia/ASCVD Risk Reduction: - Currently uncontrolled with most recent LDL-C of 119 mg/dL above goal < 70 mg/dL given U7IF + comorbidities. Moderate intensity statin indicated. Appropriate to continue current regimen, given improved tolerability with rosuvastatin . Patient due for repeat lipid panel following  initiation of rosuvastatin . - Reviewed long term complications of uncontrolled cholesterol - Recommend to continue rosuvastatin  5 mg daily  - Recheck lipid panel at upcoming PCP appt to assess efficacy of rosuvastatin    Patient verbalized understanding of treatment plan.    Follow Up Plan:  Pharmacist telephone 04/09/24 PCP clinic visit 03/26/24   Lorain Baseman, PharmD [facility] [phone]   "

## 2024-02-08 ENCOUNTER — Other Ambulatory Visit

## 2024-02-27 ENCOUNTER — Other Ambulatory Visit: Payer: Self-pay | Admitting: Nurse Practitioner

## 2024-02-27 DIAGNOSIS — R928 Other abnormal and inconclusive findings on diagnostic imaging of breast: Secondary | ICD-10-CM

## 2024-02-27 DIAGNOSIS — N6489 Other specified disorders of breast: Secondary | ICD-10-CM

## 2024-02-27 DIAGNOSIS — Z1231 Encounter for screening mammogram for malignant neoplasm of breast: Secondary | ICD-10-CM

## 2024-03-04 ENCOUNTER — Encounter

## 2024-03-04 ENCOUNTER — Other Ambulatory Visit

## 2024-03-26 ENCOUNTER — Ambulatory Visit: Payer: Self-pay | Admitting: Nurse Practitioner

## 2024-03-27 ENCOUNTER — Encounter

## 2024-03-27 ENCOUNTER — Other Ambulatory Visit

## 2024-04-09 ENCOUNTER — Other Ambulatory Visit: Payer: Self-pay
